# Patient Record
Sex: Female | Born: 1955 | Race: White | Hispanic: No | Marital: Married | Smoking: Never smoker
Health system: Southern US, Community
[De-identification: ages and names within clinical notes are randomized; demographics above are authoritative.]

## PROBLEM LIST (undated history)

## (undated) DIAGNOSIS — F419 Anxiety disorder, unspecified: Secondary | ICD-10-CM

## (undated) DIAGNOSIS — I10 Essential (primary) hypertension: Secondary | ICD-10-CM

## (undated) DIAGNOSIS — Z8659 Personal history of other mental and behavioral disorders: Secondary | ICD-10-CM

## (undated) DIAGNOSIS — G500 Trigeminal neuralgia: Secondary | ICD-10-CM

## (undated) DIAGNOSIS — R079 Chest pain, unspecified: Secondary | ICD-10-CM

## (undated) DIAGNOSIS — K219 Gastro-esophageal reflux disease without esophagitis: Secondary | ICD-10-CM

## (undated) DIAGNOSIS — I209 Angina pectoris, unspecified: Secondary | ICD-10-CM

## (undated) DIAGNOSIS — Z87442 Personal history of urinary calculi: Secondary | ICD-10-CM

## (undated) DIAGNOSIS — E785 Hyperlipidemia, unspecified: Secondary | ICD-10-CM

## (undated) DIAGNOSIS — J45909 Unspecified asthma, uncomplicated: Secondary | ICD-10-CM

## (undated) DIAGNOSIS — I499 Cardiac arrhythmia, unspecified: Secondary | ICD-10-CM

## (undated) DIAGNOSIS — M5126 Other intervertebral disc displacement, lumbar region: Secondary | ICD-10-CM

## (undated) DIAGNOSIS — M75102 Unspecified rotator cuff tear or rupture of left shoulder, not specified as traumatic: Secondary | ICD-10-CM

## (undated) DIAGNOSIS — M1990 Unspecified osteoarthritis, unspecified site: Secondary | ICD-10-CM

## (undated) DIAGNOSIS — E119 Type 2 diabetes mellitus without complications: Secondary | ICD-10-CM

## (undated) HISTORY — PX: BRAIN SURGERY: SHX531

## (undated) HISTORY — DX: Chest pain, unspecified: R07.9

## (undated) HISTORY — PX: CARDIAC CATHETERIZATION: SHX172

## (undated) HISTORY — DX: Other intervertebral disc displacement, lumbar region: M51.26

## (undated) HISTORY — DX: Unspecified rotator cuff tear or rupture of left shoulder, not specified as traumatic: M75.102

## (undated) HISTORY — PX: BACK SURGERY: SHX140

## (undated) HISTORY — DX: Hyperlipidemia, unspecified: E78.5

## (undated) HISTORY — PX: ABDOMINAL HYSTERECTOMY: SHX81

---

## 1997-08-11 HISTORY — PX: OTHER SURGICAL HISTORY: SHX169

## 2003-04-19 HISTORY — PX: LUMBAR LAMINECTOMY/DECOMPRESSION MICRODISCECTOMY: SHX5026

## 2003-05-09 ENCOUNTER — Ambulatory Visit (HOSPITAL_COMMUNITY): Admission: RE | Admit: 2003-05-09 | Discharge: 2003-05-10 | Payer: Self-pay | Admitting: Neurosurgery

## 2003-05-09 ENCOUNTER — Encounter: Payer: Self-pay | Admitting: Neurosurgery

## 2003-10-26 ENCOUNTER — Other Ambulatory Visit: Payer: Self-pay

## 2004-11-21 ENCOUNTER — Ambulatory Visit: Payer: Self-pay | Admitting: Family Medicine

## 2004-12-04 ENCOUNTER — Ambulatory Visit: Payer: Self-pay | Admitting: Unknown Physician Specialty

## 2005-05-26 ENCOUNTER — Ambulatory Visit: Payer: Self-pay | Admitting: Family Medicine

## 2006-08-20 ENCOUNTER — Ambulatory Visit: Payer: Self-pay | Admitting: Unknown Physician Specialty

## 2006-12-23 ENCOUNTER — Ambulatory Visit: Payer: Self-pay | Admitting: Family Medicine

## 2007-01-26 ENCOUNTER — Emergency Department: Payer: Self-pay | Admitting: Emergency Medicine

## 2007-11-16 ENCOUNTER — Ambulatory Visit: Payer: Self-pay

## 2010-02-06 ENCOUNTER — Emergency Department (HOSPITAL_COMMUNITY): Admission: EM | Admit: 2010-02-06 | Discharge: 2010-02-06 | Payer: Self-pay | Admitting: Emergency Medicine

## 2010-02-14 ENCOUNTER — Ambulatory Visit: Payer: Self-pay | Admitting: Family Medicine

## 2010-12-27 NOTE — Op Note (Signed)
NAME:  Amanda Lyons, Amanda Lyons                          ACCOUNT NO.:  [id number]   MEDICAL RECORD NO.:  [id number]                   PATIENT TYPE:  OIB   LOCATION:  2899                                 FACILITY:  [facility]   PHYSICIAN:  Reinaldo Meeker, M.D.              DATE OF BIRTH:  Nov 13, 1955   DATE OF PROCEDURE:  05/09/2003  DATE OF DISCHARGE:                                 OPERATIVE REPORT   PREOPERATIVE DIAGNOSIS:  Herniated disk L5-S1, right.   POSTOPERATIVE DIAGNOSIS:  Herniated disk L5-S1, right.   PROCEDURE:  Right L5-S1 intralaminar laminotomy for excision of herniated  disk with the operating microscope.   SECONDARY PROCEDURE:  Microdissection of L5-S1 disk and S1 nerve root.   SURGEON:  Reinaldo Meeker, M.D.   ASSISTANT:  Kathaleen Maser. Pool, M.D.   DESCRIPTION OF PROCEDURE:  Having been placed in the prone position, the  patient's back was prepped and draped in the usual sterile fashion.  A  localizing x-ray was taken prior to incision, to identify the appropriate  level.  A midline incision was made about the spinous processes of L5 and  S1.  A single curved incision was carried out in the spinous processes.  A  subperiosteal dissection was then carried out on the right side of the  spinous processes and lamina, and the McCullough self-retaining retractor  was placed for exposure.  A second x-ray showed an approach at the  appropriate level.  Using the high-speed drill, the inferior one-third of  the L5 lamina and the medial one-third of the facet joint were removed.  A  drill was then used to remove the superior one-third of the S1 lamina.  Residual bone and ligament of flavum were removed in a piece-meal fashion.  The microscope was draped and brought into the field, and used for the  remainder of the case.  Using a microdissection technique, the lateral  aspect of the thecal sac and the S1 nerve root were identified.  Further  coagulation was carried out down to the floor  of the canal to identify the  L5-S1 disk, which was found to be focally herniated, beneath the nerve root.  After coagulating on the annulus, the annulus was incised with a #15 blade.  Using pituitary rongeurs and curets the right disk space clean-out was  carried out with attention to great care taken to avoid injury to the neural  level which was successfully done.  At this point, inspection was carried  out in all directions without any evidence of residual compression, and none  could be identified.  Large amounts of irrigation were carried out and the  bleeding controlled with bipolar coagulation and Gelfoam.  The wound was  then closed using interrupted Vicryl in the muscle, fascia, subcutaneous and  subcuticular tissues, and staples on the skin.  A sterile dressing was then  applied.   The patient was  then extubated and taken to the recovery room in stable  condition.                                                Reinaldo Meeker, M.D.    ROK/MEDQ  D:  05/09/2003  T:  05/09/2003  Job:  [id number]

## 2013-03-03 ENCOUNTER — Ambulatory Visit: Payer: Self-pay | Admitting: Family Medicine

## 2013-03-24 ENCOUNTER — Ambulatory Visit: Payer: Self-pay | Admitting: Neurosurgery

## 2013-03-30 ENCOUNTER — Other Ambulatory Visit: Payer: Self-pay | Admitting: Neurosurgery

## 2013-04-23 NOTE — Pre-Procedure Instructions (Signed)
Amanda Lyons  04/23/2013   Your procedure is scheduled on:  September 22  Report to [facility] Entrance "A" [address] at [employer] AM.  Call this number if you have problems the morning of surgery:[phone]   Remember:   Do not eat food or drink liquids after midnight.   Take these medicines the morning of surgery with A SIP OF WATER: Omeprazole, Oxycodone (if needed)   Do not take Aspirin, Aleve, Naproxen, Advil, Ibuprofen, Vitamin, Herbs, or Supplements starting today  Do not wear jewelry, make-up or nail polish.  Do not wear lotions, powders, or perfumes. You may wear deodorant.  Do not shave 48 hours prior to surgery. Men may shave face and neck.  Do not bring valuables to the hospital.  [facility] is not responsible                   for any belongings or valuables.  Contacts, dentures or bridgework may not be worn into surgery.  Leave suitcase in the car. After surgery it may be brought to your room.  For patients admitted to the hospital, checkout time is 11:00 AM the day of  discharge.   Patients discharged the day of surgery will not be allowed to drive  home.  Name and phone number of your driver: Family/ Friend  Special Instructions: Shower using CHG 2 nights before surgery and the night before surgery.  If you shower the day of surgery use CHG.  Use special wash - you have one bottle of CHG for all showers.  You should use approximately 1/3 of the bottle for each shower.   Please read over the following fact sheets that you were given: Pain Booklet, Coughing and Deep Breathing and Surgical Site Infection Prevention

## 2013-04-25 ENCOUNTER — Ambulatory Visit (HOSPITAL_COMMUNITY)
Admission: RE | Admit: 2013-04-25 | Discharge: 2013-04-25 | Disposition: A | Discharge status: Home / Self Care | Payer: No Typology Code available for payment source | Source: Ambulatory Visit | Attending: Anesthesiology | Admitting: Anesthesiology

## 2013-04-25 ENCOUNTER — Encounter (HOSPITAL_COMMUNITY)
Admission: RE | Admit: 2013-04-25 | Discharge: 2013-04-25 | Disposition: A | Discharge status: Home / Self Care | Payer: No Typology Code available for payment source | Source: Ambulatory Visit | Attending: Neurosurgery | Admitting: Neurosurgery

## 2013-04-25 ENCOUNTER — Encounter (HOSPITAL_COMMUNITY): Payer: Self-pay

## 2013-04-25 DIAGNOSIS — Z01818 Encounter for other preprocedural examination: Secondary | ICD-10-CM | POA: Insufficient documentation

## 2013-04-25 DIAGNOSIS — Z01810 Encounter for preprocedural cardiovascular examination: Secondary | ICD-10-CM | POA: Insufficient documentation

## 2013-04-25 DIAGNOSIS — Z01812 Encounter for preprocedural laboratory examination: Secondary | ICD-10-CM | POA: Insufficient documentation

## 2013-04-25 HISTORY — DX: Personal history of urinary calculi: Z87.442

## 2013-04-25 HISTORY — DX: Trigeminal neuralgia: G50.0

## 2013-04-25 HISTORY — DX: Essential (primary) hypertension: I10

## 2013-04-25 HISTORY — DX: Personal history of other mental and behavioral disorders: Z86.59

## 2013-04-25 HISTORY — DX: Gastro-esophageal reflux disease without esophagitis: K21.9

## 2013-04-25 LAB — SURGICAL PCR SCREEN: MRSA, PCR: NEGATIVE

## 2013-04-25 LAB — CBC WITH DIFFERENTIAL/PLATELET
Basophils Absolute: 0 10*3/uL (ref 0.0–0.1)
Eosinophils Absolute: 0.1 10*3/uL (ref 0.0–0.7)
Eosinophils Relative: 1 % (ref 0–5)
MCH: 29.5 pg (ref 26.0–34.0)
MCV: 89.3 fL (ref 78.0–100.0)
Platelets: 297 10*3/uL (ref 150–400)
RDW: 13.2 % (ref 11.5–15.5)

## 2013-04-25 LAB — BASIC METABOLIC PANEL
CO2: 29 mEq/L (ref 19–32)
Calcium: 10.1 mg/dL (ref 8.4–10.5)
Creatinine, Ser: 0.64 mg/dL (ref 0.50–1.10)
Glucose, Bld: 137 mg/dL — ABNORMAL HIGH (ref 70–99)

## 2013-05-01 MED ORDER — CEFAZOLIN SODIUM-DEXTROSE 2-3 GM-% IV SOLR
2.0000 g | INTRAVENOUS | Status: AC
  Administered 2013-05-02: 2 g via INTRAVENOUS
  Filled 2013-05-01: qty 50

## 2013-05-02 ENCOUNTER — Encounter (HOSPITAL_COMMUNITY)
Admission: RE | Disposition: A | Discharge status: Home / Self Care | Payer: Self-pay | Source: Ambulatory Visit | Attending: Neurosurgery

## 2013-05-02 ENCOUNTER — Ambulatory Visit (HOSPITAL_COMMUNITY)
Admission: RE | Admit: 2013-05-02 | Discharge: 2013-05-02 | Disposition: A | Discharge status: Home / Self Care | Payer: No Typology Code available for payment source | Source: Ambulatory Visit | Attending: Neurosurgery | Admitting: Neurosurgery

## 2013-05-02 ENCOUNTER — Ambulatory Visit (HOSPITAL_COMMUNITY): Payer: No Typology Code available for payment source | Admitting: Anesthesiology

## 2013-05-02 ENCOUNTER — Encounter (HOSPITAL_COMMUNITY): Payer: Self-pay | Admitting: Anesthesiology

## 2013-05-02 ENCOUNTER — Ambulatory Visit (HOSPITAL_COMMUNITY): Payer: No Typology Code available for payment source

## 2013-05-02 ENCOUNTER — Encounter (HOSPITAL_COMMUNITY): Payer: Self-pay | Admitting: *Deleted

## 2013-05-02 DIAGNOSIS — M5126 Other intervertebral disc displacement, lumbar region: Secondary | ICD-10-CM

## 2013-05-02 DIAGNOSIS — I10 Essential (primary) hypertension: Secondary | ICD-10-CM | POA: Insufficient documentation

## 2013-05-02 DIAGNOSIS — E7800 Pure hypercholesterolemia, unspecified: Secondary | ICD-10-CM | POA: Insufficient documentation

## 2013-05-02 HISTORY — PX: LUMBAR LAMINECTOMY/DECOMPRESSION MICRODISCECTOMY: SHX5026

## 2013-05-02 HISTORY — DX: Other intervertebral disc displacement, lumbar region: M51.26

## 2013-05-02 SURGERY — LUMBAR LAMINECTOMY/DECOMPRESSION MICRODISCECTOMY 1 LEVEL
Anesthesia: General | Site: Back | Laterality: Right | Wound class: Clean

## 2013-05-02 MED ORDER — PROPOFOL 10 MG/ML IV BOLUS
INTRAVENOUS | Status: DC | PRN
  Administered 2013-05-02: 80 mg via INTRAVENOUS

## 2013-05-02 MED ORDER — HYDROCHLOROTHIAZIDE 12.5 MG PO CAPS
12.5000 mg | ORAL_CAPSULE | Freq: Every day | ORAL | Status: DC
  Administered 2013-05-02: 12.5 mg via ORAL
  Filled 2013-05-02: qty 1

## 2013-05-02 MED ORDER — OXYCODONE HCL 5 MG PO TABS: 5.0000 mg | ORAL_TABLET | Freq: Once | ORAL | Status: DC | PRN

## 2013-05-02 MED ORDER — ARTIFICIAL TEARS OP OINT
TOPICAL_OINTMENT | OPHTHALMIC | Status: DC | PRN
  Administered 2013-05-02: 1 via OPHTHALMIC

## 2013-05-02 MED ORDER — OXYCODONE HCL 5 MG/5ML PO SOLN: 5.0000 mg | Freq: Once | ORAL | Status: DC | PRN

## 2013-05-02 MED ORDER — BUPIVACAINE HCL (PF) 0.25 % IJ SOLN
INTRAMUSCULAR | Status: DC | PRN
  Administered 2013-05-02: 10 mL

## 2013-05-02 MED ORDER — OXYCODONE-ACETAMINOPHEN 5-325 MG PO TABS: 0.5000 | ORAL_TABLET | ORAL | Status: DC | PRN

## 2013-05-02 MED ORDER — NEOSTIGMINE METHYLSULFATE 1 MG/ML IJ SOLN
INTRAMUSCULAR | Status: DC | PRN
  Administered 2013-05-02: 3 mg via INTRAVENOUS

## 2013-05-02 MED ORDER — PANTOPRAZOLE SODIUM 40 MG PO TBEC: 40.0000 mg | DELAYED_RELEASE_TABLET | Freq: Every day | ORAL | Status: DC

## 2013-05-02 MED ORDER — CEFAZOLIN SODIUM 1-5 GM-% IV SOLN
1.0000 g | Freq: Three times a day (TID) | INTRAVENOUS | Status: DC
  Administered 2013-05-02: 1 g via INTRAVENOUS
  Filled 2013-05-02 (×2): qty 50

## 2013-05-02 MED ORDER — FENTANYL CITRATE 0.05 MG/ML IJ SOLN
INTRAMUSCULAR | Status: DC | PRN
  Administered 2013-05-02: 50 ug via INTRAVENOUS
  Administered 2013-05-02: 100 ug via INTRAVENOUS

## 2013-05-02 MED ORDER — DEXAMETHASONE SODIUM PHOSPHATE 10 MG/ML IJ SOLN: 10.0000 mg | INTRAMUSCULAR | Status: DC

## 2013-05-02 MED ORDER — THROMBIN 5000 UNITS EX SOLR
CUTANEOUS | Status: DC | PRN
  Administered 2013-05-02 (×2): 5000 [IU] via TOPICAL

## 2013-05-02 MED ORDER — BENAZEPRIL HCL 20 MG PO TABS
20.0000 mg | ORAL_TABLET | Freq: Every day | ORAL | Status: DC
  Administered 2013-05-02: 20 mg via ORAL
  Filled 2013-05-02: qty 1

## 2013-05-02 MED ORDER — PHENOL 1.4 % MT LIQD
1.0000 | OROMUCOSAL | Status: DC | PRN
  Administered 2013-05-02: 1 via OROMUCOSAL
  Filled 2013-05-02: qty 177

## 2013-05-02 MED ORDER — SODIUM CHLORIDE 0.9 % IV SOLN: 250.0000 mL | INTRAVENOUS | Status: DC

## 2013-05-02 MED ORDER — HYDROMORPHONE HCL PF 1 MG/ML IJ SOLN
0.2500 mg | INTRAMUSCULAR | Status: DC | PRN
  Administered 2013-05-02 (×2): 0.5 mg via INTRAVENOUS

## 2013-05-02 MED ORDER — KETOROLAC TROMETHAMINE 30 MG/ML IJ SOLN
INTRAMUSCULAR | Status: DC | PRN
  Administered 2013-05-02: 30 mg via INTRAVENOUS

## 2013-05-02 MED ORDER — LACTATED RINGERS IV SOLN
INTRAVENOUS | Status: DC | PRN
  Administered 2013-05-02 (×2): via INTRAVENOUS

## 2013-05-02 MED ORDER — SODIUM CHLORIDE 0.9 % IJ SOLN: 3.0000 mL | INTRAMUSCULAR | Status: DC | PRN

## 2013-05-02 MED ORDER — GLYCOPYRROLATE 0.2 MG/ML IJ SOLN
INTRAMUSCULAR | Status: DC | PRN
  Administered 2013-05-02: 0.6 mg via INTRAVENOUS

## 2013-05-02 MED ORDER — SODIUM CHLORIDE 0.9 % IR SOLN
Status: DC | PRN
  Administered 2013-05-02: 08:00:00

## 2013-05-02 MED ORDER — SIMVASTATIN 20 MG PO TABS
20.0000 mg | ORAL_TABLET | Freq: Every day | ORAL | Status: DC
  Filled 2013-05-02: qty 1

## 2013-05-02 MED ORDER — ALUM & MAG HYDROXIDE-SIMETH 200-200-20 MG/5ML PO SUSP: 30.0000 mL | Freq: Four times a day (QID) | ORAL | Status: DC | PRN

## 2013-05-02 MED ORDER — PROMETHAZINE HCL 12.5 MG PO TABS: 12.5000 mg | ORAL_TABLET | Freq: Four times a day (QID) | ORAL | Status: DC | PRN

## 2013-05-02 MED ORDER — OXYCODONE-ACETAMINOPHEN 5-325 MG PO TABS
1.0000 | ORAL_TABLET | ORAL | Status: DC | PRN
  Administered 2013-05-02: 2 via ORAL
  Filled 2013-05-02: qty 2

## 2013-05-02 MED ORDER — SODIUM CHLORIDE 0.9 % IJ SOLN: 3.0000 mL | Freq: Two times a day (BID) | INTRAMUSCULAR | Status: DC

## 2013-05-02 MED ORDER — HYDROMORPHONE HCL PF 1 MG/ML IJ SOLN
INTRAMUSCULAR | Status: AC
  Filled 2013-05-02: qty 1

## 2013-05-02 MED ORDER — BENAZEPRIL-HYDROCHLOROTHIAZIDE 20-12.5 MG PO TABS
1.0000 | ORAL_TABLET | Freq: Every day | ORAL | Status: DC
Start: 2013-05-02 — End: 2013-05-02

## 2013-05-02 MED ORDER — HEMOSTATIC AGENTS (NO CHARGE) OPTIME
TOPICAL | Status: DC | PRN
  Administered 2013-05-02: 1 via TOPICAL

## 2013-05-02 MED ORDER — ONDANSETRON HCL 4 MG/2ML IJ SOLN: 4.0000 mg | Freq: Four times a day (QID) | INTRAMUSCULAR | Status: DC | PRN

## 2013-05-02 MED ORDER — ACETAMINOPHEN 650 MG RE SUPP: 650.0000 mg | RECTAL | Status: DC | PRN

## 2013-05-02 MED ORDER — ONDANSETRON HCL 4 MG/2ML IJ SOLN
INTRAMUSCULAR | Status: DC | PRN
  Administered 2013-05-02: 4 mg via INTRAVENOUS

## 2013-05-02 MED ORDER — SENNA 8.6 MG PO TABS: 1.0000 | ORAL_TABLET | Freq: Two times a day (BID) | ORAL | Status: DC

## 2013-05-02 MED ORDER — HYDROCODONE-ACETAMINOPHEN 5-325 MG PO TABS: 1.0000 | ORAL_TABLET | ORAL | Status: DC | PRN

## 2013-05-02 MED ORDER — ACETAMINOPHEN 325 MG PO TABS: 650.0000 mg | ORAL_TABLET | ORAL | Status: DC | PRN

## 2013-05-02 MED ORDER — 0.9 % SODIUM CHLORIDE (POUR BTL) OPTIME
TOPICAL | Status: DC | PRN
  Administered 2013-05-02: 1000 mL

## 2013-05-02 MED ORDER — CYCLOBENZAPRINE HCL 10 MG PO TABS: 10.0000 mg | ORAL_TABLET | Freq: Three times a day (TID) | ORAL | Status: DC | PRN

## 2013-05-02 MED ORDER — MENTHOL 3 MG MT LOZG
1.0000 | LOZENGE | OROMUCOSAL | Status: DC | PRN
  Filled 2013-05-02: qty 9

## 2013-05-02 MED ORDER — ONDANSETRON HCL 4 MG/2ML IJ SOLN
4.0000 mg | INTRAMUSCULAR | Status: DC | PRN
  Administered 2013-05-02 (×2): 4 mg via INTRAVENOUS
  Filled 2013-05-02 (×2): qty 2

## 2013-05-02 MED ORDER — MIDAZOLAM HCL 5 MG/5ML IJ SOLN
INTRAMUSCULAR | Status: DC | PRN
  Administered 2013-05-02: 2 mg via INTRAVENOUS

## 2013-05-02 MED ORDER — LIDOCAINE HCL (CARDIAC) 20 MG/ML IV SOLN
INTRAVENOUS | Status: DC | PRN
  Administered 2013-05-02: 80 mg via INTRAVENOUS

## 2013-05-02 MED ORDER — HYDROMORPHONE HCL PF 1 MG/ML IJ SOLN
0.5000 mg | INTRAMUSCULAR | Status: DC | PRN
  Administered 2013-05-02: 1 mg via INTRAVENOUS
  Filled 2013-05-02: qty 1

## 2013-05-02 MED ORDER — ROCURONIUM BROMIDE 100 MG/10ML IV SOLN
INTRAVENOUS | Status: DC | PRN
  Administered 2013-05-02: 50 mg via INTRAVENOUS

## 2013-05-02 MED ORDER — DEXAMETHASONE SODIUM PHOSPHATE 10 MG/ML IJ SOLN
INTRAMUSCULAR | Status: AC
  Administered 2013-05-02: 10 mg via INTRAVENOUS
  Filled 2013-05-02: qty 1

## 2013-05-02 MED ORDER — CYCLOBENZAPRINE HCL 10 MG PO TABS
10.0000 mg | ORAL_TABLET | Freq: Three times a day (TID) | ORAL | Status: DC | PRN
  Administered 2013-05-02: 10 mg via ORAL
  Filled 2013-05-02: qty 1

## 2013-05-02 SURGICAL SUPPLY — 54 items
BAG DECANTER FOR FLEXI CONT (MISCELLANEOUS) ×2 IMPLANT
BENZOIN TINCTURE PRP APPL 2/3 (GAUZE/BANDAGES/DRESSINGS) ×2 IMPLANT
BLADE SURG ROTATE 9660 (MISCELLANEOUS) IMPLANT
BRUSH SCRUB EZ PLAIN DRY (MISCELLANEOUS) ×2 IMPLANT
BUR CUTTER 7.0 ROUND (BURR) ×2 IMPLANT
CANISTER SUCTION 2500CC (MISCELLANEOUS) ×2 IMPLANT
CLOTH BEACON ORANGE TIMEOUT ST (SAFETY) ×2 IMPLANT
CONT SPEC 4OZ CLIKSEAL STRL BL (MISCELLANEOUS) ×2 IMPLANT
DECANTER SPIKE VIAL GLASS SM (MISCELLANEOUS) ×2 IMPLANT
DERMABOND ADHESIVE PROPEN (GAUZE/BANDAGES/DRESSINGS) ×1
DERMABOND ADVANCED (GAUZE/BANDAGES/DRESSINGS) ×1
DERMABOND ADVANCED .7 DNX12 (GAUZE/BANDAGES/DRESSINGS) ×1 IMPLANT
DERMABOND ADVANCED .7 DNX6 (GAUZE/BANDAGES/DRESSINGS) ×1 IMPLANT
DRAPE LAPAROTOMY 100X72X124 (DRAPES) ×2 IMPLANT
DRAPE MICROSCOPE ZEISS OPMI (DRAPES) ×2 IMPLANT
DRAPE POUCH INSTRU U-SHP 10X18 (DRAPES) ×2 IMPLANT
DRAPE PROXIMA HALF (DRAPES) IMPLANT
DRAPE SURG 17X23 STRL (DRAPES) ×4 IMPLANT
DRSG OPSITE 4X5.5 SM (GAUZE/BANDAGES/DRESSINGS) ×2 IMPLANT
DURAPREP 26ML APPLICATOR (WOUND CARE) ×2 IMPLANT
ELECT REM PT RETURN 9FT ADLT (ELECTROSURGICAL) ×2
ELECTRODE REM PT RTRN 9FT ADLT (ELECTROSURGICAL) ×1 IMPLANT
GAUZE SPONGE 4X4 16PLY XRAY LF (GAUZE/BANDAGES/DRESSINGS) IMPLANT
GLOVE BIO SURGEON STRL SZ8 (GLOVE) ×2 IMPLANT
GLOVE BIOGEL PI IND STRL 7.5 (GLOVE) ×1 IMPLANT
GLOVE BIOGEL PI INDICATOR 7.5 (GLOVE) ×1
GLOVE ECLIPSE 8.5 STRL (GLOVE) ×2 IMPLANT
GLOVE EXAM NITRILE LRG STRL (GLOVE) ×2 IMPLANT
GLOVE EXAM NITRILE MD LF STRL (GLOVE) IMPLANT
GLOVE EXAM NITRILE XL STR (GLOVE) IMPLANT
GLOVE EXAM NITRILE XS STR PU (GLOVE) IMPLANT
GLOVE INDICATOR 8.5 STRL (GLOVE) ×2 IMPLANT
GLOVE SURG SS PI 7.0 STRL IVOR (GLOVE) ×4 IMPLANT
GOWN BRE IMP SLV AUR LG STRL (GOWN DISPOSABLE) IMPLANT
GOWN BRE IMP SLV AUR XL STRL (GOWN DISPOSABLE) ×6 IMPLANT
GOWN STRL REIN 2XL LVL4 (GOWN DISPOSABLE) IMPLANT
KIT BASIN OR (CUSTOM PROCEDURE TRAY) ×2 IMPLANT
KIT ROOM TURNOVER OR (KITS) ×2 IMPLANT
NEEDLE HYPO 22GX1.5 SAFETY (NEEDLE) ×2 IMPLANT
NEEDLE SPNL 22GX3.5 QUINCKE BK (NEEDLE) ×2 IMPLANT
NS IRRIG 1000ML POUR BTL (IV SOLUTION) ×2 IMPLANT
PACK LAMINECTOMY NEURO (CUSTOM PROCEDURE TRAY) ×2 IMPLANT
PAD ARMBOARD 7.5X6 YLW CONV (MISCELLANEOUS) ×6 IMPLANT
RUBBERBAND STERILE (MISCELLANEOUS) ×4 IMPLANT
SPONGE GAUZE 4X4 12PLY (GAUZE/BANDAGES/DRESSINGS) ×2 IMPLANT
SPONGE SURGIFOAM ABS GEL SZ50 (HEMOSTASIS) ×2 IMPLANT
STRIP CLOSURE SKIN 1/2X4 (GAUZE/BANDAGES/DRESSINGS) ×2 IMPLANT
SUT VIC AB 2-0 CT1 18 (SUTURE) ×2 IMPLANT
SUT VIC AB 3-0 SH 8-18 (SUTURE) ×2 IMPLANT
SYR 20ML ECCENTRIC (SYRINGE) ×2 IMPLANT
TAPE CLOTH SURG 4X10 WHT LF (GAUZE/BANDAGES/DRESSINGS) ×2 IMPLANT
TOWEL OR 17X24 6PK STRL BLUE (TOWEL DISPOSABLE) ×2 IMPLANT
TOWEL OR 17X26 10 PK STRL BLUE (TOWEL DISPOSABLE) ×2 IMPLANT
WATER STERILE IRR 1000ML POUR (IV SOLUTION) ×2 IMPLANT
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
## 2013-05-02 NOTE — Anesthesia Procedure Notes (Signed)
Procedure Name: Intubation Date/Time: 05/02/2013 7:51 AM Performed by: Carmela Rima Pre-anesthesia Checklist: Patient identified, Timeout performed, Emergency Drugs available, Suction available and Patient being monitored Patient Re-evaluated:Patient Re-evaluated prior to inductionOxygen Delivery Method: Circle system utilized Preoxygenation: Pre-oxygenation with 100% oxygen Intubation Type: IV induction Ventilation: Mask ventilation without difficulty Laryngoscope Size: Mac and 3 Grade View: Grade II Tube type: Oral Tube size: 7.5 mm Number of attempts: 1 Placement Confirmation: ETT inserted through vocal cords under direct vision,  positive ETCO2 and breath sounds checked- equal and bilateral Secured at: 23 cm Tube secured with: Tape Dental Injury: Teeth and Oropharynx as per pre-operative assessment

## 2013-05-02 NOTE — Brief Op Note (Signed)
05/02/2013  8:55 AM  PATIENT:  Amanda Lyons  57 y.o. female  PRE-OPERATIVE DIAGNOSIS:  Herniated Nucleus Pulposus  POST-OPERATIVE DIAGNOSIS:  Herniated Nucleus Pulposus  PROCEDURE:  Procedure(s) with comments: Right Lumbar Five Sacral One Microdiskectomy (Right) - LUMBAR LAMINECTOMY/DECOMPRESSION MICRODISCECTOMY 1 LEVEL  SURGEON:  Surgeon(s) and Role:    * Temple Pacini, MD - Primary  PHYSICIAN ASSISTANT:   ASSISTANTS:    ANESTHESIA:   general  EBL:  Total I/O In: 1000 [I.V.:1000] Out: 100 [Blood:100]  BLOOD ADMINISTERED:none  DRAINS: none   LOCAL MEDICATIONS USED:  MARCAINE     SPECIMEN:  No Specimen  DISPOSITION OF SPECIMEN:  N/A  COUNTS:  YES  TOURNIQUET:  * No tourniquets in log *  DICTATION: .Dragon Dictation  PLAN OF CARE: Admit for overnight observation  PATIENT DISPOSITION:  PACU - hemodynamically stable.   Delay start of Pharmacological VTE agent (>24hrs) due to surgical blood loss or risk of bleeding: yes

## 2013-05-02 NOTE — Progress Notes (Signed)
Pt and husband given D/C instructions with Rx's, verbal understanding was given. Pt D/C'd home via wheelchair @ 1810 per MD order. Pt stable @ D/C. Rema Fendt, RN

## 2013-05-02 NOTE — Preoperative (Signed)
Beta Blockers   Reason not to administer Beta Blockers:Not Applicable 

## 2013-05-02 NOTE — Anesthesia Postprocedure Evaluation (Signed)
Anesthesia Post Note  Patient: Amanda Lyons  Procedure(s) Performed: Procedure(s) (LRB): Right Lumbar Five Sacral One Microdiskectomy (Right)  Anesthesia type: General  Patient location: PACU  Post pain: Pain level controlled and Adequate analgesia  Post assessment: Post-op Vital signs reviewed, Patient's Cardiovascular Status Stable, Respiratory Function Stable, Patent Airway and Pain level controlled  Last Vitals:  Filed Vitals:   05/02/13 1000  BP:   Pulse: 62  Temp:   Resp: 14    Post vital signs: Reviewed and stable  Level of consciousness: awake, alert  and oriented  Complications: No apparent anesthesia complications

## 2013-05-02 NOTE — Transfer of Care (Signed)
Immediate Anesthesia Transfer of Care Note  Patient: Amanda Lyons  Procedure(s) Performed: Procedure(s) with comments: Right Lumbar Five Sacral One Microdiskectomy (Right) - LUMBAR LAMINECTOMY/DECOMPRESSION MICRODISCECTOMY 1 LEVEL  Patient Location: PACU  Anesthesia Type:General  Level of Consciousness: awake  Airway & Oxygen Therapy: Patient Spontanous Breathing and Patient connected to nasal cannula oxygen  Post-op Assessment: Report given to PACU RN, Post -op Vital signs reviewed and stable and Patient moving all extremities X 4  Post vital signs: Reviewed and stable  Complications: No apparent anesthesia complications

## 2013-05-02 NOTE — Discharge Summary (Signed)
Physician Discharge Summary  Patient ID: Amanda Lyons MRN: [id number] DOB/AGE: 55-03-1956 55 y.o.  Admit date: 05/02/2013 Discharge date: 05/02/2013  Admission Diagnoses:  Discharge Diagnoses:  Principal Problem:   HNP (herniated nucleus pulposus), lumbar   Discharged Condition: good  Hospital Course: Patient admitted to the hospital where she underwent uncomplicated lumbar redo microdiscectomy. Postoperative she is done very well. Back and leg pain improved. Up and ambulatory. Ready for discharge home.  Consults:   Significant Diagnostic Studies:   Treatments:   Discharge Exam: Blood pressure 112/73, pulse 69, temperature 97.5 F (36.4 C), temperature source Oral, resp. rate 16, SpO2 97.00%. Awake and alert. Oriented and appropriate. Cranial nerve function intact. Motor and sensory function extremities normal. Wound clean and dry.  Disposition:      Medication List         benazepril-hydrochlorthiazide 20-12.5 MG per tablet  Commonly known as:  LOTENSIN HCT  Take 1 tablet by mouth daily.     cyclobenzaprine 10 MG tablet  Commonly known as:  FLEXERIL  Take 1 tablet (10 mg total) by mouth 3 (three) times daily as needed for muscle spasms.     omeprazole 20 MG capsule  Commonly known as:  PRILOSEC  Take 20 mg by mouth daily.     oxyCODONE-acetaminophen 5-325 MG per tablet  Commonly known as:  PERCOCET/ROXICET  Take 0.5-1 tablets by mouth every 4 (four) hours as needed for pain.     promethazine 12.5 MG tablet  Commonly known as:  PHENERGAN  Take 1 tablet (12.5 mg total) by mouth every 6 (six) hours as needed for nausea.     simvastatin 20 MG tablet  Commonly known as:  ZOCOR  Take 20 mg by mouth at bedtime.           Follow-up Information   Follow up with Temple Pacini, MD.   Specialty:  Neurosurgery   Contact information:   [address] [place] [address] [phone]       Signed: Julio Sicks A 05/02/2013, 5:16 PM

## 2013-05-02 NOTE — H&P (Signed)
Amanda Lyons is an 57 y.o. female.   Chief Complaint: Back and right leg pain HPI: 57 year old woman with a remote history of a right-sided L5-S1 laminotomy and discectomy presents with recurrent back and right lower extremity pain and paresthesias with some weakness. Workup demonstrates evidence of a central recurrent disc herniation at L5-S1 with marked compression of thecal sac and some compression of the S1 nerve roots bilaterally. Patient's failed conservative management and presents now for laminotomy and redo microdiscectomy in hopes of improving her symptoms.  Past Medical History  Diagnosis Date  . Hypertension   . GERD (gastroesophageal reflux disease)   . Hypercholesteremia   . History of anxiety     had anxiety attacks  . History of kidney stones   . Trigeminal neuralgia     Past Surgical History  Procedure Laterality Date  . Abdominal hysterectomy    . Gamma knife  1999    for trigeminal neuralgia  . Brain surgery      for trigeminal neuralgia  . Back surgery      fixed slipped disk    History reviewed. No pertinent family history. Social History:  reports that she has never smoked. She does not have any smokeless tobacco history on file. She reports that she does not drink alcohol or use illicit drugs.  Allergies:  Allergies  Allergen Reactions  . Prednisone Nausea And Vomiting  . Sulfa Antibiotics Nausea And Vomiting    Medications Prior to Admission  Medication Sig Dispense Refill  . benazepril-hydrochlorthiazide (LOTENSIN HCT) 20-12.5 MG per tablet Take 1 tablet by mouth daily.      Marland Kitchen omeprazole (PRILOSEC) 20 MG capsule Take 20 mg by mouth daily.      Marland Kitchen oxyCODONE-acetaminophen (PERCOCET/ROXICET) 5-325 MG per tablet Take 0.5-1 tablets by mouth every 4 (four) hours as needed for pain.      . simvastatin (ZOCOR) 20 MG tablet Take 20 mg by mouth at bedtime.        No results found for this or any previous visit (from the past 48 hour(s)). No results  found.  Review of Systems  Constitutional: Negative.   HENT: Negative.   Eyes: Negative.   Respiratory: Negative.   Cardiovascular: Negative.   Gastrointestinal: Negative.   Genitourinary: Negative.   Musculoskeletal: Negative.   Skin: Negative.   Neurological: Negative.   Endo/Heme/Allergies: Negative.   Psychiatric/Behavioral: Negative.     Blood pressure 147/78, pulse 76, temperature 97.1 F (36.2 C), temperature source Oral, resp. rate 16, SpO2 95.00%. Physical Exam  Constitutional: She is oriented to person, place, and time. She appears well-developed and well-nourished. No distress.  HENT:  Head: Normocephalic and atraumatic.  Right Ear: External ear normal.  Left Ear: External ear normal.  Nose: Nose normal.  Mouth/Throat: Oropharynx is clear and moist.  Eyes: Conjunctivae and EOM are normal. Pupils are equal, round, and reactive to light.  Neck: Normal range of motion. Neck supple. No tracheal deviation present. No thyromegaly present.  Cardiovascular: Normal rate, regular rhythm, normal heart sounds and intact distal pulses.   No murmur heard. Respiratory: Effort normal and breath sounds normal. No respiratory distress. She has no wheezes.  GI: Soft. Bowel sounds are normal. She exhibits no distension. There is no tenderness.  Musculoskeletal: Normal range of motion. She exhibits no edema and no tenderness.  Neurological: She is alert and oriented to person, place, and time. She has normal reflexes. No cranial nerve deficit. Coordination normal.  Skin: Skin is warm and dry.  She is not diaphoretic.  Psychiatric: She has a normal mood and affect. Her behavior is normal. Judgment and thought content normal.     Assessment/Plan Right/central recurrent herniated nucleus pulposus with radiculopathy. Plan reexploration of right L5-S1 laminotomy with redo microdiscectomy. Risks and benefits been explained. Patient wishes to proceed.  Valon Glasscock A 05/02/2013, 7:42 AM

## 2013-05-02 NOTE — Anesthesia Preprocedure Evaluation (Addendum)
Anesthesia Evaluation  Patient identified by MRN, date of birth, ID band Patient awake    Reviewed: Allergy & Precautions, H&P , NPO status , Patient's Chart, lab work & pertinent test results  Airway Mallampati: II  Neck ROM: full    Dental  (+) Teeth Intact and Dental Advidsory Given   Pulmonary          Cardiovascular hypertension,     Neuro/Psych  Neuromuscular disease    GI/Hepatic GERD-  ,  Endo/Other    Renal/GU      Musculoskeletal   Abdominal   Peds  Hematology   Anesthesia Other Findings   Reproductive/Obstetrics                          Anesthesia Physical Anesthesia Plan  ASA: II  Anesthesia Plan: General   Post-op Pain Management:    Induction: Intravenous  Airway Management Planned: Oral ETT  Additional Equipment:   Intra-op Plan:   Post-operative Plan: Extubation in OR  Informed Consent: I have reviewed the patients History and Physical, chart, labs and discussed the procedure including the risks, benefits and alternatives for the proposed anesthesia with the patient or authorized representative who has indicated his/her understanding and acceptance.   Dental Advisory Given  Plan Discussed with: CRNA, Anesthesiologist and Surgeon  Anesthesia Plan Comments:        Anesthesia Quick Evaluation

## 2013-05-03 ENCOUNTER — Encounter (HOSPITAL_COMMUNITY): Payer: Self-pay | Admitting: Neurosurgery

## 2013-11-08 ENCOUNTER — Emergency Department (HOSPITAL_COMMUNITY): Payer: No Typology Code available for payment source

## 2013-11-08 ENCOUNTER — Observation Stay (HOSPITAL_COMMUNITY)
Admission: EM | Admit: 2013-11-08 | Discharge: 2013-11-09 | Disposition: A | Discharge status: Home / Self Care | Payer: No Typology Code available for payment source | Attending: Internal Medicine | Admitting: Internal Medicine

## 2013-11-08 ENCOUNTER — Encounter (HOSPITAL_COMMUNITY): Payer: Self-pay | Admitting: Emergency Medicine

## 2013-11-08 DIAGNOSIS — Z8669 Personal history of other diseases of the nervous system and sense organs: Secondary | ICD-10-CM | POA: Insufficient documentation

## 2013-11-08 DIAGNOSIS — Z87442 Personal history of urinary calculi: Secondary | ICD-10-CM | POA: Insufficient documentation

## 2013-11-08 DIAGNOSIS — M5126 Other intervertebral disc displacement, lumbar region: Secondary | ICD-10-CM | POA: Insufficient documentation

## 2013-11-08 DIAGNOSIS — K219 Gastro-esophageal reflux disease without esophagitis: Secondary | ICD-10-CM | POA: Insufficient documentation

## 2013-11-08 DIAGNOSIS — R079 Chest pain, unspecified: Secondary | ICD-10-CM

## 2013-11-08 DIAGNOSIS — I10 Essential (primary) hypertension: Secondary | ICD-10-CM | POA: Insufficient documentation

## 2013-11-08 DIAGNOSIS — E785 Hyperlipidemia, unspecified: Secondary | ICD-10-CM

## 2013-11-08 DIAGNOSIS — R51 Headache: Secondary | ICD-10-CM | POA: Insufficient documentation

## 2013-11-08 DIAGNOSIS — R0789 Other chest pain: Principal | ICD-10-CM | POA: Insufficient documentation

## 2013-11-08 DIAGNOSIS — Z79899 Other long term (current) drug therapy: Secondary | ICD-10-CM | POA: Insufficient documentation

## 2013-11-08 DIAGNOSIS — Z9889 Other specified postprocedural states: Secondary | ICD-10-CM | POA: Insufficient documentation

## 2013-11-08 DIAGNOSIS — R0602 Shortness of breath: Secondary | ICD-10-CM | POA: Insufficient documentation

## 2013-11-08 HISTORY — DX: Hyperlipidemia, unspecified: E78.5

## 2013-11-08 HISTORY — DX: Chest pain, unspecified: R07.9

## 2013-11-08 LAB — CBC
HCT: 42.2 % (ref 36.0–46.0)
HEMOGLOBIN: 14.3 g/dL (ref 12.0–15.0)
MCH: 30 pg (ref 26.0–34.0)
MCHC: 33.9 g/dL (ref 30.0–36.0)
MCV: 88.5 fL (ref 78.0–100.0)
PLATELETS: 320 10*3/uL (ref 150–400)
RBC: 4.77 MIL/uL (ref 3.87–5.11)
RDW: 13.1 % (ref 11.5–15.5)
WBC: 8.8 10*3/uL (ref 4.0–10.5)

## 2013-11-08 LAB — BASIC METABOLIC PANEL
BUN: 19 mg/dL (ref 6–23)
CO2: 26 mEq/L (ref 19–32)
Calcium: 10 mg/dL (ref 8.4–10.5)
Chloride: 100 mEq/L (ref 96–112)
Creatinine, Ser: 0.63 mg/dL (ref 0.50–1.10)
GFR calc Af Amer: >90 mL/min (ref >90)
GFR calc non Af Amer: >90 mL/min (ref >90)
GLUCOSE: 114 mg/dL — AB (ref 70–99)
POTASSIUM: 3.8 meq/L (ref 3.7–5.3)
Sodium: 141 mEq/L (ref 137–147)

## 2013-11-08 LAB — TROPONIN I: Troponin I: <0.3 ng/mL (ref <0.30)

## 2013-11-08 LAB — I-STAT TROPONIN, ED: TROPONIN I, POC: 0.01 ng/mL (ref 0.00–0.08)

## 2013-11-08 MED ORDER — NITROGLYCERIN 2 % TD OINT
1.0000 [in_us] | TOPICAL_OINTMENT | Freq: Once | TRANSDERMAL | Status: DC
  Administered 2013-11-08: 1 [in_us] via TOPICAL
  Filled 2013-11-08: qty 1

## 2013-11-08 MED ORDER — SIMVASTATIN 20 MG PO TABS
20.0000 mg | ORAL_TABLET | Freq: Every day | ORAL | Status: DC
  Administered 2013-11-09: 20 mg via ORAL
  Filled 2013-11-08: qty 1

## 2013-11-08 MED ORDER — HEPARIN (PORCINE) IN NACL 100-0.45 UNIT/ML-% IJ SOLN
1150.0000 [IU]/h | INTRAMUSCULAR | Status: DC
  Administered 2013-11-08: 1150 [IU]/h via INTRAVENOUS
  Filled 2013-11-08 (×2): qty 250

## 2013-11-08 MED ORDER — NITROGLYCERIN 0.4 MG SL SUBL
0.4000 mg | SUBLINGUAL_TABLET | SUBLINGUAL | Status: DC | PRN
  Administered 2013-11-08: 0.4 mg via SUBLINGUAL

## 2013-11-08 MED ORDER — ONDANSETRON HCL 4 MG/2ML IJ SOLN: 4.0000 mg | Freq: Four times a day (QID) | INTRAMUSCULAR | Status: DC | PRN

## 2013-11-08 MED ORDER — LISINOPRIL 10 MG PO TABS
10.0000 mg | ORAL_TABLET | Freq: Every day | ORAL | Status: DC
  Administered 2013-11-08 – 2013-11-09 (×2): 10 mg via ORAL
  Filled 2013-11-08 (×2): qty 1

## 2013-11-08 MED ORDER — NITROGLYCERIN 2 % TD OINT: 0.5000 [in_us] | TOPICAL_OINTMENT | Freq: Three times a day (TID) | TRANSDERMAL | Status: DC

## 2013-11-08 MED ORDER — ASPIRIN EC 81 MG PO TBEC
81.0000 mg | DELAYED_RELEASE_TABLET | Freq: Every day | ORAL | Status: DC
  Administered 2013-11-09: 81 mg via ORAL
  Filled 2013-11-08: qty 1

## 2013-11-08 MED ORDER — ASPIRIN 325 MG PO TABS
325.0000 mg | ORAL_TABLET | Freq: Once | ORAL | Status: AC
  Administered 2013-11-08: 325 mg via ORAL
  Filled 2013-11-08: qty 1

## 2013-11-08 MED ORDER — PANTOPRAZOLE SODIUM 40 MG PO TBEC
40.0000 mg | DELAYED_RELEASE_TABLET | Freq: Every day | ORAL | Status: DC
  Administered 2013-11-08 – 2013-11-09 (×2): 40 mg via ORAL
  Filled 2013-11-08 (×2): qty 1

## 2013-11-08 MED ORDER — CARVEDILOL 6.25 MG PO TABS
6.2500 mg | ORAL_TABLET | Freq: Two times a day (BID) | ORAL | Status: DC
  Administered 2013-11-08: 6.25 mg via ORAL
  Filled 2013-11-08 (×4): qty 1

## 2013-11-08 MED ORDER — MORPHINE SULFATE 2 MG/ML IJ SOLN: 2.0000 mg | INTRAMUSCULAR | Status: DC | PRN

## 2013-11-08 MED ORDER — HEPARIN BOLUS VIA INFUSION
4000.0000 [IU] | Freq: Once | INTRAVENOUS | Status: AC
  Administered 2013-11-08: 4000 [IU] via INTRAVENOUS
  Filled 2013-11-08: qty 4000

## 2013-11-08 NOTE — Progress Notes (Signed)
ANTICOAGULATION CONSULT NOTE - Initial Consult  Pharmacy Consult for Heparin Indication: chest pain/ACS  Allergies  Allergen Reactions  . Prednisone Nausea And Vomiting  . Sulfa Antibiotics Nausea And Vomiting   Patient Measurements: Weight (as of 04/2013) = 76.5kg Height (as of 04/2013) = 160 cm Heparin Dosing Weight: 70 kg  Vital Signs: Temp: 98.5 F (36.9 C) (03/31 1629) Temp src: Oral (03/31 1629) BP: 139/78 mmHg (03/31 1830) Pulse Rate: 75 (03/31 1830)  Labs:  Recent Labs  11/08/13 1638  HGB 14.3  HCT 42.2  PLT 320  CREATININE 0.63   The CrCl is unknown because both a height and weight (above a minimum accepted value) are required for this calculation.  Medical History: Past Medical History  Diagnosis Date  . Hypertension   . GERD (gastroesophageal reflux disease)   . Hypercholesteremia   . History of anxiety     had anxiety attacks  . History of kidney stones   . Trigeminal neuralgia    Assessment: [age] yof admitted with c/o chest pain.  She is being worked up for cardiac issues and we have been asked to start her on IV heparin.  Her baseline labs are WNL with a H/H of 14.3/42.2 and a platelet count of 320K.  She has no noted history for bleeding issues but has had prior brain surgery for trigeminal neuralgia.  Goal of Therapy:  Heparin level 0.3-0.7 units/ml Monitor platelets by anticoagulation protocol: Yes   Plan:  1.  Will begin  IV heparin with a bolus of 4,000 units and IV heparin rate of 1150 units/hr 2.  Will obtain a heparin level 6 hours after starting therapy and adjust dose accordingly. 3.  Monitor for bleeding complications  Nadara MustardNita Cherene Dobbins, PharmD., MS Clinical Pharmacist Pager:  [phone] Thank you for allowing pharmacy to be part of this patients care team. 11/08/2013,7:24 PM

## 2013-11-08 NOTE — H&P (Signed)
Patient Demographics  Amanda Lyons, is a 58 y.o. female  MRN: [id number]   DOB - 1956/05/28  Admit Date - 11/08/2013  Outpatient Primary MD for the patient is Vonita Moss, MD   With History of -  Past Medical History  Diagnosis Date  . Hypertension   . GERD (gastroesophageal reflux disease)   . Hypercholesteremia   . History of anxiety     had anxiety attacks  . History of kidney stones   . Trigeminal neuralgia       Past Surgical History  Procedure Laterality Date  . Abdominal hysterectomy    . Gamma knife  1999    for trigeminal neuralgia  . Brain surgery      for trigeminal neuralgia  . Back surgery      fixed slipped disk  . Lumbar laminectomy/decompression microdiscectomy Right 05/02/2013    Procedure: Right Lumbar Five Sacral One Microdiskectomy;  Surgeon: Temple Pacini, MD;  Location: [facility];  Service: Neurosurgery;  Laterality: Right;  LUMBAR LAMINECTOMY/DECOMPRESSION MICRODISCECTOMY 1 LEVEL    in for   Chief Complaint  Patient presents with  . Chest Pain     HPI  Amanda Lyons  is a 58 y.o. female, hypertension, dyslipidemia, GERD, chronic back pain status post spine surgery, who has been noticing high blood pressures at home for the last few days, also is noticing some exertional shortness of breath for the last few days at work was today driving and noticed substernal chest heaviness along with some palpitations and shortness of breath, this lasted for about 2 hours, chest heaviness was nonradiating, aggravated by exertion relieved by rest, she called EMS who arrived and gave her nitroglycerin with chest pain relief, then came to the ER where she was noticed to have high blood pressure, unremarkable EKG, chest x-ray and blood work and I was called to admit the patient.   She is  currently chest pain-free. No previous history of CAD, she had a stress test many years ago which was unremarkable.    Review of Systems  currently negative review of systems  In addition to the HPI above,   No Fever-chills, No Headache, No changes with Vision or hearing, No problems swallowing food or Liquids, No Chest pain, Cough or Shortness of Breath, No Abdominal pain, No Nausea or Vommitting, Bowel movements are regular, No Blood in stool or Urine, No dysuria, No new skin rashes or bruises, No new joints pains-aches,  No new weakness, tingling, numbness in any extremity, No recent weight gain or loss, No polyuria, polydypsia or polyphagia, No significant Mental Stressors.  A full 10 point Review of Systems was done, except as stated above, all other Review of Systems were negative.   Social History History  Substance Use Topics  . Smoking status: Never Smoker   . Smokeless tobacco: Not on file  . Alcohol Use: No      Family History  CVA in patient's father  Prior to Admission medications   Medication Sig Start Date End Date Taking? Authorizing Provider  omeprazole (PRILOSEC) 20 MG capsule Take 20 mg by mouth daily.   Yes Historical Provider, MD  simvastatin (ZOCOR) 20 MG tablet Take 20 mg by mouth at bedtime.   Yes Historical Provider, MD    Allergies  Allergen Reactions  . Prednisone Nausea And Vomiting  . Sulfa Antibiotics Nausea And Vomiting    Physical Exam  Vitals  Blood pressure 152/83, pulse 73, temperature 98.5 F (36.9 C), temperature source Oral, resp. rate 21, SpO2 100.00%.   1. General middle-aged white female lying in bed in NAD,     2. Normal affect and insight, Not Suicidal or Homicidal, Awake Alert, Oriented X 3.  3. No F.N deficits, ALL C.Nerves Intact, Strength 5/5 all 4 extremities, Sensation intact all 4 extremities, Plantars down going.  4. Ears and Eyes appear Normal, Conjunctivae clear, PERRLA. Moist Oral Mucosa.  5. Supple  Neck, No JVD, No cervical lymphadenopathy appriciated, No Carotid Bruits.  6. Symmetrical Chest wall movement, Good air movement bilaterally, CTAB.  7. RRR, No Gallops, Rubs or Murmurs, No Parasternal Heave.  8. Positive Bowel Sounds, Abdomen Soft, Non tender, No organomegaly appriciated,No rebound -guarding or rigidity.  9.  No Cyanosis, Normal Skin Turgor, No Skin Rash or Bruise.  10. Good muscle tone,  joints appear normal , no effusions, Normal ROM.  11. No Palpable Lymph Nodes in Neck or Axillae     Data Review  CBC  Recent Labs Lab 11/08/13 1638  WBC 8.8  HGB 14.3  HCT 42.2  PLT 320  MCV 88.5  MCH 30.0  MCHC 33.9  RDW 13.1   ------------------------------------------------------------------------------------------------------------------  Chemistries   Recent Labs Lab 11/08/13 1638  NA 141  K 3.8  CL 100  CO2 26  GLUCOSE 114*  BUN 19  CREATININE 0.63  CALCIUM 10.0   ------------------------------------------------------------------------------------------------------------------ CrCl is unknown because both a height and weight (above a minimum accepted value) are required for this calculation. ------------------------------------------------------------------------------------------------------------------ No results found for this basename: TSH, T4TOTAL, FREET3, T3FREE, THYROIDAB,  in the last 72 hours   Coagulation profile No results found for this basename: INR, PROTIME,  in the last 168 hours ------------------------------------------------------------------------------------------------------------------- No results found for this basename: DDIMER,  in the last 72 hours -------------------------------------------------------------------------------------------------------------------  Cardiac Enzymes No results found for this basename: CK, CKMB, TROPONINI, MYOGLOBIN,  in the last 168  hours ------------------------------------------------------------------------------------------------------------------ No components found with this basename: POCBNP,    ---------------------------------------------------------------------------------------------------------------  Urinalysis No results found for this basename: colorurine, appearanceur, labspec, phurine, glucoseu, hgbur, bilirubinur, ketonesur, proteinur, urobilinogen, nitrite, leukocytesur    ----------------------------------------------------------------------------------------------------------------  Imaging results:   Dg Chest 2 View  11/08/2013   CLINICAL DATA:  Chest pain since 1 p.m. today.  EXAM: CHEST  2 VIEW  COMPARISON:  04/25/2013  FINDINGS: Heart size and pulmonary vascularity are normal and the lungs are clear. No effusions. No osseous abnormality.  IMPRESSION: Normal chest.   Electronically Signed   By: Geanie Cooley M.D.   On: 11/08/2013 17:22    My personal review of EKG: Rhythm NSR,  no Acute ST changes    Assessment & Plan    1. Chest pain - heart score between 4 and 5, will be kept for 23 hour observation on a telemetry bed, nitro paste continue, cycle troponins, n.p.o. after midnight, place her on aspirin, beta blocker for better blood pressure control along with home dose ACE inhibitor. Call cardiology in the morning  to evaluate for possible stress test.    2. Hypertension. In poor control, ACE inhibitor at home dose continued added Coreg, as needed IV hydralazine ordered.    3. Dyslipidemia. Continue home dose statin.    4. GERD continue PPI.    DVT Prophylaxis Heparin    AM Labs Ordered, also please review Full Orders  Family Communication: Admission, patients condition and plan of care including tests being ordered have been discussed with the patient and husband who indicate understanding and agree with the plan and Code Status.  Code Status full  Likely DC to   home  Condition Fair  Time spent in minutes : 35    Branda Chaudhary K M.D on 11/08/2013 at 6:18 PM  Between 7am to 7pm - Pager - [phone]  After 7pm go to [email or web address] - password [email or web address]  And look for the night coverage person covering me after hours  [facility] Office  [phone]

## 2013-11-08 NOTE — ED Notes (Signed)
Per EMS pt from home, was driving to work and had some substernal chest "heaviness" and "fluttering and had difficulty breathing. Pt. Refused nitro and ASA. Pt hypertensive and endorses headache. Denies blurry vision or weakness particular to one side. Pt SPO2sats 95% on RA place on 3L [place] for comfort- pt sts decreased chest heaviness. Pt heaviness from 8/10 initially. Currently 2/10. Pt in NAD. Able to ambulate without difficulty.

## 2013-11-08 NOTE — ED Provider Notes (Signed)
CSN: [id number]     Arrival date & time 11/08/13  1616 History   First MD Initiated Contact with Patient 11/08/13 1623     Chief Complaint  Patient presents with  . Chest Pain     (Consider location/radiation/quality/duration/timing/severity/associated sxs/prior Treatment) Patient is a 58 y.o. female presenting with chest pain. The history is provided by the patient.  Chest Pain Pain location:  Substernal area Pain quality: crushing and pressure   Pain radiates to:  Does not radiate Pain radiates to the back: no   Pain severity:  Severe Onset quality:  Sudden Timing:  Constant Progression:  Improving Chronicity:  New Context: at rest   Relieved by:  Nothing Worsened by:  Nothing tried Associated symptoms: headache and shortness of breath   Associated symptoms: no abdominal pain, no cough, no fever and not vomiting     Past Medical History  Diagnosis Date  . Hypertension   . GERD (gastroesophageal reflux disease)   . Hypercholesteremia   . History of anxiety     had anxiety attacks  . History of kidney stones   . Trigeminal neuralgia    Past Surgical History  Procedure Laterality Date  . Abdominal hysterectomy    . Gamma knife  1999    for trigeminal neuralgia  . Brain surgery      for trigeminal neuralgia  . Back surgery      fixed slipped disk  . Lumbar laminectomy/decompression microdiscectomy Right 05/02/2013    Procedure: Right Lumbar Five Sacral One Microdiskectomy;  Surgeon: Temple Pacini, MD;  Location: [facility];  Service: Neurosurgery;  Laterality: Right;  LUMBAR LAMINECTOMY/DECOMPRESSION MICRODISCECTOMY 1 LEVEL   No family history on file. History  Substance Use Topics  . Smoking status: Never Smoker   . Smokeless tobacco: Not on file  . Alcohol Use: No   OB History   Grav Para Term Preterm Abortions TAB SAB Ect Mult Living                 Review of Systems  Constitutional: Negative for fever.  Respiratory: Positive for shortness of breath.  Negative for cough.   Cardiovascular: Positive for chest pain.  Gastrointestinal: Negative for vomiting and abdominal pain.  Neurological: Positive for headaches.  All other systems reviewed and are negative.      Allergies  Prednisone and Sulfa antibiotics  Home Medications   Current Outpatient Rx  Name  Route  Sig  Dispense  Refill  . benazepril-hydrochlorthiazide (LOTENSIN HCT) 20-12.5 MG per tablet   Oral   Take 1 tablet by mouth daily.         . cyclobenzaprine (FLEXERIL) 10 MG tablet   Oral   Take 1 tablet (10 mg total) by mouth 3 (three) times daily as needed for muscle spasms.   30 tablet   0   . omeprazole (PRILOSEC) 20 MG capsule   Oral   Take 20 mg by mouth daily.         Marland Kitchen oxyCODONE-acetaminophen (PERCOCET/ROXICET) 5-325 MG per tablet   Oral   Take 0.5-1 tablets by mouth every 4 (four) hours as needed for pain.   60 tablet   0   . promethazine (PHENERGAN) 12.5 MG tablet   Oral   Take 1 tablet (12.5 mg total) by mouth every 6 (six) hours as needed for nausea.   15 tablet   0   . simvastatin (ZOCOR) 20 MG tablet   Oral   Take 20 mg by mouth  at bedtime.          BP 174/85  Pulse 79  Temp(Src) 98.5 F (36.9 C) (Oral)  Resp 20  SpO2 98% Physical Exam  Nursing note and vitals reviewed. Constitutional: She is oriented to person, place, and time. She appears well-developed and well-nourished. No distress.  HENT:  Head: Normocephalic and atraumatic.  Eyes: EOM are normal. Pupils are equal, round, and reactive to light.  Neck: Normal range of motion. Neck supple.  Cardiovascular: Normal rate and regular rhythm.  Exam reveals no friction rub.   No murmur heard. Pulmonary/Chest: Effort normal and breath sounds normal. No respiratory distress. She has no wheezes. She has no rales.  Abdominal: Soft. She exhibits no distension. There is no tenderness. There is no rebound.  Musculoskeletal: Normal range of motion. She exhibits no edema.   Neurological: She is alert and oriented to person, place, and time. No cranial nerve deficit. She exhibits normal muscle tone. Coordination normal.  Skin: No rash noted. She is not diaphoretic.    ED Course  Procedures (including critical care time) Labs Review Labs Reviewed  CBC  BASIC METABOLIC PANEL  Rosezena SensorI-STAT TROPOININ, ED   Imaging Review Dg Chest 2 View  11/08/2013   CLINICAL DATA:  Chest pain since 1 p.m. today.  EXAM: CHEST  2 VIEW  COMPARISON:  04/25/2013  FINDINGS: Heart size and pulmonary vascularity are normal and the lungs are clear. No effusions. No osseous abnormality.  IMPRESSION: Normal chest.   Electronically Signed   By: Geanie CooleyJim  Maxwell M.D.   On: 11/08/2013 17:22     EKG Interpretation   Date/Time:  Tuesday November 08 2013 16:26:12 EDT Ventricular Rate:  82 PR Interval:  155 QRS Duration: 83 QT Interval:  484 QTC Calculation: 565 R Axis:   33 Text Interpretation:  Sinus rhythm Borderline T abnormalities, anterior  leads Minimal ST elevation, inferior leads Prolonged QT interval Artifact  in lead(s) I II III aVR aVL aVF V6 Similar to prior Confirmed by Gwendolyn GrantWALDEN   MD, Rylan Bernard ([id number]) on 11/08/2013 4:47:03 PM      MDM   Final diagnoses:  Chest pain  Dyslipidemia  HNP (herniated nucleus pulposus), lumbar  Hypertension  GERD (gastroesophageal reflux disease)    5F presents with CP. Concerning for ACS. Began at rest - central heaviness, no radiation. 8/10 initially, improved to 2/10 now. Associated SOB. No fever, cough, N/V, diaphoresis. Feels different than prior GERD or anxiety. Risk factors - HTN, HLD. Here with stable vitals, exam benign, EKG benign. Will check labs. Having CP at this time, will give NTG and ASA. CP resolved with NTG. Patient admitted to medicine for further workup.  Dagmar HaitWilliam Sharniece Gibbon, MD 11/08/13 [phone]

## 2013-11-09 DIAGNOSIS — K219 Gastro-esophageal reflux disease without esophagitis: Secondary | ICD-10-CM

## 2013-11-09 DIAGNOSIS — I10 Essential (primary) hypertension: Secondary | ICD-10-CM

## 2013-11-09 DIAGNOSIS — R079 Chest pain, unspecified: Secondary | ICD-10-CM

## 2013-11-09 LAB — CBC
HEMATOCRIT: 39.2 % (ref 36.0–46.0)
HEMOGLOBIN: 13 g/dL (ref 12.0–15.0)
MCH: 29.3 pg (ref 26.0–34.0)
MCHC: 33.2 g/dL (ref 30.0–36.0)
MCV: 88.5 fL (ref 78.0–100.0)
Platelets: 295 10*3/uL (ref 150–400)
RBC: 4.43 MIL/uL (ref 3.87–5.11)
RDW: 13.2 % (ref 11.5–15.5)
WBC: 7.8 10*3/uL (ref 4.0–10.5)

## 2013-11-09 LAB — HEPARIN LEVEL (UNFRACTIONATED)
Heparin Unfractionated: 0.32 IU/mL (ref 0.30–0.70)
Heparin Unfractionated: 0.42 IU/mL (ref 0.30–0.70)

## 2013-11-09 LAB — TROPONIN I
Troponin I: <0.3 ng/mL (ref <0.30)
Troponin I: <0.3 ng/mL (ref <0.30)

## 2013-11-09 MED ORDER — ACETAMINOPHEN 325 MG PO TABS
650.0000 mg | ORAL_TABLET | Freq: Four times a day (QID) | ORAL | Status: DC | PRN
  Administered 2013-11-09: 650 mg via ORAL
  Filled 2013-11-09: qty 2

## 2013-11-09 MED ORDER — ASPIRIN 81 MG PO TBEC: 81.0000 mg | DELAYED_RELEASE_TABLET | Freq: Every day | ORAL | Status: AC

## 2013-11-09 MED ORDER — NITROGLYCERIN 0.4 MG SL SUBL: 0.4000 mg | SUBLINGUAL_TABLET | SUBLINGUAL | Status: DC | PRN

## 2013-11-09 MED ORDER — CARVEDILOL 6.25 MG PO TABS: 6.2500 mg | ORAL_TABLET | Freq: Two times a day (BID) | ORAL | Status: DC

## 2013-11-09 NOTE — Progress Notes (Signed)
Amanda BellingRebecca Lares was admitted to the Hospital on 11/08/2013 and Discharged  11/09/2013 and should be excused from work/school   for 3 days starting 11/08/2013 , may return to work/school without any restrictions.  Call Susa RaringPrashant Singh MD, [facility] [phone] with questions.  Leroy SeaSINGH,PRASHANT K M.D on 11/09/2013,at 4:11 PM  [facility] Office  [phone]

## 2013-11-09 NOTE — Discharge Instructions (Signed)
Follow with Primary MD Vonita MossRISSMAN,MARK, MD in 7 days   Get CBC, CMP, checked 7 days by Primary MD and again as instructed by your Primary MD.    Activity: As tolerated with Full fall precautions use walker/cane & assistance as needed   Disposition Home     Diet: Heart Healthy    For Heart failure patients - Check your Weight same time everyday, if you gain over 2 pounds, or you develop in leg swelling, experience more shortness of breath or chest pain, call your Primary MD immediately. Follow Cardiac Low Salt Diet and 1.8 lit/day fluid restriction.   On your next visit with her primary care physician please Get Medicines reviewed and adjusted.  Please request your Prim.MD to go over all Hospital Tests and Procedure/Radiological results at the follow up, please get all Hospital records sent to your Prim MD by signing hospital release before you go home.   If you experience worsening of your admission symptoms, develop shortness of breath, life threatening emergency, suicidal or homicidal thoughts you must seek medical attention immediately by calling 911 or calling your MD immediately  if symptoms less severe.  You Must read complete instructions/literature along with all the possible adverse reactions/side effects for all the Medicines you take and that have been prescribed to you. Take any new Medicines after you have completely understood and accpet all the possible adverse reactions/side effects.   Do not drive and provide baby sitting services if your were admitted for syncope or siezures until you have seen by Primary MD or a Neurologist and advised to do so again.  Do not drive when taking Pain medications.    Do not take more than prescribed Pain, Sleep and Anxiety Medications  Special Instructions: If you have smoked or chewed Tobacco  in the last 2 yrs please stop smoking, stop any regular Alcohol  and or any Recreational drug use.  Wear Seat belts while driving.   Please  note  You were cared for by a hospitalist during your hospital stay. If you have any questions about your discharge medications or the care you received while you were in the hospital after you are discharged, you can call the unit and asked to speak with the hospitalist on call if the hospitalist that took care of you is not available. Once you are discharged, your primary care physician will handle any further medical issues. Please note that NO REFILLS for any discharge medications will be authorized once you are discharged, as it is imperative that you return to your primary care physician (or establish a relationship with a primary care physician if you do not have one) for your aftercare needs so that they can reassess your need for medications and monitor your lab values.

## 2013-11-09 NOTE — Discharge Summary (Signed)
Amanda Lyons, is a 58 y.o. female  DOB 1956-04-19  MRN [id number].  Admission date:  11/08/2013  Admitting Physician  Leroy Sea, MD  Discharge Date:  11/09/2013   Primary MD  Vonita Moss, MD  Recommendations for primary care physician for things to follow:   Monitor blood pressure adjust medications as needed   Admission Diagnosis  GERD (gastroesophageal reflux disease) [530.81] Hypertension [401.9] Dyslipidemia [272.4] HNP (herniated nucleus pulposus), lumbar [722.10] Chest pain [786.50]   Discharge Diagnosis  GERD (gastroesophageal reflux disease) [530.81] Hypertension [401.9] Dyslipidemia [272.4] HNP (herniated nucleus pulposus), lumbar [722.10] Chest pain [786.50]  atypical  Principal Problem:   Chest pain Active Problems:   HNP (herniated nucleus pulposus), lumbar   Dyslipidemia      Past Medical History  Diagnosis Date  . Hypertension   . GERD (gastroesophageal reflux disease)   . Hypercholesteremia   . History of anxiety     had anxiety attacks  . History of kidney stones   . Trigeminal neuralgia     Past Surgical History  Procedure Laterality Date  . Abdominal hysterectomy    . Gamma knife  1999    for trigeminal neuralgia  . Brain surgery      for trigeminal neuralgia  . Back surgery      fixed slipped disk  . Lumbar laminectomy/decompression microdiscectomy Right 05/02/2013    Procedure: Right Lumbar Five Sacral One Microdiskectomy;  Surgeon: Temple Pacini, MD;  Location: [facility];  Service: Neurosurgery;  Laterality: Right;  LUMBAR LAMINECTOMY/DECOMPRESSION MICRODISCECTOMY 1 LEVEL     Discharge Condition: Stable   Follow UP  Follow-up Information   Follow up with CRISSMAN,MARK, MD. Schedule an appointment as soon as possible for a visit in 1 week.   Specialty:  Family  Medicine   Contact information:   [address] [place] [address] [phone]       Follow up with Lars Masson, MD. Schedule an appointment as soon as possible for a visit in 1 week.   Specialty:  Cardiology   Contact information:   [address] [place] [address] [phone]         Discharge Instructions  and  Discharge Medications     Discharge Orders   Future Orders Complete By Expires   Diet - low sodium heart healthy  As directed    Discharge instructions  As directed    Comments:     Follow with Primary MD Vonita Moss, MD in 7 days   Get CBC, CMP, checked 7 days by Primary MD and again as instructed by your Primary MD.    Activity: As tolerated with Full fall precautions use walker/cane & assistance as needed   Disposition Home     Diet: Heart Healthy    For Heart failure patients - Check your Weight same time everyday, if you gain over 2 pounds, or you develop in leg swelling, experience more shortness of breath or chest pain, call your Primary MD immediately.  Follow Cardiac Low Salt Diet and 1.8 lit/day fluid restriction.   On your next visit with her primary care physician please Get Medicines reviewed and adjusted.  Please request your Prim.MD to go over all Hospital Tests and Procedure/Radiological results at the follow up, please get all Hospital records sent to your Prim MD by signing hospital release before you go home.   If you experience worsening of your admission symptoms, develop shortness of breath, life threatening emergency, suicidal or homicidal thoughts you must seek medical attention immediately by calling 911 or calling your MD immediately  if symptoms less severe.  You Must read complete instructions/literature along with all the possible adverse reactions/side effects for all the Medicines you take and that have been prescribed to you. Take any new Medicines after you have completely understood and accpet  all the possible adverse reactions/side effects.   Do not drive and provide baby sitting services if your were admitted for syncope or siezures until you have seen by Primary MD or a Neurologist and advised to do so again.  Do not drive when taking Pain medications.    Do not take more than prescribed Pain, Sleep and Anxiety Medications  Special Instructions: If you have smoked or chewed Tobacco  in the last 2 yrs please stop smoking, stop any regular Alcohol  and or any Recreational drug use.  Wear Seat belts while driving.   Please note  You were cared for by a hospitalist during your hospital stay. If you have any questions about your discharge medications or the care you received while you were in the hospital after you are discharged, you can call the unit and asked to speak with the hospitalist on call if the hospitalist that took care of you is not available. Once you are discharged, your primary care physician will handle any further medical issues. Please note that NO REFILLS for any discharge medications will be authorized once you are discharged, as it is imperative that you return to your primary care physician (or establish a relationship with a primary care physician if you do not have one) for your aftercare needs so that they can reassess your need for medications and monitor your lab values.   Increase activity slowly  As directed        Medication List         aspirin 81 MG EC tablet  Take 1 tablet (81 mg total) by mouth daily.     benazepril-hydrochlorthiazide 20-12.5 MG per tablet  Commonly known as:  LOTENSIN HCT  Take 1 tablet by mouth daily.     carvedilol 6.25 MG tablet  Commonly known as:  COREG  Take 1 tablet (6.25 mg total) by mouth 2 (two) times daily with a meal.     nitroGLYCERIN 0.4 MG SL tablet  Commonly known as:  NITROSTAT  Place 1 tablet (0.4 mg total) under the tongue every 5 (five) minutes as needed for chest pain (CP or SOB).     omeprazole  20 MG capsule  Commonly known as:  PRILOSEC  Take 20 mg by mouth daily.     oxyCODONE-acetaminophen 5-325 MG per tablet  Commonly known as:  PERCOCET/ROXICET  Take 0.5 tablets by mouth daily as needed for moderate pain or severe pain (back pain from recent surgery).     simvastatin 20 MG tablet  Commonly known as:  ZOCOR  Take 20 mg by mouth at bedtime.          Diet and  Activity recommendation: See Discharge Instructions above   Consults obtained - Cards   Major procedures and Radiology Reports - PLEASE review detailed and final reports for all details, in brief -   Stress Echo negative and non acute per Dr Delton See, patient stable for DC.   Dg Chest 2 View  11/08/2013   CLINICAL DATA:  Chest pain since 1 p.m. today.  EXAM: CHEST  2 VIEW  COMPARISON:  04/25/2013  FINDINGS: Heart size and pulmonary vascularity are normal and the lungs are clear. No effusions. No osseous abnormality.  IMPRESSION: Normal chest.   Electronically Signed   By: Geanie Cooley M.D.   On: 11/08/2013 17:22    Micro Results      No results found for this or any previous visit (from the past 240 hour(s)).   History of present illness and  Hospital Course:     Kindly see H&P for history of present illness and admission details, please review complete Labs, Consult reports and Test reports for all details in brief Amanda Lyons, is a 58 y.o. female, patient with history of  hypertension, dyslipidemia, GERD, chronic back pain status post spine surgery, who has been noticing high blood pressures at home for the last few days, also is noticing some exertional shortness of breath for the last few days at work was today driving and noticed substernal chest heaviness along with some palpitations and shortness of breath, this lasted for about 2 hours, chest heaviness was nonradiating, aggravated by exertion relieved by rest, she called EMS who arrived and gave her nitroglycerin with chest pain relief, then came to  the ER where she was noticed to have high blood pressure, unremarkable EKG, chest x-ray and blood work and I was called to admit the patient.   1.Chest pain - ruled out for MI, EKG nonacute, currently chest pain-free, blood pressure medications adjusted, blood pressure stable, seen by cardiology and underwent stress echo gram negative and non acute per Dr Delton See, patient stable for DC.    2. Hypertension in poor control. Added Coreg to her home medication with good effect, follow with PCP get medications adjusted as needed. Coreg prescribed upon discharge.    3. GERD on PPI continue.    4. Dyslipidemia continue home dose statin.       Today   Subjective:   Amanda Lyons today has no headache,no chest abdominal pain,no new weakness tingling or numbness, feels much better wants to go home today.    Objective:   Blood pressure 118/78, pulse 65, temperature 97.5 F (36.4 C), temperature source Oral, resp. rate 18, height 5\' 2"  (1.575 m), weight 74.707 kg (164 lb 11.2 oz), SpO2 93.00%.  No intake or output data in the 24 hours ending 11/09/13 1229  Exam Awake Alert, Oriented *3, No new F.N deficits, Normal affect [place].AT,PERRAL Supple Neck,No JVD, No cervical lymphadenopathy appriciated.  Symmetrical Chest wall movement, Good air movement bilaterally, CTAB RRR,No Gallops,Rubs or new Murmurs, No Parasternal Heave +ve B.Sounds, Abd Soft, Non tender, No organomegaly appriciated, No rebound -guarding or rigidity. No Cyanosis, Clubbing or edema, No new Rash or bruise  Data Review   CBC w Diff:  Lab Results  Component Value Date   WBC 7.8 11/09/2013   HGB 13.0 11/09/2013   HCT 39.2 11/09/2013   PLT 295 11/09/2013   LYMPHOPCT 21 04/25/2013   MONOPCT 8 04/25/2013   EOSPCT 1 04/25/2013   BASOPCT 0 04/25/2013    CMP:  Lab Results  Component  Value Date   NA 141 11/08/2013   K 3.8 11/08/2013   CL 100 11/08/2013   CO2 26 11/08/2013   BUN 19 11/08/2013   CREATININE 0.63 11/08/2013   .   Total Time in preparing paper work, data evaluation and todays exam - 35 minutes  Leroy Sea M.D on 11/09/2013 at 12:29 PM  [facility] Office  [phone]

## 2013-11-09 NOTE — Progress Notes (Signed)
ANTICOAGULATION CONSULT NOTE - Follow Up Consult  Pharmacy Consult for Heparin Indication: chest pain/ACS  Allergies  Allergen Reactions  . Prednisone Nausea And Vomiting  . Sulfa Antibiotics Nausea And Vomiting    Patient Measurements: Height: 5\' 2"  (157.5 cm) Weight: 164 lb 11.2 oz (74.707 kg) IBW/kg (Calculated) : 50.1 Heparin Dosing Weight: 66kg  Vital Signs: Temp: 97.9 F (36.6 C) (04/01 0800) Temp src: Oral (04/01 0800) BP: 116/71 mmHg (04/01 0800) Pulse Rate: 74 (04/01 0800)  Labs:  Recent Labs  11/08/13 1638 11/08/13 1855 11/09/13 0123 11/09/13 0658 11/09/13 0855  HGB 14.3  --  13.0  --   --   HCT 42.2  --  39.2  --   --   PLT 320  --  295  --   --   HEPARINUNFRC  --   --  0.32  --  0.42  CREATININE 0.63  --   --   --   --   TROPONINI  --  <0.30 <0.30 <0.30  --     Estimated Creatinine Clearance: 74.3 ml/min (by C-G formula based on Cr of 0.63).   Medications:  Heparin @ 1150 units/hr  Assessment: 58yof continues on heparin for chest pain. Troponins negative x 3. Heparin level is therapeutic. CBC stable. No bleeding reported.  Goal of Therapy:  Heparin level 0.3-0.7 units/ml Monitor platelets by anticoagulation protocol: Yes   Plan:  1) Continue heparin at 1150 units/hr 2) Follow up heparin level, CBC in AM  Fredrik RiggerMarkle, Dellanira Dillow Sue 11/09/2013,10:05 AM

## 2013-11-09 NOTE — Progress Notes (Signed)
ANTICOAGULATION CONSULT NOTE - Follow Up Consult  Pharmacy Consult for Heparin  Indication: chest pain/ACS  Allergies  Allergen Reactions  . Prednisone Nausea And Vomiting  . Sulfa Antibiotics Nausea And Vomiting    Patient Measurements: Height: 5\' 2"  (157.5 cm) IBW/kg (Calculated) : 50.1 Heparin Dosing Weight: ~70 kg  Vital Signs: Temp: 98 F (36.7 C) (03/31 2352) Temp src: Oral (03/31 2352) BP: 125/65 mmHg (03/31 2352) Pulse Rate: 67 (03/31 2352)  Labs:  Recent Labs  11/08/13 1638 11/08/13 1855 11/09/13 0123  HGB 14.3  --  13.0  HCT 42.2  --  39.2  PLT 320  --  295  HEPARINUNFRC  --   --  0.32  CREATININE 0.63  --   --   TROPONINI  --  <0.30 <0.30    Medications:  Heparin 1150 units/hr  Assessment: 58 y/o F on heparin for CP. HL is 0.32. Other labs as above.   Goal of Therapy:  Heparin level 0.3-0.7 units/ml Monitor platelets by anticoagulation protocol: Yes   Plan:  -Continue heparin at 1150 units/hr -0900 HL -Daily CBC/HL -Monitor for bleeding  Amanda Lyons, Amanda Lyons 11/09/2013,2:50 AM

## 2013-11-09 NOTE — Progress Notes (Signed)
  Echocardiogram Echocardiogram Stress Test has been performed.  Georgian CoWILLIAMS, Justyna Timoney 11/09/2013, 2:33 PM

## 2013-11-09 NOTE — Progress Notes (Signed)
Pt provided with dc instructions and education. Pt verbalized understanding. Pt has no questions at this time. IV removed with tip intact. Hear tmonitor cleaned and returned to front. Clay Solum, RN 

## 2013-11-09 NOTE — Progress Notes (Addendum)
CARDIOLOGY CONSULT NOTE   Patient ID: Amanda Lyons MRN: [id number], DOB/AGE: Dec 25, 1955   Admit date: 11/08/2013 Date of Consult: 11/09/2013  Primary Physician: Vonita Moss, MD Primary Cardiologist: None  Pt. Profile  Chest pain  Problem List  Past Medical History  Diagnosis Date  . Hypertension   . GERD (gastroesophageal reflux disease)   . Hypercholesteremia   . History of anxiety     had anxiety attacks  . History of kidney stones   . Trigeminal neuralgia     Past Surgical History  Procedure Laterality Date  . Abdominal hysterectomy    . Gamma knife  1999    for trigeminal neuralgia  . Brain surgery      for trigeminal neuralgia  . Back surgery      fixed slipped disk  . Lumbar laminectomy/decompression microdiscectomy Right 05/02/2013    Procedure: Right Lumbar Five Sacral One Microdiskectomy;  Surgeon: Temple Pacini, MD;  Location: [facility];  Service: Neurosurgery;  Laterality: Right;  LUMBAR LAMINECTOMY/DECOMPRESSION MICRODISCECTOMY 1 LEVEL    Allergies  Allergies  Allergen Reactions  . Prednisone Nausea And Vomiting  . Sulfa Antibiotics Nausea And Vomiting   HPI   Amanda Lyons is a 58 y.o. female, hypertension, dyslipidemia, GERD, chronic back pain status post spine surgery, who has been noticing high blood pressures at home for the last few days, also is noticing some exertional shortness of breath for the last few days at work was today driving and noticed substernal chest heaviness along with some palpitations and shortness of breath, this lasted for about 2 hours, chest heaviness was nonradiating, aggravated by exertion relieved by rest, she called EMS who arrived and gave her nitroglycerin with chest pain relief, then came to the ER where she was noticed to have high blood pressure, unremarkable EKG, chest x-ray and blood work and I was called to admit the patient.  The patient had similar presentation approximately 10 years ago when she was diagnosed  with hypertension and underwent a stress test at [facility]. Stress test was negative and her symptoms resolved after her blood pressure was controlled.  Yesterday after EMS was called her blood pressure was 201/110 and she gets some relief with sublingual nitroglycerin that brought her blood pressure to 190.  Inpatient Medications  . aspirin EC  81 mg Oral Daily  . carvedilol  6.25 mg Oral BID WC  . lisinopril  10 mg Oral Daily  . nitroGLYCERIN  0.5 inch Topical 3 times per day  . pantoprazole  40 mg Oral Daily  . simvastatin  20 mg Oral QHS   Family History No family history on file.   Social History History   Social History  . Marital Status: Married    Spouse Name: N/A    Number of Children: N/A  . Years of Education: N/A   Occupational History  . Not on file.   Social History Main Topics  . Smoking status: Never Smoker   . Smokeless tobacco: Not on file  . Alcohol Use: No  . Drug Use: No  . Sexual Activity: Not on file   Other Topics Concern  . Not on file   Social History Narrative  . No narrative on file     Review of Systems  General:  No chills, fever, night sweats or weight changes.  Cardiovascular:  No chest pain, dyspnea on exertion, edema, orthopnea, palpitations, paroxysmal nocturnal dyspnea. Dermatological: No rash, lesions/masses Respiratory: No cough, dyspnea Urologic: No hematuria, dysuria  Abdominal:   No nausea, vomiting, diarrhea, bright red blood per rectum, melena, or hematemesis Neurologic:  No visual changes, wkns, changes in mental status. All other systems reviewed and are otherwise negative except as noted above.  Physical Exam  Blood pressure 116/71, pulse 74, temperature 97.9 F (36.6 C), temperature source Oral, resp. rate 18, height 5\' 2"  (1.575 m), weight 164 lb 11.2 oz (74.707 kg), SpO2 92.00%.  General: Pleasant, NAD Psych: Normal affect. Neuro: Alert and oriented X 3. Moves all extremities spontaneously. HEENT: Normal  Neck:  Supple without bruits or JVD. Lungs:  Resp regular and unlabored, CTA. Heart: RRR no s3, s4, or murmurs. Abdomen: Soft, non-tender, non-distended, BS + x 4.  Extremities: No clubbing, cyanosis or edema. DP/PT/Radials 2+ and equal bilaterally.  Labs  Recent Labs  11/08/13 1855 11/09/13 0123 11/09/13 0658  TROPONINI <0.30 <0.30 <0.30   Lab Results  Component Value Date   WBC 7.8 11/09/2013   HGB 13.0 11/09/2013   HCT 39.2 11/09/2013   MCV 88.5 11/09/2013   PLT 295 11/09/2013    Recent Labs Lab 11/08/13 1638  NA 141  K 3.8  CL 100  CO2 26  BUN 19  CREATININE 0.63  CALCIUM 10.0  GLUCOSE 114*   Radiology/Studies  Dg Chest 2 View  11/08/2013   CLINICAL DATA:   IMPRESSION: Normal chest.   Electronically Signed     Echocardiogram - NONE  ECG: Sinus rhythm, nonspecific T wave abnormalities, unchanged from EKG on 04/25/2013  Telemetry:     ASSESSMENT AND PLAN  58 year old female with prior medical history of hypertensive urgency, hyperlipidemia  1. exertional chest pain - possibly related to hypertensive urgency, blood pressure on presentation 200/110 and relieved with decrease of her blood pressure. We'll schedule an exercise treadmill stress echocardiogram to rule out ischemia and also give us information about left ventricular ejection fraction, degree of LVH, and possible wall motion abnormalities. Troponins are negative x3, her EKG is unchanged from prior in September of 2014. We will discontinue heparin drip.  2. Hypertension - currently under control with ACE inhibitor and beta blocker. We'll assess response to stress during the exercise treadmill stress echocardiogram.  3. Hyperlipidemia - followed by primary care physician currently on simvastatin 20 mg daily.   Signed, Lars MassonNELSON, [name], MD, [facility] 11/09/2013, 10:26 AM

## 2013-11-09 NOTE — Progress Notes (Signed)
UR completed 

## 2013-11-10 ENCOUNTER — Encounter: Payer: Self-pay | Admitting: *Deleted

## 2013-11-10 ENCOUNTER — Telehealth: Payer: Self-pay | Admitting: Cardiology

## 2013-11-10 NOTE — Telephone Encounter (Signed)
Appointment scheduled for 4/6, pt aware.

## 2013-11-10 NOTE — Telephone Encounter (Signed)
New Message:  Pt is requesting to be worked in next week per her discharge instructions   Follow up with Lars MassonNELSON, KATARINA H, MD. Schedule an appointment as soon as possible for a visit in 1 week.

## 2013-11-14 ENCOUNTER — Ambulatory Visit (INDEPENDENT_AMBULATORY_CARE_PROVIDER_SITE_OTHER): Payer: No Typology Code available for payment source | Admitting: Cardiology

## 2013-11-14 ENCOUNTER — Other Ambulatory Visit: Payer: Self-pay | Admitting: *Deleted

## 2013-11-14 ENCOUNTER — Encounter: Payer: Self-pay | Admitting: Cardiology

## 2013-11-14 VITALS — BP 152/90 | HR 64 | Ht 62.0 in | Wt 169.0 lb

## 2013-11-14 DIAGNOSIS — I10 Essential (primary) hypertension: Secondary | ICD-10-CM

## 2013-11-14 LAB — CBC
HCT: 40.7 % (ref 36.0–46.0)
Hemoglobin: 13.6 g/dL (ref 12.0–15.0)
MCHC: 33.4 g/dL (ref 30.0–36.0)
MCV: 88.4 fl (ref 78.0–100.0)
Platelets: 341 10*3/uL (ref 150.0–400.0)
RBC: 4.6 Mil/uL (ref 3.87–5.11)
RDW: 13.7 % (ref 11.5–14.6)
WBC: 6.6 10*3/uL (ref 4.5–10.5)

## 2013-11-14 LAB — COMPREHENSIVE METABOLIC PANEL
ALT: 26 U/L (ref 0–35)
AST: 18 U/L (ref 0–37)
Albumin: 3.9 g/dL (ref 3.5–5.2)
Alkaline Phosphatase: 117 U/L (ref 39–117)
BUN: 21 mg/dL (ref 6–23)
CO2: 29 mEq/L (ref 19–32)
Calcium: 10 mg/dL (ref 8.4–10.5)
Chloride: 102 mEq/L (ref 96–112)
Creatinine, Ser: 0.6 mg/dL (ref 0.4–1.2)
GFR: 103.85 mL/min (ref >60.00)
Glucose, Bld: 105 mg/dL — ABNORMAL HIGH (ref 70–99)
Potassium: 4.1 mEq/L (ref 3.5–5.1)
Sodium: 139 mEq/L (ref 135–145)
Total Bilirubin: 0.4 mg/dL (ref 0.3–1.2)
Total Protein: 7.3 g/dL (ref 6.0–8.3)

## 2013-11-14 MED ORDER — AMLODIPINE BESYLATE 5 MG PO TABS: 5.0000 mg | ORAL_TABLET | Freq: Every day | ORAL | Status: DC

## 2013-11-14 MED ORDER — BENAZEPRIL-HYDROCHLOROTHIAZIDE 20-12.5 MG PO TABS: 1.0000 | ORAL_TABLET | Freq: Every day | ORAL | Status: DC

## 2013-11-14 NOTE — Progress Notes (Signed)
Patient ID: Amanda Lyons, female   DOB: 1955-10-05, 58 y.o.   MRN: [id number]     [name] Date of Encounter: 11/14/2013  Primary Care Provider:  Vonita Moss, MD Primary Cardiologist:  Lars Masson  Problem List   Past Medical History  Diagnosis Date  . Hypertension   . GERD (gastroesophageal reflux disease)   . History of anxiety     had anxiety attacks  . History of kidney stones   . Trigeminal neuralgia   . HNP (herniated nucleus pulposus), lumbar 05/02/2013  . Dyslipidemia 11/08/2013  . Chest pain 11/08/2013   Past Surgical History  Procedure Laterality Date  . Abdominal hysterectomy    . Gamma knife  1999    for trigeminal neuralgia  . Brain surgery      for trigeminal neuralgia  . Lumbar laminectomy/decompression microdiscectomy  04/19/2003    Right L5-S1 intralaminar laminotomy for excision of herniated disk with the operating microscope  . Lumbar laminectomy/decompression microdiscectomy Right 05/02/2013    Procedure: Right Lumbar Five Sacral One Microdiskectomy;  Surgeon: Temple Pacini, MD;  Location: [facility];  Service: Neurosurgery;  Laterality: Right;  LUMBAR LAMINECTOMY/DECOMPRESSION MICRODISCECTOMY 1 LEVEL    Allergies  Allergies  Allergen Reactions  . Prednisone Nausea And Vomiting  . Sulfa Antibiotics Nausea And Vomiting    HPI  Amanda Lyons is a 58 y.o. female, hypertension, dyslipidemia, GERD, chronic back pain status post spine surgery, who has been noticing high blood pressures at home for the last few days, also is noticing some exertional shortness of breath for the last few days at work was today driving and noticed substernal chest heaviness along with some palpitations and shortness of breath, this lasted for about 2 hours, chest heaviness was nonradiating, aggravated by exertion relieved by rest, she called EMS who arrived and gave her nitroglycerin with chest pain relief, then came to the ER where she was noticed to  have high blood pressure, unremarkable EKG, chest x-ray and blood work and I was called to admit the patient.  The patient had similar presentation approximately 10 years ago when she was diagnosed with hypertension and underwent a stress test at [facility]. Stress test was negative and her symptoms resolved after her blood pressure was controlled.  After EMS was called her blood pressure was 201/110 and she gets some relief with sublingual nitroglycerin that brought her blood pressure to 190.  Her stress echo at the hospital was negative with hypertensive PB response.  Home Medications  Prior to Admission medications   Medication Sig Start Date End Date Taking? Authorizing Provider  aspirin EC 81 MG EC tablet Take 1 tablet (81 mg total) by mouth daily. 11/09/13  Yes Leroy Sea, MD  benazepril-hydrochlorthiazide (LOTENSIN HCT) 20-12.5 MG per tablet Take 1 tablet by mouth daily.   Yes Historical Provider, MD  carvedilol (COREG) 6.25 MG tablet Take 1 tablet (6.25 mg total) by mouth 2 (two) times daily with a meal. 11/09/13  Yes Leroy Sea, MD  nitroGLYCERIN (NITROSTAT) 0.4 MG SL tablet Place 1 tablet (0.4 mg total) under the tongue every 5 (five) minutes as needed for chest pain (CP or SOB). 11/09/13  Yes Leroy Sea, MD  omeprazole (PRILOSEC) 20 MG capsule Take 20 mg by mouth daily.   Yes Historical Provider, MD  oxyCODONE-acetaminophen (PERCOCET/ROXICET) 5-325 MG per tablet Take 0.5 tablets by mouth daily as needed for moderate pain or severe pain (back pain from recent surgery).  Yes Historical Provider, MD  simvastatin (ZOCOR) 20 MG tablet Take 20 mg by mouth at bedtime.   Yes Historical Provider, MD    Family History  Family History  Problem Relation Age of Onset  . CVA Father     Social History  History   Social History  . Marital Status: Married    Spouse Name: N/A    Number of Children: N/A  . Years of Education: N/A   Occupational History  . Not on file.   Social  History Main Topics  . Smoking status: Never Smoker   . Smokeless tobacco: Not on file  . Alcohol Use: No  . Drug Use: No  . Sexual Activity: Not on file   Other Topics Concern  . Not on file   Social History Narrative  . No narrative on file     Review of Systems, as per HPI, otherwise negative General:  No chills, fever, night sweats or weight changes.  Cardiovascular:  No chest pain, dyspnea on exertion, edema, orthopnea, palpitations, paroxysmal nocturnal dyspnea. Dermatological: No rash, lesions/masses Respiratory: No cough, dyspnea Urologic: No hematuria, dysuria Abdominal:   No nausea, vomiting, diarrhea, bright red blood per rectum, melena, or hematemesis Neurologic:  No visual changes, wkns, changes in mental status. All other systems reviewed and are otherwise negative except as noted above.  Physical Exam  Blood pressure 152/90, pulse 64, height 5\' 2"  (1.575 m), weight 169 lb (76.658 kg).  General: Pleasant, NAD Psych: Normal affect. Neuro: Alert and oriented X 3. Moves all extremities spontaneously. HEENT: Normal  Neck: Supple without bruits or JVD. Lungs:  Resp regular and unlabored, CTA. Heart: RRR no s3, s4, or murmurs. Abdomen: Soft, non-tender, non-distended, BS + x 4.  Extremities: No clubbing, cyanosis or edema. DP/PT/Radials 2+ and equal bilaterally.  Labs:  No results found for this basename: CKTOTAL, CKMB, TROPONINI,  in the last 72 hours Lab Results  Component Value Date   WBC 7.8 11/09/2013   HGB 13.0 11/09/2013   HCT 39.2 11/09/2013   MCV 88.5 11/09/2013   PLT 295 11/09/2013        Component Value Date/Time   NA 141 11/08/2013 1638   K 3.8 11/08/2013 1638   CL 100 11/08/2013 1638   CO2 26 11/08/2013 1638   GLUCOSE 114* 11/08/2013 1638   BUN 19 11/08/2013 1638   CREATININE 0.63 11/08/2013 1638   CALCIUM 10.0 11/08/2013 1638   GFRNONAA >90 11/08/2013 1638   GFRAA >90 11/08/2013 1638   No results found for this basename: CHOL, HDL, LDLCALC, TRIG     Accessory Clinical Findings  Echocardiogram - stress 11/09/2013  - Stress ECG conclusions: There were no stress arrhythmias or conduction abnormalities. The stress ECG was negative for ischemia. - Staged echo: Normal echo stress Impressions: - Normal study after maximal exercise. Hypertensive stress response.    Assessment & Plan  58 year old female with prior medical history of hypertensive urgency, hyperlipidemia   1. exertional chest pain - possibly related to hypertensive urgency, blood pressure on presentation 200/110 and relieved with decrease of her blood pressure. An exercise stress echocardiogram showed no ischemia but BP raised to 220/75.  2. Hypertension - uncontrolled today, but no BP meds taken today yet. We will switch carvedilol to amlodipine as she is hypertensive and HR 64 that doesn't allow us to increase the dose, p[lus she is complaining of fatigue after taking coreq.  We will check CMET today and renal arterial Duplex.   3. Hyperlipidemia -  followed by primary care physician currently on simvastatin 20 mg daily.  Follow up in 1 month.  Lars Masson, MD, [facility] 11/14/2013, 8:58 AM

## 2013-11-14 NOTE — Patient Instructions (Signed)
Your physician has recommended you make the following change in your medication:   1. Stop Carvedilol.   2. Start Amlodipine 5 mg 1 tablet daily.  Your physician recommends that you return for lab work today for Cmet and CBC.  Your physician recommends that you schedule a follow-up appointment in: 4 weeks with Dr. Delton SeeNelson.  Your physician has requested that you have a renal artery duplex. During this test, an ultrasound is used to evaluate blood flow to the kidneys. Allow one hour for this exam. Do not eat after midnight the day before and avoid carbonated beverages. Take your medications as you usually do.

## 2013-11-17 ENCOUNTER — Ambulatory Visit (HOSPITAL_COMMUNITY): Payer: No Typology Code available for payment source | Attending: Cardiology | Admitting: Cardiology

## 2013-11-17 ENCOUNTER — Encounter (HOSPITAL_COMMUNITY): Payer: Self-pay | Admitting: Cardiology

## 2013-11-17 ENCOUNTER — Encounter: Payer: Self-pay | Admitting: Cardiology

## 2013-11-17 DIAGNOSIS — I10 Essential (primary) hypertension: Secondary | ICD-10-CM

## 2013-11-17 NOTE — Progress Notes (Signed)
Renal artery duplex complete 

## 2013-12-07 ENCOUNTER — Encounter: Payer: No Typology Code available for payment source | Admitting: Cardiology

## 2013-12-14 ENCOUNTER — Ambulatory Visit: Payer: No Typology Code available for payment source | Admitting: Cardiology

## 2013-12-15 ENCOUNTER — Other Ambulatory Visit: Payer: Self-pay | Admitting: *Deleted

## 2013-12-20 ENCOUNTER — Encounter: Payer: Self-pay | Admitting: Cardiology

## 2013-12-20 ENCOUNTER — Ambulatory Visit (INDEPENDENT_AMBULATORY_CARE_PROVIDER_SITE_OTHER): Payer: No Typology Code available for payment source | Admitting: Cardiology

## 2013-12-20 VITALS — BP 132/74 | HR 84 | Ht 62.0 in | Wt 168.0 lb

## 2013-12-20 DIAGNOSIS — I10 Essential (primary) hypertension: Secondary | ICD-10-CM

## 2013-12-20 NOTE — Progress Notes (Signed)
Patient ID: Amanda RocherRebecca B Biever, female   DOB: August 12, 1955, 58 y.o.   MRN: [id number]     [name] Date of Encounter: 12/20/2013  Primary Care Provider:  Vonita MossRISSMAN,MARK, MD Primary Cardiologist:  Lars MassonKatarina H Rosevelt Luu  Problem List   Past Medical History  Diagnosis Date  . Hypertension   . GERD (gastroesophageal reflux disease)   . History of anxiety     had anxiety attacks  . History of kidney stones   . Trigeminal neuralgia   . HNP (herniated nucleus pulposus), lumbar 05/02/2013  . Dyslipidemia 11/08/2013  . Chest pain 11/08/2013   Past Surgical History  Procedure Laterality Date  . Abdominal hysterectomy    . Gamma knife  1999    for trigeminal neuralgia  . Brain surgery      for trigeminal neuralgia  . Lumbar laminectomy/decompression microdiscectomy  04/19/2003    Right L5-S1 intralaminar laminotomy for excision of herniated disk with the operating microscope  . Lumbar laminectomy/decompression microdiscectomy Right 05/02/2013    Procedure: Right Lumbar Five Sacral One Microdiskectomy;  Surgeon: Temple PaciniHenry A Pool, MD;  Location: [facility];  Service: Neurosurgery;  Laterality: Right;  LUMBAR LAMINECTOMY/DECOMPRESSION MICRODISCECTOMY 1 LEVEL    Allergies  Allergies  Allergen Reactions  . Prednisone Nausea And Vomiting  . Sulfa Antibiotics Nausea And Vomiting    HPI  Amanda Lyons is a 58 y.o. female, hypertension, dyslipidemia, GERD, chronic back pain status post spine surgery, who has been noticing high blood pressures at home for the last few days, also is noticing some exertional shortness of breath for the last few days at work was today driving and noticed substernal chest heaviness along with some palpitations and shortness of breath, this lasted for about 2 hours, chest heaviness was nonradiating, aggravated by exertion relieved by rest, she called EMS who arrived and gave her nitroglycerin with chest pain relief, then came to the ER where she was noticed to  have high blood pressure, unremarkable EKG, chest x-ray and blood work and I was called to admit the patient.  The patient had similar presentation approximately 10 years ago when she was diagnosed with hypertension and [facility]. Stress test was negative and her symptoms resolved after her blood pressure was controlled.  After EMS was called her blood pressure was 201/110 and she gets some relief with sublingual nitroglycerin that brought her blood pressure to 190.  Her stress echo at the hospital was negative with hypertensive PB response.  The patient is coming after 1 month, her chest pain and heaviness has resolved, but she still feels SOB on moderate exertion and feels like lacking energy on some days. She has started to walk but not on a regular basis yet.   Home Medications  Prior to Admission medications   Medication Sig Start Date End Date Taking? Authorizing Provider  aspirin EC 81 MG EC tablet Take 1 tablet (81 mg total) by mouth daily. 11/09/13  Yes Leroy SeaPrashant K Singh, MD  benazepril-hydrochlorthiazide (LOTENSIN HCT) 20-12.5 MG per tablet Take 1 tablet by mouth daily.   Yes Historical Provider, MD  carvedilol (COREG) 6.25 MG tablet Take 1 tablet (6.25 mg total) by mouth 2 (two) times daily with a meal. 11/09/13  Yes Leroy SeaPrashant K Singh, MD  nitroGLYCERIN (NITROSTAT) 0.4 MG SL tablet Place 1 tablet (0.4 mg total) under the tongue every 5 (five) minutes as needed for chest pain (CP or SOB). 11/09/13  Yes Leroy SeaPrashant K Singh, MD  omeprazole (PRILOSEC) 20 MG capsule Take 20 mg by mouth daily.   Yes Historical Provider, MD  oxyCODONE-acetaminophen (PERCOCET/ROXICET) 5-325 MG per tablet Take 0.5 tablets by mouth daily as needed for moderate pain or severe pain (back pain from recent surgery).   Yes Historical Provider, MD  simvastatin (ZOCOR) 20 MG tablet Take 20 mg by mouth at bedtime.   Yes Historical Provider, MD    Family History  Family History  Problem Relation Age of  Onset  . CVA Father     Social History  History   Social History  . Marital Status: Married    Spouse Name: N/A    Number of Children: N/A  . Years of Education: N/A   Occupational History  . Not on file.   Social History Main Topics  . Smoking status: Never Smoker   . Smokeless tobacco: Not on file  . Alcohol Use: No  . Drug Use: No  . Sexual Activity: Not on file   Other Topics Concern  . Not on file   Social History Narrative  . No narrative on file     Review of Systems, as per HPI, otherwise negative General:  No chills, fever, night sweats or weight changes.  Cardiovascular:  No chest pain, dyspnea on exertion, edema, orthopnea, palpitations, paroxysmal nocturnal dyspnea. Dermatological: No rash, lesions/masses Respiratory: No cough, dyspnea Urologic: No hematuria, dysuria Abdominal:   No nausea, vomiting, diarrhea, bright red blood per rectum, melena, or hematemesis Neurologic:  No visual changes, wkns, changes in mental status. All other systems reviewed and are otherwise negative except as noted above.  Physical Exam  Blood pressure 132/74, pulse 84, height 5\' 2"  (1.575 m), weight 168 lb (76.204 kg).  General: Pleasant, NAD Psych: Normal affect. Neuro: Alert and oriented X 3. Moves all extremities spontaneously. HEENT: Normal  Neck: Supple without bruits or JVD. Lungs:  Resp regular and unlabored, CTA. Heart: RRR no s3, s4, or murmurs. Abdomen: Soft, non-tender, non-distended, BS + x 4.  Extremities: No clubbing, cyanosis or edema. DP/PT/Radials 2+ and equal bilaterally.  Labs:  No results found for this basename: CKTOTAL, CKMB, TROPONINI,  in the last 72 hours Lab Results  Component Value Date   WBC 6.6 11/14/2013   HGB 13.6 11/14/2013   HCT 40.7 11/14/2013   MCV 88.4 11/14/2013   PLT 341.0 11/14/2013        Component Value Date/Time   NA 139 11/14/2013 0926   K 4.1 11/14/2013 0926   CL 102 11/14/2013 0926   CO2 29 11/14/2013 0926   GLUCOSE 105*  11/14/2013 0926   BUN 21 11/14/2013 0926   CREATININE 0.6 11/14/2013 0926   CALCIUM 10.0 11/14/2013 0926   PROT 7.3 11/14/2013 0926   ALBUMIN 3.9 11/14/2013 0926   AST 18 11/14/2013 0926   ALT 26 11/14/2013 0926   ALKPHOS 117 11/14/2013 0926   BILITOT 0.4 11/14/2013 0926   GFRNONAA >90 11/08/2013 1638   GFRAA >90 11/08/2013 1638   No results found for this basename: CHOL,  HDL,  LDLCALC,  TRIG    Accessory Clinical Findings  Echocardiogram - stress 11/09/2013  - Stress ECG conclusions: There were no stress arrhythmias or conduction abnormalities. The stress ECG was negative for ischemia. - Staged echo: Normal echo stress Impressions: - Normal study after maximal exercise. Hypertensive stress response.    Assessment & Plan  58 year old female with prior medical history of hypertensive urgency, hyperlipidemia   1. Exertional chest pain - resolved, persistent DOE,I  believe its related to related to hypertensive response on exertion and deconditioning, she is encouraged to continue walking daily.   2. Hypertension - blood pressure on presentation 200/110 and relieved with decrease of her blood pressure. An exercise stress echocardiogram showed no ischemia but BP raised to 220/75.  Controlled today and also in the last 10 days based on a diary she brought from home.  Continue amlodipine as she is hypertensive and HR 64 that doesn't allow [place] to increase the dose, plus she was complaining of fatigue after taking coreq. Renal artery [place] was normal.  3. Hyperlipidemia - followed by primary care physician currently on simvastatin 20 mg daily.  Follow up in 6 months.  Lars Masson, MD, [facility] 12/20/2013, 8:24 AM

## 2013-12-20 NOTE — Patient Instructions (Signed)
Your physician recommends that you continue on your current medications as directed. Please refer to the Current Medication list given to you today.   Your physician wants you to follow-up in: 4 MONTHS WITH DR NELSON You will receive a reminder letter in the mail two months in advance. If you don't receive a letter, please call our office to schedule the follow-up appointment.  

## 2014-01-01 ENCOUNTER — Other Ambulatory Visit (HOSPITAL_COMMUNITY): Payer: Self-pay | Admitting: Internal Medicine

## 2014-04-27 ENCOUNTER — Ambulatory Visit: Payer: No Typology Code available for payment source | Admitting: Cardiology

## 2014-05-29 ENCOUNTER — Other Ambulatory Visit: Payer: Self-pay

## 2014-05-29 MED ORDER — AMLODIPINE BESYLATE 5 MG PO TABS: 5.0000 mg | ORAL_TABLET | Freq: Every day | ORAL | Status: DC

## 2014-08-23 ENCOUNTER — Emergency Department: Payer: Self-pay | Admitting: Emergency Medicine

## 2014-08-23 LAB — CBC
HCT: 41 % (ref 35.0–47.0)
HGB: 13.4 g/dL (ref 12.0–16.0)
MCH: 29.1 pg (ref 26.0–34.0)
MCHC: 32.7 g/dL (ref 32.0–36.0)
MCV: 89 fL (ref 80–100)
Platelet: 331 10*3/uL (ref 150–440)
RBC: 4.6 10*6/uL (ref 3.80–5.20)
RDW: 13.3 % (ref 11.5–14.5)
WBC: 7.3 10*3/uL (ref 3.6–11.0)

## 2014-08-23 LAB — BASIC METABOLIC PANEL
Anion Gap: 6 — ABNORMAL LOW (ref 7–16)
BUN: 17 mg/dL (ref 7–18)
Calcium, Total: 9 mg/dL (ref 8.5–10.1)
Chloride: 107 mmol/L (ref 98–107)
Co2: 28 mmol/L (ref 21–32)
Creatinine: 0.75 mg/dL (ref 0.60–1.30)
EGFR (Non-African Amer.): >60
GLUCOSE: 160 mg/dL — AB (ref 65–99)
Osmolality: 286 (ref 275–301)
POTASSIUM: 3.4 mmol/L — AB (ref 3.5–5.1)
SODIUM: 141 mmol/L (ref 136–145)

## 2014-08-23 LAB — TROPONIN I: Troponin-I: <0.02 ng/mL

## 2014-09-19 ENCOUNTER — Ambulatory Visit: Payer: No Typology Code available for payment source | Admitting: Cardiology

## 2014-10-09 ENCOUNTER — Ambulatory Visit (INDEPENDENT_AMBULATORY_CARE_PROVIDER_SITE_OTHER): Payer: No Typology Code available for payment source | Admitting: Cardiology

## 2014-10-09 ENCOUNTER — Encounter: Payer: Self-pay | Admitting: Cardiology

## 2014-10-09 VITALS — BP 142/82 | HR 83 | Ht 62.0 in | Wt 174.0 lb

## 2014-10-09 DIAGNOSIS — Z952 Presence of prosthetic heart valve: Secondary | ICD-10-CM

## 2014-10-09 DIAGNOSIS — I10 Essential (primary) hypertension: Secondary | ICD-10-CM

## 2014-10-09 DIAGNOSIS — R002 Palpitations: Secondary | ICD-10-CM

## 2014-10-09 DIAGNOSIS — Z954 Presence of other heart-valve replacement: Secondary | ICD-10-CM

## 2014-10-09 DIAGNOSIS — R079 Chest pain, unspecified: Secondary | ICD-10-CM

## 2014-10-09 MED ORDER — METOPROLOL SUCCINATE ER 25 MG PO TB24: 25.0000 mg | ORAL_TABLET | Freq: Every day | ORAL | Status: DC

## 2014-10-09 NOTE — Patient Instructions (Addendum)
Your physician has recommended you make the following change in your medication:  1. START Metoprolol Succinate 25mg  take one by mouth daily  Your physician has requested that you have a cardiac catheterization. Cardiac catheterization is used to diagnose and/or treat various heart conditions. Doctors may recommend this procedure for a number of different reasons. The most common reason is to evaluate chest pain. Chest pain can be a symptom of coronary artery disease (CAD), and cardiac catheterization can show whether plaque is narrowing or blocking your heart's arteries. This procedure is also used to evaluate the valves, as well as measure the blood flow and oxygen levels in different parts of your heart. For further information please visit [email or web address] Please follow instruction sheet, as given.  Your physician recommends that you have lab work today: BMP, LIVER, CBC and PT/INR  Your physician has recommended that you wear a 48 hour holter monitor. Holter monitors are medical devices that record the heart's electrical activity. Doctors most often use these monitors to diagnose arrhythmias. Arrhythmias are problems with the speed or rhythm of the heartbeat. The monitor is a small, portable device. You can wear one while you do your normal daily activities. This is usually used to diagnose what is causing palpitations/syncope (passing out).  Your physician recommends that you schedule a follow-up appointment in: 1 MONTH with Dr Delton SeeNelson

## 2014-10-09 NOTE — Progress Notes (Signed)
Patient ID: Amanda Lyons, female   DOB: 12/03/1955, 59 y.o.   MRN: [id number]    [name] Date of Encounter: 10/09/2014  Primary Care Provider:  CRISSMAN, MARK, MD Primary Cardiologist:  Haruka Kowaleski H  Problem List   Past Medical History  Diagnosis Date  . Hypertension   . GERD (gastroesophageal reflux disease)   . History of anxiety     had anxiety attacks  . History of kidney stones   . Trigeminal neuralgia   . HNP (herniated nucleus pulposus), lumbar 05/02/2013  . Dyslipidemia 11/08/2013  . Chest pain 11/08/2013   Past Surgical History  Procedure Laterality Date  . Abdominal hysterectomy    . Gamma knife  1999    for trigeminal neuralgia  . Brain surgery      for trigeminal neuralgia  . Lumbar laminectomy/decompression microdiscectomy  04/19/2003    Right L5-S1 intralaminar laminotomy for excision of herniated disk with the operating microscope  . Lumbar laminectomy/decompression microdiscectomy Right 05/02/2013    Procedure: Right Lumbar Five Sacral One Microdiskectomy;  Surgeon: Henry A Pool, MD;  Location: [facility];  Service: Neurosurgery;  Laterality: Right;  LUMBAR LAMINECTOMY/DECOMPRESSION MICRODISCECTOMY 1 LEVEL   Allergies  Allergies  Allergen Reactions  . Prednisone Nausea And Vomiting  . Sulfa Antibiotics Nausea And Vomiting    HPI  Amanda Lyons is a 59 y.o. female, hypertension, dyslipidemia, GERD, chronic back pain status post spine surgery, who has been noticing high blood pressures at home for the last few days, also is noticing some exertional shortness of breath for the last few days at work was today driving and noticed substernal chest heaviness along with some palpitations and shortness of breath, this lasted for about 2 hours, chest heaviness was nonradiating, aggravated by exertion relieved by rest, she called EMS who arrived and gave her nitroglycerin with chest pain relief, then came to the ER where she was noticed to have  high blood pressure, unremarkable EKG, chest x-ray and blood work and I was called to admit the patient.  The patient had similar presentation approximately 10 years ago when she was diagnosed with hypertension and underwent a stress test at [facility]. Stress test was negative and her symptoms resolved after her blood pressure was controlled.  After EMS was called her blood pressure was 201/110 and she gets some relief with sublingual nitroglycerin that brought her blood pressure to 190.  Her stress echo at the hospital was negative with hypertensive PB response.  12/20/2013 - The patient is coming after 1 month, her chest pain and heaviness has resolved, but she still feels SOB on moderate exertion and feels like lacking energy on some days. She has started to walk but not on a regular basis yet.   10/09/2014 - the patient is coming after 8 months, she complains of progressively worsening exertional chest pain and , now with minimal exertion that prevents her to perform activities of daily living. At the presentation last year her chest pain was believed to be secondary to hypertensive urgency however her blood pressure is now much better controlled and her symptoms are worsening. She has been compliant to her meds. She has also been experiencing significant palpitations, last Friday lasting 5-6 hours. She experiences these almost every day.  Home Medications  Prior to Admission medications   Medication Sig Start Date End Date Taking? Authorizing Provider  aspirin EC 81 MG EC tablet Take 1 tablet (81 mg total) by mouth daily. 11/09/13    Yes Prashant K Singh, MD  benazepril-hydrochlorthiazide (LOTENSIN HCT) 20-12.5 MG per tablet Take 1 tablet by mouth daily.   Yes Historical Provider, MD  carvedilol (COREG) 6.25 MG tablet Take 1 tablet (6.25 mg total) by mouth 2 (two) times daily with a meal. 11/09/13  Yes Prashant K Singh, MD  nitroGLYCERIN (NITROSTAT) 0.4 MG SL tablet Place 1 tablet (0.4 mg total) under the  tongue every 5 (five) minutes as needed for chest pain (CP or SOB). 11/09/13  Yes Prashant K Singh, MD  omeprazole (PRILOSEC) 20 MG capsule Take 20 mg by mouth daily.   Yes Historical Provider, MD  oxyCODONE-acetaminophen (PERCOCET/ROXICET) 5-325 MG per tablet Take 0.5 tablets by mouth daily as needed for moderate pain or severe pain (back pain from recent surgery).   Yes Historical Provider, MD  simvastatin (ZOCOR) 20 MG tablet Take 20 mg by mouth at bedtime.   Yes Historical Provider, MD    Family History  Family History  Problem Relation Age of Onset  . CVA Father     Social History  History   Social History  . Marital Status: Married    Spouse Name: N/A  . Number of Children: N/A  . Years of Education: N/A   Occupational History  . Not on file.   Social History Main Topics  . Smoking status: Never Smoker   . Smokeless tobacco: Not on file  . Alcohol Use: No  . Drug Use: No  . Sexual Activity: Not on file   Other Topics Concern  . Not on file   Social History Narrative     Review of Systems, as per HPI, otherwise negative General:  No chills, fever, night sweats or weight changes.  Cardiovascular:  No chest pain, dyspnea on exertion, edema, orthopnea, palpitations, paroxysmal nocturnal dyspnea. Dermatological: No rash, lesions/masses Respiratory: No cough, dyspnea Urologic: No hematuria, dysuria Abdominal:   No nausea, vomiting, diarrhea, bright red blood per rectum, melena, or hematemesis Neurologic:  No visual changes, wkns, changes in mental status. All other systems reviewed and are otherwise negative except as noted above.  Physical Exam  Blood pressure 142/82, pulse 83, height 5' 2" (1.575 m), weight 174 lb (78.926 kg).  General: Pleasant, NAD Psych: Normal affect. Neuro: Alert and oriented X 3. Moves all extremities spontaneously. HEENT: Normal  Neck: Supple without bruits or JVD. Lungs:  Resp regular and unlabored, CTA. Heart: RRR no s3, s4, or  murmurs. Abdomen: Soft, non-tender, non-distended, BS + x 4.  Extremities: No clubbing, cyanosis or edema. DP/PT/Radials 2+ and equal bilaterally.  Labs:  No results for input(s): CKTOTAL, CKMB, TROPONINI in the last 72 hours. Lab Results  Component Value Date   WBC 6.6 11/14/2013   HGB 13.6 11/14/2013   HCT 40.7 11/14/2013   MCV 88.4 11/14/2013   PLT 341.0 11/14/2013        Component Value Date/Time   NA 139 11/14/2013 0926   K 4.1 11/14/2013 0926   CL 102 11/14/2013 0926   CO2 29 11/14/2013 0926   GLUCOSE 105* 11/14/2013 0926   BUN 21 11/14/2013 0926   CREATININE 0.6 11/14/2013 0926   CALCIUM 10.0 11/14/2013 0926   PROT 7.3 11/14/2013 0926   ALBUMIN 3.9 11/14/2013 0926   AST 18 11/14/2013 0926   ALT 26 11/14/2013 0926   ALKPHOS 117 11/14/2013 0926   BILITOT 0.4 11/14/2013 0926   GFRNONAA >90 11/08/2013 1638   GFRAA >90 11/08/2013 1638   No results found for: CHOL  Accessory Clinical   Findings  Echocardiogram - stress 11/09/2013  - Stress ECG conclusions: There were no stress arrhythmias or conduction abnormalities. The stress ECG was negative for ischemia. - Staged echo: Normal echo stress Impressions: - Normal study after maximal exercise. Hypertensive stress response.  ECG: NSR, anterior infarct, age undetermined, negative T waves in the anterolateral leads.    Assessment & Plan  58-year-old female with prior medical history of hypertensive urgency, hyperlipidemia   1. Exertional chest pain and shortness of breath - progressively worsening, EKG shows nonspecific changes, we'll schedule patient for left cardiac catheterization. We will continue aspirin, simvastatin, benazepril, and at Toprol XL 25 mg daily.  2. Hypertension - better control than last year (hypertensive urgency with BP 200/110 mmHg), we will add toprol XL 25 mg po daily. Renal artery US was normal.  3. Hyperlipidemia - followed by primary care physician currently on simvastatin 20 mg  daily.  Follow up in 1 month.  Kashaun Bebo H, MD, FACC 10/09/2014, 3:21 PM     

## 2014-10-10 LAB — BASIC METABOLIC PANEL
BUN: 17 mg/dL (ref 6–23)
CO2: 28 mEq/L (ref 19–32)
Calcium: 10.3 mg/dL (ref 8.4–10.5)
Chloride: 103 mEq/L (ref 96–112)
Creatinine, Ser: 0.75 mg/dL (ref 0.40–1.20)
GFR: 84.65 mL/min (ref >60.00)
Glucose, Bld: 135 mg/dL — ABNORMAL HIGH (ref 70–99)
Potassium: 3.8 mEq/L (ref 3.5–5.1)
Sodium: 138 mEq/L (ref 135–145)

## 2014-10-10 LAB — CBC
HCT: 40 % (ref 36.0–46.0)
Hemoglobin: 13.5 g/dL (ref 12.0–15.0)
MCHC: 33.8 g/dL (ref 30.0–36.0)
MCV: 86.1 fl (ref 78.0–100.0)
Platelets: 352 10*3/uL (ref 150.0–400.0)
RBC: 4.64 Mil/uL (ref 3.87–5.11)
RDW: 13.4 % (ref 11.5–15.5)
WBC: 8 10*3/uL (ref 4.0–10.5)

## 2014-10-10 LAB — HEPATIC FUNCTION PANEL
ALT: 29 U/L (ref 0–35)
AST: 18 U/L (ref 0–37)
Albumin: 4.2 g/dL (ref 3.5–5.2)
Alkaline Phosphatase: 130 U/L — ABNORMAL HIGH (ref 39–117)
Bilirubin, Direct: 0.1 mg/dL (ref 0.0–0.3)
Total Bilirubin: 0.3 mg/dL (ref 0.2–1.2)
Total Protein: 7.4 g/dL (ref 6.0–8.3)

## 2014-10-10 LAB — PROTIME-INR
INR: 1 ratio (ref 0.8–1.0)
Prothrombin Time: 10.8 s (ref 9.6–13.1)

## 2014-10-11 ENCOUNTER — Ambulatory Visit (HOSPITAL_COMMUNITY)
Admission: RE | Admit: 2014-10-11 | Discharge: 2014-10-11 | Disposition: A | Discharge status: Home / Self Care | Payer: No Typology Code available for payment source | Source: Ambulatory Visit | Attending: Interventional Cardiology | Admitting: Interventional Cardiology

## 2014-10-11 ENCOUNTER — Encounter (HOSPITAL_COMMUNITY): Payer: Self-pay | Admitting: Interventional Cardiology

## 2014-10-11 ENCOUNTER — Encounter (HOSPITAL_COMMUNITY)
Admission: RE | Disposition: A | Discharge status: Home / Self Care | Payer: Self-pay | Source: Ambulatory Visit | Attending: Interventional Cardiology

## 2014-10-11 DIAGNOSIS — I209 Angina pectoris, unspecified: Secondary | ICD-10-CM

## 2014-10-11 DIAGNOSIS — G500 Trigeminal neuralgia: Secondary | ICD-10-CM | POA: Diagnosis not present

## 2014-10-11 DIAGNOSIS — Z87442 Personal history of urinary calculi: Secondary | ICD-10-CM | POA: Insufficient documentation

## 2014-10-11 DIAGNOSIS — Z90710 Acquired absence of both cervix and uterus: Secondary | ICD-10-CM | POA: Insufficient documentation

## 2014-10-11 DIAGNOSIS — E785 Hyperlipidemia, unspecified: Secondary | ICD-10-CM | POA: Insufficient documentation

## 2014-10-11 DIAGNOSIS — I10 Essential (primary) hypertension: Secondary | ICD-10-CM | POA: Diagnosis present

## 2014-10-11 DIAGNOSIS — F419 Anxiety disorder, unspecified: Secondary | ICD-10-CM | POA: Diagnosis not present

## 2014-10-11 DIAGNOSIS — Z7982 Long term (current) use of aspirin: Secondary | ICD-10-CM | POA: Insufficient documentation

## 2014-10-11 DIAGNOSIS — R0602 Shortness of breath: Secondary | ICD-10-CM | POA: Diagnosis not present

## 2014-10-11 DIAGNOSIS — K219 Gastro-esophageal reflux disease without esophagitis: Secondary | ICD-10-CM | POA: Diagnosis not present

## 2014-10-11 DIAGNOSIS — Z888 Allergy status to other drugs, medicaments and biological substances status: Secondary | ICD-10-CM | POA: Diagnosis not present

## 2014-10-11 DIAGNOSIS — R079 Chest pain, unspecified: Secondary | ICD-10-CM | POA: Diagnosis present

## 2014-10-11 DIAGNOSIS — Z882 Allergy status to sulfonamides status: Secondary | ICD-10-CM | POA: Insufficient documentation

## 2014-10-11 DIAGNOSIS — M5126 Other intervertebral disc displacement, lumbar region: Secondary | ICD-10-CM | POA: Diagnosis not present

## 2014-10-11 HISTORY — PX: LEFT HEART CATHETERIZATION WITH CORONARY ANGIOGRAM: SHX5451

## 2014-10-11 LAB — CK TOTAL AND CKMB (NOT AT ARMC)
CK, MB: 2.2 ng/mL (ref 0.3–4.0)
Relative Index: 1.7 (ref 0.0–2.5)
Total CK: 129 U/L (ref 7–177)

## 2014-10-11 SURGERY — LEFT HEART CATHETERIZATION WITH CORONARY ANGIOGRAM

## 2014-10-11 MED ORDER — ONDANSETRON HCL 4 MG/2ML IJ SOLN: 4.0000 mg | Freq: Four times a day (QID) | INTRAMUSCULAR | Status: DC | PRN

## 2014-10-11 MED ORDER — SODIUM CHLORIDE 0.9 % IJ SOLN: 3.0000 mL | INTRAMUSCULAR | Status: DC | PRN

## 2014-10-11 MED ORDER — SODIUM CHLORIDE 0.9 % IV SOLN
INTRAVENOUS | Status: DC
  Administered 2014-10-11: 15:00:00 via INTRAVENOUS

## 2014-10-11 MED ORDER — MIDAZOLAM HCL 2 MG/2ML IJ SOLN
INTRAMUSCULAR | Status: AC
  Filled 2014-10-11: qty 2

## 2014-10-11 MED ORDER — SODIUM CHLORIDE 0.9 % IV SOLN
INTRAVENOUS | Status: DC
  Administered 2014-10-11: 10:00:00 via INTRAVENOUS

## 2014-10-11 MED ORDER — HEPARIN (PORCINE) IN NACL 2-0.9 UNIT/ML-% IJ SOLN
INTRAMUSCULAR | Status: AC
  Filled 2014-10-11: qty 1500

## 2014-10-11 MED ORDER — SODIUM CHLORIDE 0.9 % IJ SOLN: 3.0000 mL | Freq: Two times a day (BID) | INTRAMUSCULAR | Status: DC

## 2014-10-11 MED ORDER — OXYCODONE-ACETAMINOPHEN 5-325 MG PO TABS: 1.0000 | ORAL_TABLET | ORAL | Status: DC | PRN

## 2014-10-11 MED ORDER — LIDOCAINE HCL (PF) 1 % IJ SOLN
INTRAMUSCULAR | Status: AC
  Filled 2014-10-11: qty 30

## 2014-10-11 MED ORDER — ASPIRIN 81 MG PO CHEW: 81.0000 mg | CHEWABLE_TABLET | ORAL | Status: DC

## 2014-10-11 MED ORDER — ACETAMINOPHEN 325 MG PO TABS: 650.0000 mg | ORAL_TABLET | ORAL | Status: DC | PRN

## 2014-10-11 MED ORDER — FENTANYL CITRATE 0.05 MG/ML IJ SOLN
INTRAMUSCULAR | Status: AC
Start: 2014-10-11 — End: 2014-10-11
  Filled 2014-10-11: qty 2

## 2014-10-11 MED ORDER — SODIUM CHLORIDE 0.9 % IV SOLN: 250.0000 mL | INTRAVENOUS | Status: DC | PRN

## 2014-10-11 NOTE — H&P (View-Only) (Signed)
Patient ID: Amanda Lyons, female   DOB: Dec 16, 1955, 59 y.o.   MRN: [id number]    [name] Date of Encounter: 10/09/2014  Primary Care Provider:  Vonita Moss, MD Primary Cardiologist:  Lars Masson  Problem List   Past Medical History  Diagnosis Date  . Hypertension   . GERD (gastroesophageal reflux disease)   . History of anxiety     had anxiety attacks  . History of kidney stones   . Trigeminal neuralgia   . HNP (herniated nucleus pulposus), lumbar 05/02/2013  . Dyslipidemia 11/08/2013  . Chest pain 11/08/2013   Past Surgical History  Procedure Laterality Date  . Abdominal hysterectomy    . Gamma knife  1999    for trigeminal neuralgia  . Brain surgery      for trigeminal neuralgia  . Lumbar laminectomy/decompression microdiscectomy  04/19/2003    Right L5-S1 intralaminar laminotomy for excision of herniated disk with the operating microscope  . Lumbar laminectomy/decompression microdiscectomy Right 05/02/2013    Procedure: Right Lumbar Five Sacral One Microdiskectomy;  Surgeon: Temple Pacini, MD;  Location: [facility];  Service: Neurosurgery;  Laterality: Right;  LUMBAR LAMINECTOMY/DECOMPRESSION MICRODISCECTOMY 1 LEVEL   Allergies  Allergies  Allergen Reactions  . Prednisone Nausea And Vomiting  . Sulfa Antibiotics Nausea And Vomiting    HPI  Amanda Lyons is a 59 y.o. female, hypertension, dyslipidemia, GERD, chronic back pain status post spine surgery, who has been noticing high blood pressures at home for the last few days, also is noticing some exertional shortness of breath for the last few days at work was today driving and noticed substernal chest heaviness along with some palpitations and shortness of breath, this lasted for about 2 hours, chest heaviness was nonradiating, aggravated by exertion relieved by rest, she called EMS who arrived and gave her nitroglycerin with chest pain relief, then came to the ER where she was noticed to have  high blood pressure, unremarkable EKG, chest x-ray and blood work and I was called to admit the patient.  The patient had similar presentation approximately 10 years ago when she was diagnosed with hypertension and underwent a stress test at [facility]. Stress test was negative and her symptoms resolved after her blood pressure was controlled.  After EMS was called her blood pressure was 201/110 and she gets some relief with sublingual nitroglycerin that brought her blood pressure to 190.  Her stress echo at the hospital was negative with hypertensive PB response.  12/20/2013 - The patient is coming after 1 month, her chest pain and heaviness has resolved, but she still feels SOB on moderate exertion and feels like lacking energy on some days. She has started to walk but not on a regular basis yet.   10/09/2014 - the patient is coming after 8 months, she complains of progressively worsening exertional chest pain and , now with minimal exertion that prevents her to perform activities of daily living. At the presentation last year her chest pain was believed to be secondary to hypertensive urgency however her blood pressure is now much better controlled and her symptoms are worsening. She has been compliant to her meds. She has also been experiencing significant palpitations, last Friday lasting 5-6 hours. She experiences these almost every day.  Home Medications  Prior to Admission medications   Medication Sig Start Date End Date Taking? Authorizing Provider  aspirin EC 81 MG EC tablet Take 1 tablet (81 mg total) by mouth daily. 11/09/13  Yes Leroy SeaPrashant K Singh, MD  benazepril-hydrochlorthiazide (LOTENSIN HCT) 20-12.5 MG per tablet Take 1 tablet by mouth daily.   Yes Historical Provider, MD  carvedilol (COREG) 6.25 MG tablet Take 1 tablet (6.25 mg total) by mouth 2 (two) times daily with a meal. 11/09/13  Yes Leroy SeaPrashant K Singh, MD  nitroGLYCERIN (NITROSTAT) 0.4 MG SL tablet Place 1 tablet (0.4 mg total) under the  tongue every 5 (five) minutes as needed for chest pain (CP or SOB). 11/09/13  Yes Leroy SeaPrashant K Singh, MD  omeprazole (PRILOSEC) 20 MG capsule Take 20 mg by mouth daily.   Yes Historical Provider, MD  oxyCODONE-acetaminophen (PERCOCET/ROXICET) 5-325 MG per tablet Take 0.5 tablets by mouth daily as needed for moderate pain or severe pain (back pain from recent surgery).   Yes Historical Provider, MD  simvastatin (ZOCOR) 20 MG tablet Take 20 mg by mouth at bedtime.   Yes Historical Provider, MD    Family History  Family History  Problem Relation Age of Onset  . CVA Father     Social History  History   Social History  . Marital Status: Married    Spouse Name: N/A  . Number of Children: N/A  . Years of Education: N/A   Occupational History  . Not on file.   Social History Main Topics  . Smoking status: Never Smoker   . Smokeless tobacco: Not on file  . Alcohol Use: No  . Drug Use: No  . Sexual Activity: Not on file   Other Topics Concern  . Not on file   Social History Narrative     Review of Systems, as per HPI, otherwise negative General:  No chills, fever, night sweats or weight changes.  Cardiovascular:  No chest pain, dyspnea on exertion, edema, orthopnea, palpitations, paroxysmal nocturnal dyspnea. Dermatological: No rash, lesions/masses Respiratory: No cough, dyspnea Urologic: No hematuria, dysuria Abdominal:   No nausea, vomiting, diarrhea, bright red blood per rectum, melena, or hematemesis Neurologic:  No visual changes, wkns, changes in mental status. All other systems reviewed and are otherwise negative except as noted above.  Physical Exam  Blood pressure 142/82, pulse 83, height 5\' 2"  (1.575 m), weight 174 lb (78.926 kg).  General: Pleasant, NAD Psych: Normal affect. Neuro: Alert and oriented X 3. Moves all extremities spontaneously. HEENT: Normal  Neck: Supple without bruits or JVD. Lungs:  Resp regular and unlabored, CTA. Heart: RRR no s3, s4, or  murmurs. Abdomen: Soft, non-tender, non-distended, BS + x 4.  Extremities: No clubbing, cyanosis or edema. DP/PT/Radials 2+ and equal bilaterally.  Labs:  No results for input(s): CKTOTAL, CKMB, TROPONINI in the last 72 hours. Lab Results  Component Value Date   WBC 6.6 11/14/2013   HGB 13.6 11/14/2013   HCT 40.7 11/14/2013   MCV 88.4 11/14/2013   PLT 341.0 11/14/2013        Component Value Date/Time   NA 139 11/14/2013 0926   K 4.1 11/14/2013 0926   CL 102 11/14/2013 0926   CO2 29 11/14/2013 0926   GLUCOSE 105* 11/14/2013 0926   BUN 21 11/14/2013 0926   CREATININE 0.6 11/14/2013 0926   CALCIUM 10.0 11/14/2013 0926   PROT 7.3 11/14/2013 0926   ALBUMIN 3.9 11/14/2013 0926   AST 18 11/14/2013 0926   ALT 26 11/14/2013 0926   ALKPHOS 117 11/14/2013 0926   BILITOT 0.4 11/14/2013 0926   GFRNONAA >90 11/08/2013 1638   GFRAA >90 11/08/2013 1638   No results found for: CHOL  Accessory Clinical  Findings  Echocardiogram - stress 11/09/2013  - Stress ECG conclusions: There were no stress arrhythmias or conduction abnormalities. The stress ECG was negative for ischemia. - Staged echo: Normal echo stress Impressions: - Normal study after maximal exercise. Hypertensive stress response.  ECG: NSR, anterior infarct, age undetermined, negative T waves in the anterolateral leads.    Assessment & Plan  59 year old female with prior medical history of hypertensive urgency, hyperlipidemia   1. Exertional chest pain and shortness of breath - progressively worsening, EKG shows nonspecific changes, we'll schedule patient for left cardiac catheterization. We will continue aspirin, simvastatin, benazepril, and at Toprol XL 25 mg daily.  2. Hypertension - better control than last year (hypertensive urgency with BP 200/110 mmHg), we will add toprol XL 25 mg po daily. Renal artery [place] was normal.  3. Hyperlipidemia - followed by primary care physician currently on simvastatin 20 mg  daily.  Follow up in 1 month.  Lars Masson, MD, [facility] 10/09/2014, 3:21 PM

## 2014-10-11 NOTE — Discharge Instructions (Signed)
Angiogram, Care After Refer to this sheet in the next few weeks. These instructions provide you with information on caring for yourself after your procedure. Your health care provider may also give you more specific instructions. Your treatment has been planned according to current medical practices, but problems sometimes occur. Call your health care provider if you have any problems or questions after your procedure.  WHAT TO EXPECT AFTER THE PROCEDURE After your procedure, it is typical to have the following sensations:  Minor discomfort or tenderness and a small bump at the catheter insertion site. The bump should usually decrease in size and tenderness within 1 to 2 weeks.  Any bruising will usually fade within 2 to 4 weeks. HOME CARE INSTRUCTIONS   You may need to keep taking blood thinners if they were prescribed for you. Take medicines only as directed by your health care provider.  Do not apply powder or lotion to the site.  Do not take baths, swim, or use a hot tub until your health care provider approves.  You may shower 24 hours after the procedure. Remove the bandage (dressing) and gently wash the site with plain soap and water. Gently pat the site dry.  Inspect the site at least twice daily.  Limit your activity for the first 48 hours. Do not bend, squat, or lift anything over 20 lb (9 kg) or as directed by your health care provider.  Plan to have someone take you home after the procedure. Follow instructions about when you can drive or return to work. SEEK MEDICAL CARE IF:  You get light-headed when standing up.  You have drainage (other than a small amount of blood on the dressing).  You have chills.  You have a fever.  You have redness, warmth, swelling, or pain at the insertion site. SEEK IMMEDIATE MEDICAL CARE IF:   You develop chest pain or shortness of breath, feel faint, or pass out.  You have bleeding, swelling larger than a walnut, or drainage from the  catheter insertion site.  You develop pain, discoloration, coldness, or severe bruising in the leg or arm that held the catheter.  You develop bleeding from any other place, such as the bowels. You may see bright red blood in your urine or stools, or your stools may appear black and tarry.  You have heavy bleeding from the site. If this happens, hold pressure on the site. MAKE SURE YOU:  Understand these instructions.  Will watch your condition.  Will get help right away if you are not doing well or get worse. Document Released: 02/13/2005 Document Revised: 12/12/2013 Document Reviewed: 12/20/2012 [facility] Patient Information 2015 [place], [place]. This information is not intended to replace advice given to you by your health care provider. Make sure you discuss any questions you have with your health care provider. Return To Work __________________Rebecca Pope________________________________ was treated at our facility. 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 Your employer should be aware of your condition and can help with the necessary work activity restrictions. If you wish to return to work sooner  than the date above, or if you have further problems which make it difficult for you to return at that time, please call us or your caregiver. _________________________________________ Physician Name  (Printed) _________________________________________ Physician Signature  _________________________________________ Date Document Released: 07/28/2005 Document Revised: 10/20/2011 Document Reviewed: 01/12/2007 [employer] Patient Information 2015 [employer] [place]. This information is not intended to replace advice given to you by your health care provider. Make sure you discuss any questions you have with your health care provider.

## 2014-10-11 NOTE — Interval H&P Note (Signed)
Cath Lab Visit (complete for each Cath Lab visit)  Clinical Evaluation Leading to the Procedure:   ACS: No.  Non-ACS:    Anginal Classification: CCS II  Anti-ischemic medical therapy: Maximal Therapy (2 or more classes of medications)  Non-Invasive Test Results: No non-invasive testing performed  Prior CABG: No previous CABG      History and Physical Interval Note:  10/11/2014 11:27 AM  Amanda Lyons  has presented today for surgery, with the diagnosis of cp  The various methods of treatment have been discussed with the patient and family. After consideration of risks, benefits and other options for treatment, the patient has consented to  Procedure(s): LEFT HEART CATHETERIZATION WITH CORONARY ANGIOGRAM (N/A) as a surgical intervention .  The patient's history has been reviewed, patient examined, no change in status, stable for surgery.  I have reviewed the patient's chart and labs.  Questions were answered to the patient's satisfaction.     Lesleigh NoeSMITH III,HENRY W

## 2014-10-11 NOTE — CV Procedure (Signed)
     Left Heart Catheterization with Coronary Angiography Report  Amanda RocherRebecca B Lyons  59 y.o.  female [birth year]  Procedure Date: 10/11/2014 Referring Physician: Aris LotKatarina, MD Primary Cardiologist: Aris LotKatarina, MD  INDICATIONS: Chest pain concerning for angina. No myocardial perfusion of the stress test.  PROCEDURE: 1. Left heart catheterization; 2. Coronary angiography; 3. Left ventriculography  CONSENT:  The risks, benefits, and details of the procedure were explained in detail to the patient. Risks including death, stroke, heart attack, kidney injury, allergy, limb ischemia, bleeding and radiation injury were discussed.  The patient verbalized understanding and wanted to proceed.  Informed written consent was obtained.  PROCEDURE TECHNIQUE:  After Xylocaine anesthesia a 5 French sheath was placed in the right femoral artery using the modified Seldinger technique.  Coronary angiography was done using a 5 F 5 French A2 MP and JR4 catheter.  Left ventriculography was done using the A2 MP catheter and hand injection.   Images were reviewed and the case was terminated.  Perclose was used for hemostasis without complications.   CONTRAST:  Total of 60 cc.  COMPLICATIONS:  None   HEMODYNAMICS:  Aortic pressure 136/75 mmHg; LV pressure 132/4 mmHg; LVEDP 9 mmHg  ANGIOGRAPHIC DATA:   The left main coronary artery is normal.  The left anterior descending artery stops short of the left ventricular apex. The vessel is normal.  The left circumflex artery is normal.  The right coronary artery is normal.  LEFT VENTRICULOGRAM:  Left ventricular angiogram was done in the 30 RAO projection and revealed normal cavity size and normal systolic function. EF 60%   IMPRESSIONS:  1. Normal left heart catheterization with normal coronary arteries. 2. Normal left ventricular systolic and diastolic function. EF 60%   RECOMMENDATION:  Discharge later today.

## 2014-10-11 NOTE — Research (Signed)
BioFlow Informed Consent   Subject Name: Amanda Lyons  Subject met inclusion and exclusion criteria.  The informed consent form, study requirements and expectations were reviewed with the subject and questions and concerns were addressed prior to the signing of the consent form.  The subject verbalized understanding of the trail requirements.  The subject agreed to participate in the BioFlow trial and signed the informed consent.  The informed consent was obtained prior to performance of any protocol-specific procedures for the subject.  A copy of the signed informed consent was given to the subject and a copy was placed in the subject's medical record.  Hugh Pruitt Jr. 10/11/2014, 1040 am  

## 2014-10-12 ENCOUNTER — Telehealth: Payer: Self-pay | Admitting: Cardiology

## 2014-10-12 NOTE — Telephone Encounter (Signed)
Informed the pt that her lab results per Dr Delton SeeNelson were completely normal.  Pt wanted to ask Dr Delton SeeNelson if its still necessary that she have a 48 hour holter monitor, being her cath she had done this week was completely normal.  Informed the pt that Dr Delton SeeNelson is out of the office the rest of the week, but I will route this message to her for further review if holter monitor is still desired or not, and follow-up with the pt thereafter.  Pt verbalized understanding and agrees with this plan.

## 2014-10-12 NOTE — Telephone Encounter (Signed)
Had cath on 10/11/14 - need to know results of labs and also do I still need to do the monitor.

## 2014-10-13 NOTE — Telephone Encounter (Signed)
Spoke with the pt to inform her that per Dr Delton SeeNelson she still wants her to proceed with getting the holter monitor put on, to assess if an arrhythmia is present or not.  Pt verbalized understanding and agrees with this plan, stating she will call into the office next week to schedule an appt to have this put on.  Order is already in epic.

## 2014-10-13 NOTE — Telephone Encounter (Signed)
Left message for the pt to call back to inform that per Dr Delton SeeNelson she still wants the pt to come in for holter monitor, for the cath ruled out CAD, and the holter will give more info if the pt is in an arrhythmia or not.

## 2014-10-13 NOTE — Telephone Encounter (Signed)
Yes, I still want that Holter monitor, with the cath we ruled out CAD, Holter would give us additional info about possible arrhythmias.

## 2014-10-18 ENCOUNTER — Encounter: Payer: Self-pay | Admitting: *Deleted

## 2014-10-18 ENCOUNTER — Encounter (INDEPENDENT_AMBULATORY_CARE_PROVIDER_SITE_OTHER): Payer: No Typology Code available for payment source

## 2014-10-18 DIAGNOSIS — I10 Essential (primary) hypertension: Secondary | ICD-10-CM

## 2014-10-18 DIAGNOSIS — R079 Chest pain, unspecified: Secondary | ICD-10-CM

## 2014-10-18 DIAGNOSIS — R002 Palpitations: Secondary | ICD-10-CM

## 2014-10-18 NOTE — Progress Notes (Signed)
Patient ID: Amanda RocherRebecca B Lyons, female   DOB: [birth year], 57 y.o.   MRN: [id number] [facility] 48 hour holter monitor applied to patient.

## 2014-10-23 ENCOUNTER — Telehealth: Payer: Self-pay | Admitting: Cardiology

## 2014-10-23 DIAGNOSIS — I729 Aneurysm of unspecified site: Secondary | ICD-10-CM

## 2014-10-23 NOTE — Telephone Encounter (Signed)
Pt calling to inform Dr Delton SeeNelson that since her cath, her right groin has been real sore at the site the cardiac cath was accessed.  Pt reports that this past Saturday she noted a knot the size of a 50 cent piece at her right groin area, where cath was placed.  Pt reports that she asked her neighbor who, who works in a cardiac cath lab [facility] if this was anything to be concerned about. Pt reports her neighbor told her that this is common about 3-4 days post cath, but pt states its been 10 days since her cath.  Pt states her neighbor advised her to place warm compresses on the site and rest her leg for the rest of the weekend.  Pt reports she did exactly as her neighbor advised, and the knot went down to a quarter size, but is still very sore.  Pt reports she is running no fever, the site is not red or warm to touch. Pt reports there is no drainage or infection noted at the site.  Pt reports she has no cp or sob at this time, or ever.  Pt reports she has good circulation to her right leg, with no discoloration noted.  Pt states she works at the [employer], 2nd shift, and she is on her feet for 12 hours at a time.  Pt states its still very sore, and she has to report to work at 1:30 pm, and concerned this might make the site worse, or she won't be able to tolerate the standing for a long period of time.  Pt would like for Dr Delton SeeNelson to advise if its safe to go to work, and if its normal to still be experiencing pain/knot at her right groin area.  Informed the pt that I will route this to Dr Delton SeeNelson for further review and recommendation of this and follow-up thereafter.  Pt verbalized understanding and agrees with this plan.

## 2014-10-23 NOTE — Telephone Encounter (Signed)
Informed the pt that per Dr Delton SeeNelson she recommends that she have an arterial LE US to rule out pseudoaneurysm tomorrow in our office.  Informed the pt that per Dr Delton SeeNelson we will schedule a follow-up appt based on her arterial LE US results. Informed the pt that per Dr Delton SeeNelson she should report to the ER now if she is experiencing any numbness, and tingling in her right leg.  Per the pt she states she is not experiencing any tingling or numbness at this time, or ever, its just really sore.  Pt verbalized understanding of plan and agrees with this.

## 2014-10-23 NOTE — Telephone Encounter (Signed)
New message    Patient calling C/o was groin site size of 50 cent piece . Today size of quarter . Sore .

## 2014-10-23 NOTE — Telephone Encounter (Signed)
She needs to have an arterial LE US to rule out pseudoaneurysm tomorrow in our office and I will see her in the afternoon. If she feels any numbness, tingling in her leg right now she should go to the ER.

## 2014-10-24 ENCOUNTER — Ambulatory Visit (HOSPITAL_COMMUNITY): Payer: No Typology Code available for payment source | Attending: Cardiovascular Disease | Admitting: *Deleted

## 2014-10-24 ENCOUNTER — Telehealth: Payer: Self-pay | Admitting: Cardiology

## 2014-10-24 DIAGNOSIS — I729 Aneurysm of unspecified site: Secondary | ICD-10-CM | POA: Diagnosis not present

## 2014-10-24 NOTE — Telephone Encounter (Signed)
Walk in pt Form " [facility]

## 2014-10-24 NOTE — Progress Notes (Signed)
Right lower extremity arterial Duplex - Limited - negative for Pseudoaneurysm

## 2014-10-25 ENCOUNTER — Telehealth: Payer: Self-pay | Admitting: Cardiology

## 2014-10-25 NOTE — Telephone Encounter (Signed)
Pt calling to in regards to her lower extremity duplex done 10/24/14 to rule out pseudoaneurysm.  Informed the pt that per Dr Jari SportsmanArida's interpretation of this study, her results showed superficial mass possibly lymph node or a very small hematoma.  Advised the pt if this is still sore she should take tylenol as needed for the pain. Advised the pt to rotate ice pack and heat pack every 20 mins for soreness and swelling.  Pt verbalized understanding and agrees with this plan.

## 2014-10-25 NOTE — Telephone Encounter (Signed)
New message    Patient calling for test results & questions

## 2014-10-26 ENCOUNTER — Telehealth: Payer: Self-pay | Admitting: *Deleted

## 2014-10-26 NOTE — Telephone Encounter (Signed)
Left message to call back to inform the pt that per Dr Delton SeeNelson her 48 hour holter monitor revealed that the pt has infrequent PVCs, but otherwise normal holter monitor.

## 2014-10-26 NOTE — Telephone Encounter (Signed)
Pt informed of 48 hour holter monitor results showing infrequent PVCs, but otherwise normal holter per Dr Delton SeeNelson. Pt verbalized understanding.

## 2014-11-02 ENCOUNTER — Ambulatory Visit: Payer: Self-pay | Admitting: Family Medicine

## 2014-11-15 ENCOUNTER — Encounter: Payer: Self-pay | Admitting: Cardiology

## 2014-11-15 ENCOUNTER — Ambulatory Visit (INDEPENDENT_AMBULATORY_CARE_PROVIDER_SITE_OTHER): Payer: No Typology Code available for payment source | Admitting: Cardiology

## 2014-11-15 VITALS — BP 138/78 | HR 68 | Ht 62.0 in | Wt 170.0 lb

## 2014-11-15 DIAGNOSIS — I493 Ventricular premature depolarization: Secondary | ICD-10-CM

## 2014-11-15 DIAGNOSIS — I10 Essential (primary) hypertension: Secondary | ICD-10-CM | POA: Diagnosis not present

## 2014-11-15 DIAGNOSIS — R002 Palpitations: Secondary | ICD-10-CM

## 2014-11-15 DIAGNOSIS — R0600 Dyspnea, unspecified: Secondary | ICD-10-CM

## 2014-11-15 DIAGNOSIS — R0609 Other forms of dyspnea: Secondary | ICD-10-CM

## 2014-11-15 NOTE — Patient Instructions (Signed)
Your physician recommends that you continue on your current medications as directed. Please refer to the Current Medication list given to you today.   Your physician wants you to follow-up in: ONE YEAR WITH DR NELSON You will receive a reminder letter in the mail two months in advance. If you don't receive a letter, please call our office to schedule the follow-up appointment.  

## 2014-11-15 NOTE — Progress Notes (Signed)
Patient ID: HENDEL GATLIFF, female   DOB: 1956/07/22, 59 y.o.   MRN: [id number] Patient ID: NORALEE DUTKO, female   DOB: [birth year], 59 y.o.   MRN: [id number]    [name] Date of Encounter: 11/15/2014  Primary Care Provider:  Vonita Moss, MD Primary Cardiologist:  Lars Masson  Problem List   Past Medical History  Diagnosis Date  . Hypertension   . GERD (gastroesophageal reflux disease)   . History of anxiety     had anxiety attacks  . History of kidney stones   . Trigeminal neuralgia   . HNP (herniated nucleus pulposus), lumbar 05/02/2013  . Dyslipidemia 11/08/2013  . Chest pain 11/08/2013   Past Surgical History  Procedure Laterality Date  . Abdominal hysterectomy    . Gamma knife  1999    for trigeminal neuralgia  . Brain surgery      for trigeminal neuralgia  . Lumbar laminectomy/decompression microdiscectomy  04/19/2003    Right L5-S1 intralaminar laminotomy for excision of herniated disk with the operating microscope  . Lumbar laminectomy/decompression microdiscectomy Right 05/02/2013    Procedure: Right Lumbar Five Sacral One Microdiskectomy;  Surgeon: Temple Pacini, MD;  Location: [facility];  Service: Neurosurgery;  Laterality: Right;  LUMBAR LAMINECTOMY/DECOMPRESSION MICRODISCECTOMY 1 LEVEL  . Left heart catheterization with coronary angiogram N/A 10/11/2014    Procedure: LEFT HEART CATHETERIZATION WITH CORONARY ANGIOGRAM;  Surgeon: Lesleigh Noe, MD;  Location: [facility] CATH LAB;  Service: Cardiovascular;  Laterality: N/A;   Allergies  Allergies  Allergen Reactions  . Prednisone Nausea And Vomiting  . Sulfa Antibiotics Nausea And Vomiting    HPI  Amanda Lyons is a 59 y.o. female, hypertension, dyslipidemia, GERD, chronic back pain status post spine surgery, who has been noticing high blood pressures at home for the last few days, also is noticing some exertional shortness of breath for the last few days at work was today driving and noticed  substernal chest heaviness along with some palpitations and shortness of breath, this lasted for about 2 hours, chest heaviness was nonradiating, aggravated by exertion relieved by rest, she called EMS who arrived and gave her nitroglycerin with chest pain relief, then came to the ER where she was noticed to have high blood pressure, unremarkable EKG, chest x-ray and blood work and I was called to admit the patient.  The patient had similar presentation approximately 10 years ago when she was diagnosed with hypertension and underwent a stress test at [facility]. Stress test was negative and her symptoms resolved after her blood pressure was controlled.  After EMS was called her blood pressure was 201/110 and she gets some relief with sublingual nitroglycerin that brought her blood pressure to 190.  Her stress echo at the hospital was negative with hypertensive PB response.  12/20/2013 - The patient is coming after 1 month, her chest pain and heaviness has resolved, but she still feels SOB on moderate exertion and feels like lacking energy on some days. She has started to walk but not on a regular basis yet.   10/09/2014 - the patient is coming after 8 months, she complains of progressively worsening exertional chest pain and , now with minimal exertion that prevents her to perform activities of daily living. At the presentation last year her chest pain was believed to be secondary to hypertensive urgency however her blood pressure is now much better controlled and her symptoms are worsening. She has been compliant to her meds. She  has also been experiencing significant palpitations, last Friday lasting 5-6 hours. She experiences these almost every day.  11/15/2014 - 1 month follow up, she underwent cardiac cath that was normal. She developed groin pain and arterial US was negative was pseudoaneurysm or fistula, this has resolved, she feels better, improved DOE, no chest pain, she is very active at work - works as  a Engineer, petroleum at [employer]. Palpitations improved with metoprolol, only 3 since starting, few seconds lasting.    Home Medications  Prior to Admission medications   Medication Sig Start Date End Date Taking? Authorizing Provider  aspirin EC 81 MG EC tablet Take 1 tablet (81 mg total) by mouth daily. 11/09/13  Yes Leroy Sea, MD  benazepril-hydrochlorthiazide (LOTENSIN HCT) 20-12.5 MG per tablet Take 1 tablet by mouth daily.   Yes Historical Provider, MD  carvedilol (COREG) 6.25 MG tablet Take 1 tablet (6.25 mg total) by mouth 2 (two) times daily with a meal. 11/09/13  Yes Leroy Sea, MD  nitroGLYCERIN (NITROSTAT) 0.4 MG SL tablet Place 1 tablet (0.4 mg total) under the tongue every 5 (five) minutes as needed for chest pain (CP or SOB). 11/09/13  Yes Leroy Sea, MD  omeprazole (PRILOSEC) 20 MG capsule Take 20 mg by mouth daily.   Yes Historical Provider, MD  oxyCODONE-acetaminophen (PERCOCET/ROXICET) 5-325 MG per tablet Take 0.5 tablets by mouth daily as needed for moderate pain or severe pain (back pain from recent surgery).   Yes Historical Provider, MD  simvastatin (ZOCOR) 20 MG tablet Take 20 mg by mouth at bedtime.   Yes Historical Provider, MD    Family History  Family History  Problem Relation Age of Onset  . CVA Father   . Heart failure Father   . Ovarian cancer Mother   . Heart failure Maternal Grandmother   . Heart attack Maternal Grandfather     Social History  History   Social History  . Marital Status: Married    Spouse Name: N/A  . Number of Children: N/A  . Years of Education: N/A   Occupational History  . Not on file.   Social History Main Topics  . Smoking status: Never Smoker   . Smokeless tobacco: Not on file  . Alcohol Use: No  . Drug Use: No  . Sexual Activity: Not on file   Other Topics Concern  . Not on file   Social History Narrative     Review of Systems, as per HPI, otherwise negative General:  No chills, fever, night sweats or  weight changes.  Cardiovascular:  No chest pain, dyspnea on exertion, edema, orthopnea, palpitations, paroxysmal nocturnal dyspnea. Dermatological: No rash, lesions/masses Respiratory: No cough, dyspnea Urologic: No hematuria, dysuria Abdominal:   No nausea, vomiting, diarrhea, bright red blood per rectum, melena, or hematemesis Neurologic:  No visual changes, wkns, changes in mental status. All other systems reviewed and are otherwise negative except as noted above.  Physical Exam  Blood pressure 138/78, pulse 68, height  (1.575 m), weight 170 lb (77.111 kg).  General: Pleasant, NAD Psych: Normal affect. Neuro: Alert and oriented X 3. Moves all extremities spontaneously. HEENT: Normal  Neck: Supple without bruits or JVD. Lungs:  Resp regular and unlabored, CTA. Heart: RRR no s3, s4, or murmurs. Abdomen: Soft, non-tender, non-distended, BS + x 4.  Extremities: No clubbing, cyanosis or edema. DP/PT/Radials 2+ and equal bilaterally.  Labs:  No results for input(s): CKTOTAL, CKMB, TROPONINI in the last 72 hours. Lab Results  Component Value Date   WBC 8.0 10/09/2014   HGB 13.5 10/09/2014   HCT 40.0 10/09/2014   MCV 86.1 10/09/2014   PLT 352.0 10/09/2014        Component Value Date/Time   NA 138 10/09/2014 1621   K 3.8 10/09/2014 1621   CL 103 10/09/2014 1621   CO2 28 10/09/2014 1621   GLUCOSE 135* 10/09/2014 1621   BUN 17 10/09/2014 1621   CREATININE 0.75 10/09/2014 1621   CALCIUM 10.3 10/09/2014 1621   PROT 7.4 10/09/2014 1621   ALBUMIN 4.2 10/09/2014 1621   AST 18 10/09/2014 1621   ALT 29 10/09/2014 1621   ALKPHOS 130* 10/09/2014 1621   BILITOT 0.3 10/09/2014 1621   GFRNONAA >90 11/08/2013 1638   GFRAA >90 11/08/2013 1638   No results found for: CHOL  Accessory Clinical Findings  Echocardiogram - stress 11/09/2013  - Stress ECG conclusions: There were no stress arrhythmias or conduction abnormalities. The stress ECG was negative for ischemia. - Staged  echo: Normal echo stress Impressions: - Normal study after maximal exercise. Hypertensive stress response.  ECG: NSR, anterior infarct, age undetermined, negative T waves in the anterolateral leads.   Left cardiac cath: 10/11/2014 ANGIOGRAPHIC DATA: The left main coronary artery is normal. The left anterior descending artery stops short of the left ventricular apex. The vessel is normal. The left circumflex artery is normal. The right coronary artery is normal. LEFT VENTRICULOGRAM: Left ventricular angiogram was done in the 30 RAO projection and revealed normal cavity size and normal systolic function. EF 60% IMPRESSIONS: 1. Normal left heart catheterization with normal coronary arteries. 2. Normal left ventricular systolic and diastolic function. EF 60%    Assessment & Plan  59 year old female with prior medical history of hypertensive urgency, hyperlipidemia   1. Exertional chest pain and shortness of breath - normal cath, she is encourage to increase walking  2. Palpitations - frequent PVCs - 450 in 24 hour Holter  3. Hypertension - controlled after adding Toprol XL 25 mg po daily. Renal artery US was normal.  4. Hyperlipidemia - followed by primary care physician currently on atorvastatin 10 mg daily.  Follow up in 1 year.  Lars MassonNELSON, Vernard Gram H, MD, [facility] 11/15/2014, 11:11 AM

## 2014-11-17 ENCOUNTER — Telehealth: Payer: Self-pay | Admitting: Cardiology

## 2014-11-17 NOTE — Telephone Encounter (Signed)
Pt was instructed to call our office regarding rash by her PCP as this is the only thing they think it could be is her Metoprolol.  Pt was started on Metoprolol on 2/29 and this is the first signs of a reaction. Pt stated she has not tried Benadryl but she has used Hydrocortisone and it has not helped.  Also, recommend that pt contact dermatologist is it persists to be evaluated. Pt agreed and not additional questions at this time.

## 2014-11-17 NOTE — Telephone Encounter (Signed)
Pt c/o medication issue:  1. Name of Medication: Metoprolol 25mg    2. How are you currently taking this medication (dosage and times per day)? 1 tablet a day  3. Are you having a reaction (difficulty breathing--STAT)? No  4. What is your medication issue? Breaking out in a rash on stomach, under each breast and both legs that itch really bad.

## 2014-12-27 ENCOUNTER — Other Ambulatory Visit: Payer: Self-pay

## 2014-12-27 MED ORDER — AMLODIPINE BESYLATE 5 MG PO TABS: 5.0000 mg | ORAL_TABLET | Freq: Every day | ORAL | Status: DC

## 2015-01-23 ENCOUNTER — Ambulatory Visit (INDEPENDENT_AMBULATORY_CARE_PROVIDER_SITE_OTHER): Payer: No Typology Code available for payment source | Admitting: Family Medicine

## 2015-01-23 ENCOUNTER — Encounter (INDEPENDENT_AMBULATORY_CARE_PROVIDER_SITE_OTHER): Payer: Self-pay

## 2015-01-23 ENCOUNTER — Encounter: Payer: Self-pay | Admitting: Family Medicine

## 2015-01-23 VITALS — BP 150/80 | HR 71 | Temp 97.7°F | Wt 167.0 lb

## 2015-01-23 DIAGNOSIS — I10 Essential (primary) hypertension: Secondary | ICD-10-CM

## 2015-01-23 DIAGNOSIS — E785 Hyperlipidemia, unspecified: Secondary | ICD-10-CM | POA: Diagnosis not present

## 2015-01-23 DIAGNOSIS — M654 Radial styloid tenosynovitis [de Quervain]: Secondary | ICD-10-CM

## 2015-01-23 MED ORDER — BENAZEPRIL-HYDROCHLOROTHIAZIDE 20-12.5 MG PO TABS: 1.5000 | ORAL_TABLET | Freq: Every day | ORAL | Status: DC

## 2015-01-23 NOTE — Assessment & Plan Note (Signed)
Avoid saturated fats, return in 2 weeks for fasting labs and visit

## 2015-01-23 NOTE — Progress Notes (Signed)
   BP 155/87 mmHg  Pulse 69  Temp(Src) 97.7 F (36.5 C)  Wt 167 lb (75.751 kg)  SpO2 99%   Subjective:    Patient ID: Amanda Lyons, female    DOB: Dec 17, 1955, 59 y.o.   MRN: [id number]  HPI: Amanda Lyons is a 59 y.o. female  Chief Complaint  Patient presents with  . Hand Pain   The left hand hurts When she bends her thumb, it hurts She does a lot of lifting at work; does sweeping and repetitive stuff She is wearing a brace on the wrist and keeps the thumb from moving She screamed yesterday at work after turning it wrong with the brace on She has not used any ice on it, but has used [employer] on it at night She has tried NSAIDs, Aleve just one a time just sporadically, takes half of a percocet at times (from Dr. Jordan Likes from back surgery) Bending and opposing the thumb make the symptoms worse  Her BP is up today; she has not taken her daily pills yet, but did take them yesterday; she takes them around lunch apparently; she saw her heart doctor not long ago; her heart doctor prescribes the CCB and beta-blocker; we prescribe her ACE-I thiazide combo  She has high cholesterol, but has not had a visit for that or labs in almost a year  Relevant past medical, surgical, family and social history reviewed and updated as indicated. Interim medical history since our last visit reviewed. Allergies and medications reviewed and updated.  Review of Systems  Constitutional: Negative for fever.  Cardiovascular: Negative for chest pain and leg swelling.  Gastrointestinal: Negative for blood in stool.  Endocrine: Negative for polydipsia and polyuria.  Musculoskeletal: Positive for arthralgias (just the left hand, thumb, wrist). Negative for joint swelling.  Skin: Negative for color change and rash.  Allergic/Immunologic: Negative for environmental allergies and food allergies.  Neurological: Negative for tremors, weakness and numbness.  Psychiatric/Behavioral: Negative for confusion.   Per  HPI unless specifically indicated above     Objective:    BP 155/87 mmHg  Pulse 69  Temp(Src) 97.7 F (36.5 C)  Wt 167 lb (75.751 kg)  SpO2 99%  Wt Readings from Last 3 Encounters:  01/23/15 167 lb (75.751 kg)  11/15/14 170 lb (77.111 kg)  10/11/14 174 lb (78.926 kg)   recheck 150/80  Physical Exam  Constitutional: She appears well-developed and well-nourished. No distress.  HENT:  Head: Normocephalic and atraumatic.  Eyes: EOM are normal. No scleral icterus.  Neck: No thyromegaly present.  Cardiovascular: Normal rate, regular rhythm and normal heart sounds.   No murmur heard. Pulmonary/Chest: Effort normal and breath sounds normal. No respiratory distress. She has no wheezes.  Abdominal: Soft. Bowel sounds are normal. She exhibits no distension.  Musculoskeletal: Normal range of motion. She exhibits tenderness (over the left abductor pollicus longus tendon; positive Finklestein's test on the LEFT). She exhibits no edema.  Minimal edema over the radial left wrist; no erythema  Neurological: She is alert. She exhibits normal muscle tone.  Skin: Skin is warm and dry. She is not diaphoretic. No pallor.  Psychiatric: She has a normal mood and affect. Her behavior is normal. Judgment and thought content normal.      Assessment & Plan:   Problem List Items Addressed This Visit    None       Follow up plan: No Follow-up on file.

## 2015-01-23 NOTE — Assessment & Plan Note (Signed)
Really watch diet, limit salt, be aware of NSAID increasing BP; increase BP medicine; return for visit and labs to recheck Cr and K+ in 2 weeks

## 2015-01-23 NOTE — Patient Instructions (Addendum)
Use ice topically, right over the sore place; place a thin towel or t-shirt in between the ice and your skin Apply the ice for about 10 to 15 minutes at a time, every 2 to 3 hours Get some Aleve 220 mg and take TWO at a time TWICE a day for one week Increase the benazepril / hctz to one and one-half pills by mouth daily Your goal blood pressure is less than 140 on top Try to follow the DASH guidelines (DASH stands for Dietary Approaches to Stop Hypertension) Try to limit the sodium in your diet.  Ideally, consume less than 1.5 grams (less than 1,500mg ) per day. Do not add salt when cooking or at the table.  Check the sodium amount on labels when shopping, and choose items lower in sodium when given a choice. Avoid or limit foods that already contain a lot of sodium. Eat a diet rich in fruits and vegetables and whole grains. Avoid salt substitutes and avoid fruit smoothies Return for fasting labs and visit in 2 weeks I've referred you to the orthopaedist, so please call us if you have not heard anything in 1 week  De Quervain's Tenosynovitis De Quervain's tenosynovitis involves inflammation of one or two tendon linings (sheaths) or strain of one or two tendons to the thumb: extensor pollicis brevis (EPB), or abductor pollicis longus (APL). This causes pain on the side of the wrist and base of the thumb. Tendon sheaths secrete a fluid that lubricates the tendon, allowing the tendon to move smoothly. When the sheath becomes inflamed, the tendon cannot move freely in the sheath. Both the EPB and APL tendons are important for proper use of the hand. The EPB tendon is important for straightening the thumb. The APL tendon is important for moving the thumb away from the index finger (abducting). The two tendons pass through a small tube (canal) in the wrist, near the base of the thumb. When the tendons become inflamed, pain is usually felt in this area. SYMPTOMS   Pain, tenderness, swelling, warmth, or  redness over the base of the thumb and thumb side of the wrist.  Pain that gets worse when straightening the thumb.  Pain that gets worse when moving the thumb away from the index finger, against resistance.  Pain with pinching or gripping.  Locking or catching of the thumb.  Limited motion of the thumb.  Crackling sound (crepitation) when the tendon or thumb is moved or touched.  Fluid-filled cyst in the area of the base of the thumb. CAUSES   Tenosynovitis is often linked with overuse of the wrist.  Tenosynovitis may be caused by repeated injury to the thumb muscle and tendon units, and with repeated motions of the hand and wrist, due to friction of the tendon within the lining (sheath).  Tenosynovitis may also be due to a sudden increase in activity or change in activity. RISK INCREASES WITH:  Sports that involve repeated hand and wrist motions (golf, bowling, tennis, squash, racquetball).  Heavy labor.  Poor physical wrist strength and flexibility.  Failure to warm up properly before practice or play.  Female gender.  New mothers who hold their baby's head for long periods or lift infants with thumbs in the infant's armpit (axilla). PREVENTION  Warm up and stretch properly before practice or competition.  Allow enough time for rest and recovery between practices and competition.  Maintain appropriate conditioning:  Cardiovascular fitness.  Forearm, wrist, and hand flexibility.  Muscle strength and endurance.  Use  proper exercise technique. PROGNOSIS  This condition is usually curable within 6 weeks, if treated properly with non-surgical treatment and resting of the affected area.  RELATED COMPLICATIONS   Longer healing time if not properly treated or if not given enough time to heal.  Chronic inflammation, causing recurring symptoms of tenosynovitis. Permanent pain or restriction of movement.  Risks of surgery: infection, bleeding, injury to nerves  (numbness of the thumb), continued pain, incomplete release of the tendon sheath, recurring symptoms, cutting of the tendons, tendons sliding out of position, weakness of the thumb, thumb stiffness. TREATMENT  First, treatment involves the use of medicine and ice, to reduce pain and inflammation. Patients are encouraged to stop or modify activities that aggravate the injury. Stretching and strengthening exercises may be advised. Exercises may be completed at home or with a therapist. You may be fitted with a brace or splint, to limit motion and allow the injury to heal. Your caregiver may also choose to give you a corticosteroid injection, to reduce the pain and inflammation. If non-surgical treatment is not successful, surgery may be needed. Most tenosynovitis surgeries are done as outpatient procedures (you go home the same day). Surgery may involve local, regional (whole arm), or general anesthesia.  MEDICATION   If pain medicine is needed, nonsteroidal anti-inflammatory medicines (aspirin and ibuprofen), or other minor pain relievers (acetaminophen), are often advised.  Do not take pain medicine for 7 days before surgery.  Prescription pain relievers are often prescribed only after surgery. Use only as directed and only as much as you need.  Corticosteroid injections may be given if your caregiver thinks they are needed. There is a limited number of times these injections may be given. COLD THERAPY   Cold treatment (icing) should be applied for 10 to 15 minutes every 2 to 3 hours for inflammation and pain, and immediately after activity that aggravates your symptoms. Use ice packs or an ice massage. SEEK MEDICAL CARE IF:   Symptoms get worse or do not improve in 2 to 4 weeks, despite treatment.  You experience pain, numbness, or coldness in the hand.  Blue, gray, or dark color appears in the fingernails.  Any of the following occur after surgery: increased pain, swelling, redness, drainage  of fluids, bleeding in the affected area, or signs of infection.  New, unexplained symptoms develop. (Drugs used in treatment may produce side effects.) Document Released: 07/28/2005 Document Revised: 10/20/2011 Document Reviewed: 11/09/2008 [facility] Patient Information 2015 [place], [place]. This information is not intended to replace advice given to you by your health care provider. Make sure you discuss any questions you have with your health care provider.

## 2015-02-06 ENCOUNTER — Ambulatory Visit (INDEPENDENT_AMBULATORY_CARE_PROVIDER_SITE_OTHER): Payer: No Typology Code available for payment source | Admitting: Family Medicine

## 2015-02-06 ENCOUNTER — Encounter: Payer: Self-pay | Admitting: Family Medicine

## 2015-02-06 VITALS — BP 122/75 | HR 63 | Temp 97.5°F | Wt 167.0 lb

## 2015-02-06 DIAGNOSIS — R748 Abnormal levels of other serum enzymes: Secondary | ICD-10-CM

## 2015-02-06 DIAGNOSIS — E785 Hyperlipidemia, unspecified: Secondary | ICD-10-CM

## 2015-02-06 DIAGNOSIS — I10 Essential (primary) hypertension: Secondary | ICD-10-CM

## 2015-02-06 DIAGNOSIS — K219 Gastro-esophageal reflux disease without esophagitis: Secondary | ICD-10-CM | POA: Diagnosis not present

## 2015-02-06 DIAGNOSIS — E669 Obesity, unspecified: Secondary | ICD-10-CM | POA: Diagnosis not present

## 2015-02-06 DIAGNOSIS — R739 Hyperglycemia, unspecified: Secondary | ICD-10-CM

## 2015-02-06 DIAGNOSIS — E663 Overweight: Secondary | ICD-10-CM | POA: Insufficient documentation

## 2015-02-06 NOTE — Assessment & Plan Note (Signed)
Check lipids today and sgpt; limit saturated fats; stay active (recent neg cardiac cath)

## 2015-02-06 NOTE — Assessment & Plan Note (Signed)
She has no hx of ulcer; try to limit PPIs, cautions given; avoid triggers

## 2015-02-06 NOTE — Assessment & Plan Note (Signed)
Encouraged weight loss; aim for BMI under 25, info given in AVS

## 2015-02-06 NOTE — Progress Notes (Signed)
BP 122/75 mmHg  Pulse 63  Temp(Src) 97.5 F (36.4 C)  Wt 167 lb (75.751 kg)  SpO2 98%   Subjective:    Patient ID: Amanda Lyons, female    DOB: 1956/06/13, 59 y.o.   MRN: [id number]  HPI: [name] is a 59 y.o. female  Chief Complaint  Patient presents with  . Hyperlipidemia  . Hypertension   Hypertension Patient has had hypertension since 4-5 years Checking blood pressure away from here?  yes How often? Occasional, not checked since last visit here Hypertension-associated complications:  none If taking medicines, are you taking them regularly?  yes Siblings / family history: Does high blood pressure run in your family?   YES Salt:  Trying to limit sodium / salt when buying foods at the grocery store?  yes ; avoids frozen dinners Do you try to limit added salt when cooking and at the table?  NO does use Sweets/licorice:  Do you eat a lot of sweets or eat black licorice?  no Saturated fats: Do you eat a lot of foods like bacon, sausage, pepperoni, cheeseburgers, hot dogs, bologna, and cheese?  YES some but not a lot Sedentary lifestyle:  Exercise/activity level:  very active (regular exercise five or more days per week) Steroids/Non-steroidals:  Have you had a recent cortisone shot in the last few months?  YES on Monday in the hand Do you take prednisone or prescription NSAIDs, no, or take OTCS NSAIDs such as ibuprofen, Motrin, Advil, Aleve, or naproxen? no Smoking: Do you smoke?  no Snoring / sleep apnea: Do you snore or have sleep apnea?  YES  Snores if really tired, but no sleep apnea Stress: Do you feel like you are under excessive stress or that your stress level affects your blood pressure at times?    YES Stroh's (alcohol): Do you drink alcohol  no Sudafed (decongestants): Do you use any OTC decongestant products like Allegra-D, Claritin-D, Zyrtec-D, Tylenol Cold and Sinus, etc.?  no  High cholesterol Patient has had high cholesterol since 4 or 5 years Dietary  intake: Do you eat more than 3 eggs per week  no Do you try to limit saturated fats in your diet?  yes Exercise: Exercise/activity level:  very active (regular exercise five or more days per week) Does high cholesterol run in your family?   YES Weight:  If overweight or obese, are you trying to lose weight?  yes   GERD Acid reflux; spicy foods; taking PPI for 5-6 years  Past Medical History  Diagnosis Date  . Hypertension   . GERD (gastroesophageal reflux disease)   . History of anxiety     had anxiety attacks  . History of kidney stones   . Trigeminal neuralgia   . HNP (herniated nucleus pulposus), lumbar 05/02/2013  . Dyslipidemia 11/08/2013  . Chest pain 11/08/2013   Family History  Problem Relation Age of Onset  . CVA Father   . Heart failure Father   . Heart disease Father   . Diabetes Father   . Hypertension Father   . Ovarian cancer Mother   . Heart failure Maternal Grandmother   . Heart attack Maternal Grandfather   . Diabetes Sister   . Hypertension Sister    History   Social History  . Marital Status: Married    Spouse Name: N/A  . Number of Children: N/A  . Years of Education: N/A   Social History Main Topics  . Smoking status: Never Smoker   .  Smokeless tobacco: Never Used  . Alcohol Use: No  . Drug Use: No  . Sexual Activity: Not on file   Other Topics Concern  . None   Social History Narrative   Relevant past medical, surgical, family and social history reviewed and updated as indicated. Interim medical history since our last visit reviewed. Allergies and medications reviewed and updated.  Review of Systems  Constitutional: Negative for unexpected weight change.  HENT: Negative for nosebleeds.   Cardiovascular: Negative for chest pain.  Gastrointestinal: Negative for nausea, vomiting, abdominal pain and blood in stool.  Genitourinary: Negative for hematuria.  Musculoskeletal: Positive for back pain (still has back pain, does lifting, uses  oxycodone prescribed by Dr. Jordan LikesPool back surgeon; not doing physical therapy).  Neurological: Negative for tremors.  No flowsheet data found.  Per HPI unless specifically indicated above     Objective:    BP 122/75 mmHg  Pulse 63  Temp(Src) 97.5 F (36.4 C)  Wt 167 lb (75.751 kg)  SpO2 98%  Wt Readings from Last 3 Encounters:  02/06/15 167 lb (75.751 kg)  01/23/15 167 lb (75.751 kg)  11/15/14 170 lb (77.111 kg)    Physical Exam  Constitutional: She appears well-developed and well-nourished. No distress.  HENT:  Head: Normocephalic and atraumatic.  Eyes: EOM are normal. No scleral icterus.  Neck: No thyromegaly present.  Cardiovascular: Normal rate, regular rhythm and normal heart sounds.   Pulmonary/Chest: Effort normal and breath sounds normal. She has no wheezes.  Abdominal: She exhibits no distension. There is no tenderness.  Musculoskeletal: She exhibits no edema.  Neurological: She is alert.  Normal gait  Skin: Skin is warm and dry. She is not diaphoretic. No erythema. No pallor.  Psychiatric: She has a normal mood and affect. Her behavior is normal. Judgment and thought content normal.      Assessment & Plan:   Problem List Items Addressed This Visit      Cardiovascular and Mediastinum   Hypertension - Primary    Today's pressure shows good control; limit salt in foods that she buys and in the kitchen and at the table; check creatinine and K+ today        Digestive   GERD (gastroesophageal reflux disease)    She has no hx of ulcer; try to limit PPIs, cautions given; avoid triggers        Other   Dyslipidemia (Chronic)    Check lipids today and sgpt; limit saturated fats; stay active (recent neg cardiac cath)      Relevant Orders   Lipid Panel w/o Chol/HDL Ratio   Obesity    Encouraged weight loss; aim for BMI under 25, info given in AVS       Other Visit Diagnoses    Elevated serum alkaline phosphatase level        check level today and do  isoenzymes if still elevated    Relevant Orders    Comprehensive metabolic panel    Hyperglycemia        Relevant Orders    Hgb A1c w/o eAG       Follow up plan: Return in about 6 months (around 08/08/2015) for HTN, high cholesterol.  Medications Discontinued During This Encounter  Medication Reason  . omeprazole (PRILOSEC) 20 MG capsule Discontinued by provider   Orders Placed This Encounter  Procedures  . Comprehensive metabolic panel    Order Specific Question:  Has the patient fasted?    Answer:  Yes  . Lipid Panel  w/o Chol/HDL Ratio    Order Specific Question:  Has the patient fasted?    Answer:  Yes  . Hgb A1c w/o eAG   An After Visit Summary was printed and given to the patient.

## 2015-02-06 NOTE — Patient Instructions (Addendum)
Caution: prolonged use of proton pump inhibitors like omeprazole (Prilosec), pantoprazole (Protonix), esomeprazole (Nexium), and others like Dexilant and Aciphex may increase your risk of pneumonia, Clostridium difficile colitis, osteoporosis, anemia and other health complications Try to limit or avoid triggers like coffee, caffeinated beverages, onions, chocolate, spicy foods, peppermint, acid foods like pizza, spaghetti sauce, and orange juice Lose weight if you are overweight or obese Try elevating the head of your bed by placing a small wedge between your mattress and box springs to keep acid in the stomach at night instead of coming up into your esophagus  Keep trying to lose weight Check out the information at familydoctor.org entitled "What It Takes to Lose Weight" Try to lose between 1-2 pounds per week by taking in fewer calories and burning off more calories You can succeed by limiting portions, limiting foods dense in calories and fat, becoming more active, and drinking 8 glasses of water a day Don't skip meals, especially breakfast, as skipping meals may alter your metabolism Do not use over-the-counter weight loss pills or gimmicks that claim rapid weight loss A healthy BMI (or body mass index) is between 18.5 and 24.9 (your BMI today was 30.54) You can calculate your ideal BMI at the NIH website [employer]huhttp://www.nhlbi.nih.gov/health/educational/lose_wt/BMI/bmicalc.htm  Your goal blood pressure is less than 140/90 Try to follow the DASH guidelines (DASH stands for Dietary Approaches to Stop Hypertension) Try to limit the sodium in your diet.  Ideally, consume less than 1.5 grams (less than 1,500mg ) per day. Do not add salt when cooking or at the table.  Check the sodium amount on labels when shopping, and choose items lower in sodium when given a choice. Avoid or limit foods that already contain a lot of sodium. Eat a diet rich in fruits and vegetables and whole grains.

## 2015-02-06 NOTE — Assessment & Plan Note (Signed)
Today's pressure shows good control; limit salt in foods that she buys and in the kitchen and at the table; check creatinine and K+ today

## 2015-02-07 ENCOUNTER — Telehealth: Payer: Self-pay | Admitting: Family Medicine

## 2015-02-07 DIAGNOSIS — R7301 Impaired fasting glucose: Secondary | ICD-10-CM

## 2015-02-07 LAB — LIPID PANEL W/O CHOL/HDL RATIO
Cholesterol, Total: 168 mg/dL (ref 100–199)
HDL: 65 mg/dL (ref >39)
LDL Calculated: 84 mg/dL (ref 0–99)
Triglycerides: 93 mg/dL (ref 0–149)
VLDL Cholesterol Cal: 19 mg/dL (ref 5–40)

## 2015-02-07 LAB — COMPREHENSIVE METABOLIC PANEL
ALT: 19 IU/L (ref 0–32)
AST: 13 IU/L (ref 0–40)
Albumin/Globulin Ratio: 1.5 (ref 1.1–2.5)
Albumin: 3.8 g/dL (ref 3.5–5.5)
Alkaline Phosphatase: 124 IU/L — ABNORMAL HIGH (ref 39–117)
BUN/Creatinine Ratio: 32 — ABNORMAL HIGH (ref 9–23)
BUN: 21 mg/dL (ref 6–24)
Bilirubin Total: 0.3 mg/dL (ref 0.0–1.2)
CALCIUM: 9.8 mg/dL (ref 8.7–10.2)
CO2: 26 mmol/L (ref 18–29)
CREATININE: 0.66 mg/dL (ref 0.57–1.00)
Chloride: 99 mmol/L (ref 97–108)
GFR calc Af Amer: 113 mL/min/{1.73_m2} (ref >59)
GFR, EST NON AFRICAN AMERICAN: 98 mL/min/{1.73_m2} (ref >59)
GLOBULIN, TOTAL: 2.5 g/dL (ref 1.5–4.5)
GLUCOSE: 112 mg/dL — AB (ref 65–99)
POTASSIUM: 4.4 mmol/L (ref 3.5–5.2)
Sodium: 139 mmol/L (ref 134–144)
TOTAL PROTEIN: 6.3 g/dL (ref 6.0–8.5)

## 2015-02-07 LAB — HGB A1C W/O EAG: Hgb A1c MFr Bld: 7 % — ABNORMAL HIGH (ref 4.8–5.6)

## 2015-02-07 MED ORDER — METFORMIN HCL ER 500 MG PO TB24: 500.0000 mg | ORAL_TABLET | Freq: Every evening | ORAL | Status: DC

## 2015-02-07 NOTE — Telephone Encounter (Signed)
I talked with patient about labs A1C is 7 I do not have two readings to put her in category of diabetes yet, so she's still technically IFG Will order 2 hour GTT Refer to diabetic educator Start metformin; do NOT take in future if sick, dehydrated Return in 3 months AMY---> this is the first 2 hour GTT I've ordered in Epic; see if this is done at the hospital or here, and set patient up for that please

## 2015-02-08 NOTE — Telephone Encounter (Signed)
I spoke with Stark BrayLynda in the lab. She stated it would be best if the patient had it done at the [facility]. [facility]?

## 2015-02-08 NOTE — Telephone Encounter (Signed)
Either one is okay with me; whatever is most convenient for the patient will be fine

## 2015-02-09 ENCOUNTER — Other Ambulatory Visit: Payer: Self-pay | Admitting: Family Medicine

## 2015-02-09 DIAGNOSIS — R7301 Impaired fasting glucose: Secondary | ICD-10-CM

## 2015-02-09 NOTE — Telephone Encounter (Signed)
I spoke with patient, she would like [facility] to be [facility] at [facility]. [facility].

## 2015-02-16 ENCOUNTER — Other Ambulatory Visit
Admission: RE | Admit: 2015-02-16 | Discharge: 2015-02-16 | Disposition: A | Discharge status: Home / Self Care | Payer: No Typology Code available for payment source | Source: Ambulatory Visit | Attending: Family Medicine | Admitting: Family Medicine

## 2015-02-16 DIAGNOSIS — R7301 Impaired fasting glucose: Secondary | ICD-10-CM | POA: Diagnosis not present

## 2015-02-16 LAB — GLUCOSE, 2 HOUR: Glucose, 2 hour: 171 mg/dL — ABNORMAL HIGH (ref 70–139)

## 2015-02-16 LAB — GLUCOSE, FASTING: Glucose, Fasting: 111 mg/dL — ABNORMAL HIGH (ref 65–99)

## 2015-02-19 ENCOUNTER — Telehealth: Payer: Self-pay

## 2015-02-19 NOTE — Telephone Encounter (Signed)
The test should not cause her to feel dizzy The metformin should not cause dizziness However, I would recommend that she stop the metformin for one week and then restart Stay hydrated If dizziness persists, make an appt

## 2015-02-19 NOTE — Telephone Encounter (Signed)
Pt requests a call back from Amy as soon as possible. Thanks.

## 2015-02-19 NOTE — Telephone Encounter (Signed)
She has been dizzy since Friday. She didn't know if it could be from the metformin or she has the GGT done on Friday and wonders if it could be from that. She didn't feel safe driving to her work this afternoon due to is. She wants your thoughts on what could be causing it and/or what she should do.

## 2015-02-20 ENCOUNTER — Telehealth: Payer: Self-pay

## 2015-02-20 NOTE — Telephone Encounter (Signed)
She wants to know if she can get a work note for yesterday due to the dizziness.

## 2015-02-20 NOTE — Telephone Encounter (Signed)
Patient notified

## 2015-02-20 NOTE — Telephone Encounter (Signed)
Letter written, left message to pick it up.

## 2015-02-20 NOTE — Telephone Encounter (Signed)
That's fine

## 2015-02-27 ENCOUNTER — Telehealth: Payer: Self-pay | Admitting: Family Medicine

## 2015-02-27 NOTE — Telephone Encounter (Signed)
Patient notified that per Dr. Marlise EvesLada's message, she can restart her metformin. Advised her to stay hydrated and it the dizziness returns to call our office.

## 2015-02-27 NOTE — Telephone Encounter (Signed)
Pt wondering if she needs to restart her metformin? Please call and advise.

## 2015-04-17 ENCOUNTER — Other Ambulatory Visit: Payer: Self-pay

## 2015-04-17 MED ORDER — ATORVASTATIN CALCIUM 10 MG PO TABS: 10.0000 mg | ORAL_TABLET | Freq: Every day | ORAL | Status: DC

## 2015-04-17 NOTE — Telephone Encounter (Signed)
Lipids and sgpt June 2016 reviewed; Rx approved

## 2015-05-02 ENCOUNTER — Ambulatory Visit (INDEPENDENT_AMBULATORY_CARE_PROVIDER_SITE_OTHER): Payer: No Typology Code available for payment source | Admitting: Family Medicine

## 2015-05-02 ENCOUNTER — Encounter: Payer: Self-pay | Admitting: Family Medicine

## 2015-05-02 ENCOUNTER — Ambulatory Visit
Admission: RE | Admit: 2015-05-02 | Discharge: 2015-05-02 | Disposition: A | Discharge status: Home / Self Care | Payer: No Typology Code available for payment source | Source: Ambulatory Visit | Attending: Family Medicine | Admitting: Family Medicine

## 2015-05-02 VITALS — BP 148/78 | HR 73 | Temp 98.0°F | Ht 64.0 in | Wt 166.0 lb

## 2015-05-02 DIAGNOSIS — R1310 Dysphagia, unspecified: Secondary | ICD-10-CM

## 2015-05-02 DIAGNOSIS — R221 Localized swelling, mass and lump, neck: Secondary | ICD-10-CM

## 2015-05-02 DIAGNOSIS — R05 Cough: Secondary | ICD-10-CM | POA: Diagnosis not present

## 2015-05-02 DIAGNOSIS — R490 Dysphonia: Secondary | ICD-10-CM | POA: Insufficient documentation

## 2015-05-02 DIAGNOSIS — K219 Gastro-esophageal reflux disease without esophagitis: Secondary | ICD-10-CM

## 2015-05-02 DIAGNOSIS — R059 Cough, unspecified: Secondary | ICD-10-CM

## 2015-05-02 NOTE — Assessment & Plan Note (Signed)
Refer to ENT

## 2015-05-02 NOTE — Assessment & Plan Note (Signed)
[place] of neck and refer to ENT

## 2015-05-02 NOTE — Progress Notes (Signed)
BP 148/78 mmHg  Pulse 73  Temp(Src) 98 F (36.7 C)  Ht  (1.626 m)  Wt 166 lb (75.297 kg)  BMI 28.48 kg/m2  SpO2 97%   Subjective:    Patient ID: Amanda Lyons, female    DOB: 05/19/1956, 59 y.o.   MRN: [id number]  HPI: Amanda Lyons is a 59 y.o. female  Chief Complaint  Patient presents with  . Knot on Neck    Patient noticed it about 3 weeks ago. Now feels like it is getting bigger and she feels like she is getting "hoarse"   She has felt a knot on her throat; it is growing; just noticed it 3 weeks ago; has been on vacation for 2 weeks, so just now doing something about it It feels like it's making her hoarse; noticed change in the voice; getting hard to swallow; when she drinks, it feels like it gets caught and then releases on down; doesn't choke her when she eats Knot does not feel hard No fevers; no weight loss; no trouble breathing, no night sweats No heart palpitations; no recent cough, except the sensation of it is causing her to cough a little; no recent URI; no reflux or heartburn; she still using PPI (makes her own homemade salsa; she is aware of long-term risks including pneumonia and C diff) No excessive xrays of the throat or teeth; does have history of chest xrays, hx of gamma knife on the head for trigeminal neuralgia No family hx of thyroid trouble  Relevant past medical, surgical, family and social history reviewed and updated as indicated. Interim medical history since our last visit reviewed. Allergies and medications reviewed and updated.  Review of Systems Per HPI unless specifically indicated above     Objective:    BP 148/78 mmHg  Pulse 73  Temp(Src) 98 F (36.7 C)  Ht  (1.626 m)  Wt 166 lb (75.297 kg)  BMI 28.48 kg/m2  SpO2 97%  Wt Readings from Last 3 Encounters:  05/02/15 166 lb (75.297 kg)  02/06/15 167 lb (75.751 kg)  01/23/15 167 lb (75.751 kg)    Physical Exam  Constitutional: She appears well-developed and  well-nourished.  No weight loss  HENT:  Head: Normocephalic and atraumatic.  Right Ear: Hearing, tympanic membrane, external ear and ear canal normal.  Left Ear: Hearing, tympanic membrane, external ear and ear canal normal.  Nose: No mucosal edema or rhinorrhea.  Mouth/Throat: Uvula is midline, oropharynx is clear and moist and mucous membranes are normal. No oral lesions. No dental abscesses or dental caries. No posterior oropharyngeal edema or posterior oropharyngeal erythema.  Eyes: EOM are normal. Right conjunctiva is not injected. Left conjunctiva is not injected. No scleral icterus.  Neck: Thyromegaly present.  Palpable soft mass in the lower midline neck, sternoclavicular notch; mobile; nontender  Cardiovascular: Normal rate and regular rhythm.   Pulmonary/Chest: Effort normal and breath sounds normal.  Skin: No ecchymosis and no rash noted.  tanned  Psychiatric: She has a normal mood and affect. Her behavior is normal.    Results for orders placed or performed during the hospital encounter of 02/16/15  Glucose, fasting  Result Value Ref Range   Glucose, Fasting 111 (H) 65 - 99 mg/dL  Glucose, 2 hour  Result Value Ref Range   Glucose, 2 hour 171 (H) 70 - 139 mg/dL      Assessment & Plan:   Problem List Items Addressed This Visit      Digestive  GERD (gastroesophageal reflux disease)    She is aware of risks of long-term use of PPIs      Relevant Medications   omeprazole (PRILOSEC) 20 MG capsule   Dysphagia    Refer to ENT      Relevant Orders   Ambulatory referral to ENT   DG Chest 2 View     Other   Neck mass - Primary    [place] ordered, refer to ENT      Relevant Orders   Ambulatory referral to ENT   DG Chest 2 View   US Soft Tissue Head/Neck   Hoarseness of voice    [place] of neck and refer to ENT      Relevant Orders   Ambulatory referral to ENT   DG Chest 2 View   US Soft Tissue Head/Neck   Cough    Chest xray ordered; likely related to swelling  lower neck      Relevant Orders   Ambulatory referral to ENT   DG Chest 2 View       Follow up plan: No Follow-up on file.

## 2015-05-02 NOTE — Assessment & Plan Note (Signed)
She is aware of risks of long-term use of PPIs

## 2015-05-02 NOTE — Assessment & Plan Note (Signed)
[place] ordered, refer to ENT

## 2015-05-02 NOTE — Assessment & Plan Note (Signed)
Chest xray ordered; likely related to swelling lower neck

## 2015-05-02 NOTE — Patient Instructions (Addendum)
We'll have you see Dr. Jenne Campus Please have the chest xray done today or tomorrow We'll schedule the ultrasound Avoid large, hard foods like steak and hard bread; try to eat soft foods and chew thoroughly, be careful to not choke If you have not heard anything from my staff by Monday about any orders/referrals/studies from today, please contact us here to follow-up [phone] Keep your appointment in October and we'll do labs then (please do come fasting)

## 2015-05-07 ENCOUNTER — Telehealth: Payer: Self-pay | Admitting: Family Medicine

## 2015-05-07 ENCOUNTER — Ambulatory Visit
Admission: RE | Admit: 2015-05-07 | Discharge: 2015-05-07 | Disposition: A | Discharge status: Home / Self Care | Payer: No Typology Code available for payment source | Source: Ambulatory Visit | Attending: Family Medicine | Admitting: Family Medicine

## 2015-05-07 DIAGNOSIS — R490 Dysphonia: Secondary | ICD-10-CM

## 2015-05-07 DIAGNOSIS — R221 Localized swelling, mass and lump, neck: Secondary | ICD-10-CM | POA: Diagnosis not present

## 2015-05-07 NOTE — Telephone Encounter (Signed)
Please give patient the good news that her [place] did not show anything worrisome in terms of thyroid enlargement or masses or enlarged glands in her neck; the last thing I can think to do is a chest CT to see if something pushing up from below; CXR not definitive; I have ordered chest CT

## 2015-05-07 NOTE — Assessment & Plan Note (Signed)
Sternoclavicular notch; radiologist recommends chest CT

## 2015-05-08 NOTE — Telephone Encounter (Signed)
Left message to call.

## 2015-05-09 NOTE — Telephone Encounter (Signed)
Left message to call.

## 2015-05-16 NOTE — Telephone Encounter (Signed)
If he has checked it and is monitoring it, then okay to cancel the chest CT Please let her know; thank you

## 2015-05-16 NOTE — Telephone Encounter (Signed)
Patient notified. She went to Dr. Willeen Cass at ENT and he did a test and suspects it to be a lipoma and will recheck it. She wants to know if you think she should still have the Chest CT or wait?

## 2015-05-17 NOTE — Telephone Encounter (Signed)
Patient notified

## 2015-05-21 ENCOUNTER — Telehealth: Payer: Self-pay | Admitting: Family Medicine

## 2015-05-21 NOTE — Telephone Encounter (Signed)
Please see last phone note; I thought Amy cancelled the chest CT scan Patient is seeing another provider for this Please cancel chest CT

## 2015-05-22 ENCOUNTER — Other Ambulatory Visit: Payer: Self-pay

## 2015-05-22 MED ORDER — METFORMIN HCL ER 500 MG PO TB24: 500.0000 mg | ORAL_TABLET | Freq: Every evening | ORAL | Status: DC

## 2015-05-22 NOTE — Telephone Encounter (Signed)
appt 05/30/15 Last A1C and creatinine reviewed Rx sent

## 2015-05-22 NOTE — Telephone Encounter (Signed)
She needs a refill on Metformin, would like to get a 90 day supply.

## 2015-05-30 ENCOUNTER — Ambulatory Visit (INDEPENDENT_AMBULATORY_CARE_PROVIDER_SITE_OTHER): Payer: No Typology Code available for payment source | Admitting: Family Medicine

## 2015-05-30 ENCOUNTER — Encounter: Payer: Self-pay | Admitting: Family Medicine

## 2015-05-30 VITALS — BP 147/84 | HR 75 | Temp 97.8°F | Ht 64.0 in | Wt 165.0 lb

## 2015-05-30 DIAGNOSIS — K219 Gastro-esophageal reflux disease without esophagitis: Secondary | ICD-10-CM | POA: Diagnosis not present

## 2015-05-30 DIAGNOSIS — Z5181 Encounter for therapeutic drug level monitoring: Secondary | ICD-10-CM

## 2015-05-30 DIAGNOSIS — R221 Localized swelling, mass and lump, neck: Secondary | ICD-10-CM

## 2015-05-30 DIAGNOSIS — N898 Other specified noninflammatory disorders of vagina: Secondary | ICD-10-CM

## 2015-05-30 DIAGNOSIS — R5383 Other fatigue: Secondary | ICD-10-CM

## 2015-05-30 DIAGNOSIS — L301 Dyshidrosis [pompholyx]: Secondary | ICD-10-CM | POA: Diagnosis not present

## 2015-05-30 DIAGNOSIS — I10 Essential (primary) hypertension: Secondary | ICD-10-CM | POA: Diagnosis not present

## 2015-05-30 DIAGNOSIS — N939 Abnormal uterine and vaginal bleeding, unspecified: Secondary | ICD-10-CM | POA: Insufficient documentation

## 2015-05-30 DIAGNOSIS — E663 Overweight: Secondary | ICD-10-CM | POA: Diagnosis not present

## 2015-05-30 DIAGNOSIS — R739 Hyperglycemia, unspecified: Secondary | ICD-10-CM

## 2015-05-30 DIAGNOSIS — E119 Type 2 diabetes mellitus without complications: Secondary | ICD-10-CM | POA: Insufficient documentation

## 2015-05-30 DIAGNOSIS — E785 Hyperlipidemia, unspecified: Secondary | ICD-10-CM | POA: Diagnosis not present

## 2015-05-30 LAB — URINALYSIS, ROUTINE W REFLEX MICROSCOPIC
Bilirubin, UA: NEGATIVE
GLUCOSE, UA: NEGATIVE
Ketones, UA: NEGATIVE
NITRITE UA: NEGATIVE
PH UA: 6.5 (ref 5.0–7.5)
Protein, UA: NEGATIVE
RBC UA: NEGATIVE
Specific Gravity, UA: 1.02 (ref 1.005–1.030)
UUROB: 0.2 mg/dL (ref 0.2–1.0)

## 2015-05-30 LAB — MICROSCOPIC EXAMINATION

## 2015-05-30 MED ORDER — CLOBETASOL PROPIONATE 0.05 % EX CREA: 1.0000 "application " | TOPICAL_CREAM | Freq: Two times a day (BID) | CUTANEOUS | Status: DC

## 2015-05-30 NOTE — Progress Notes (Signed)
BP 147/84 mmHg  Pulse 75  Temp(Src) 97.8 F (36.6 C)  Ht  (1.626 m)  Wt 165 lb (74.844 kg)  BMI 28.31 kg/m2  SpO2 98%   Subjective:    Patient ID: Amanda Lyons, female    DOB: 01-04-1956, 59 y.o.   MRN: [id number]  HPI: Amanda Lyons is a 59 y.o. female  Chief Complaint  Patient presents with  . Hypertension  . Hyperlipidemia  . Hyperglycemia  . Gastroesophageal Reflux    her ENT doctor increased her Omeprazole from  to , but wonders if she should take the higher strength. She states the  works well.   She is here to recheck her sugar and cholesterol High blood pressure; she checks it away from here, 140 on top or so and sometimes 130s; 70-80 on the bottom; tries to limit salt; no decongestants; does eat processed pork; no black licorice High blood sugars; trying to limit sweets, but drinks sweetened tea, half of a Pepsi a day; decreased energy lately; eats white wheat; eats white potatoes occasionally; she has not had time to do the diabetic teaching  Dr. Willeen Cass wanted to change her dosage of omeprazole 40 mg; I cannot find the note; he says the reflux can cause her symptoms; Dr. 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 Family hx very positive for diabetes, sister lost her toe b/c of DM complications; both parents had DM Interim medical history since our last visit reviewed.saw ENT Allergies and medications reviewed and updated.  Review of Systems  Constitutional: Positive for fatigue.  Respiratory: Negative for shortness of breath and wheezing.        Just a little short of breath at work once in a while if overdoing it, but much better than before; very active at work  Skin: Positive for rash (both palms and fingers; wants to see dermatologist).  Neurological: Negative for numbness.   Psychiatric/Behavioral: Negative for sleep disturbance.   Per HPI unless specifically indicated above     Objective:    BP 147/84 mmHg  Pulse 75  Temp(Src) 97.8 F (36.6 C)  Ht  (1.626 m)  Wt 165 lb (74.844 kg)  BMI 28.31 kg/m2  SpO2 98%  Wt Readings from Last 3 Encounters:  05/30/15 165 lb (74.844 kg)  05/02/15 166 lb (75.297 kg)  02/06/15 167 lb (75.751 kg)    Physical Exam  Constitutional: She appears well-developed and well-nourished. No distress.  HENT:  Head: Normocephalic and atraumatic.  Eyes: EOM are normal. No scleral icterus.  Neck: No thyromegaly present.  Cardiovascular: Normal rate, regular rhythm and normal heart sounds.   No murmur heard. Pulmonary/Chest: Effort normal and breath sounds normal. No respiratory distress. She has no wheezes.  Abdominal: Soft. Bowel sounds are normal. She exhibits no distension.  Musculoskeletal: Normal range of motion. She exhibits no edema.  Neurological: She is alert. She exhibits normal muscle tone.  Skin: Skin is warm and dry. Rash (plaques of erythematous skin on the palms, fingers also; some veiscular appearance; no drainage) noted. She is not diaphoretic. No pallor.  Psychiatric: She has a normal mood and affect. Her behavior is normal. Judgment and thought content normal.   Diabetic Foot Form - Detailed   Diabetic Foot Exam - detailed  Diabetic Foot exam was performed with the following findings:  Yes 05/30/2015  9:17 AM  Visual Foot Exam completed.:  Yes  Is there swelling or and abnormal foot shape?:  Yes (Comment: 4th toes ride high, bunions)  Are the toenails long?:  No  Are the toenails thick?:  No  Are the toenails ingrown?:  No    Pulse Foot Exam completed.:  Yes  Right Dorsalis Pedis:  Present Left Dorsalis Pedis:  Present  Sensory Foot Exam Completed.:  Yes  Swelling:  No  Semmes-Weinstein Monofilament Test  R Site 1-Great Toe:  Pos L Site 1-Great Toe:  Pos  R Site 4:  Pos L Site 4:  Pos  R Site  5:  Pos L Site 5:  Pos         Results for orders placed or performed during the hospital encounter of 02/16/15  Glucose, fasting  Result Value Ref Range   Glucose, Fasting 111 (H) 65 - 99 mg/dL  Glucose, 2 hour  Result Value Ref Range   Glucose, 2 hour 171 (H) 70 - 139 mg/dL      Assessment & Plan:   Problem List Items Addressed This Visit      Cardiovascular and Mediastinum   Hypertension - Primary    Try to lose a little more weight; try the DASH guidelines; limit processed pork and red meat; continue BP medicine        Digestive   GERD (gastroesophageal reflux disease)    Patient will increase omeprazole from 20 mg to 40 mg daily per Dr. Talmage NapBennett's recommendations        Musculoskeletal and Integument   Dyshidrotic eczema    Explained dx; avoid hot water, chemicals on the hands; start corticosteroid cream and refer to derm      Relevant Orders   Vit D  25 hydroxy (rtn osteoporosis monitoring)   Ambulatory referral to Dermatology     Other   Dyslipidemia (Chronic)    Limit saturated fats; check lipids today; work on weight loss      Relevant Orders   Lipid Panel w/o Chol/HDL Ratio   Overweight (BMI 25.0-29.9)    Continue to work on weigh tloss      Neck mass    Appreciate the care and evaluation by ENT; he will monitor and remove if needed      Hyperglycemia    Check A1C today and glucose (fasting today); weight loss, avoidance of sweets and simple carbs; foot exam done by MD today      Relevant Orders   Hgb A1c w/o eAG   Vaginal spotting   Relevant Orders   Urinalysis, Routine w reflex microscopic (not at [facility])    Other Visit Diagnoses    Other fatigue        Relevant Orders    CBC With Differential/Platelet    Comprehensive metabolic panel    Medication monitoring encounter        Relevant Orders    Comprehensive metabolic panel      She declined flu shot  Follow up plan: Return in about 1 week (around 06/06/2015) for new problem,  spotting AND 3 months for sugars, blood pressure.  Orders Placed This Encounter  Procedures  . Urinalysis, Routine w reflex microscopic (not at [facility])  . Vit D  25 hydroxy (rtn osteoporosis monitoring)  . CBC With Differential/Platelet  . Lipid Panel w/o Chol/HDL Ratio  . Comprehensive metabolic panel  . Hgb A1c w/o eAG  . Ambulatory referral to Dermatology   Meds ordered this  encounter  Medications  . clobetasol cream (TEMOVATE) 0.05 %    Sig: Apply 1 application topically 2 (two) times daily. Too strong for face, underarms, groin, children    Dispense:  30 g    Refill:  0   An after-visit summary was printed and given to the patient at check-out.  Please see the patient instructions which may contain other information and recommendations beyond what is mentioned above in the assessment and plan.

## 2015-05-30 NOTE — Patient Instructions (Addendum)
FRONT STAFF--> Request note from Dr. Willeen CassBennett regarding your recent visit Please do take the medicine recommended by Dr. Willeen CassBennett as he directed Raise the head of the bed by elevating the mattress off of the base Avoid triggers (peppermint, coffee, carbonated beverages, spicy foods, tomato-based foods, chocolate, onions, etc.) Try to limit saturated fats in your diet (bologna, hot dogs, barbeque, cheeseburgers, hamburgers, steak, bacon, sausage, cheese, etc.) and get more fresh fruits, vegetables, and whole grains Keep your hands out of hot water and harsh chemicals Use the new cream on the hands We'll refer you to the dermatologist Return later this week or next to discuss and work-up the vaginal spotting

## 2015-05-30 NOTE — Assessment & Plan Note (Signed)
Explained dx; avoid hot water, chemicals on the hands; start corticosteroid cream and refer to derm

## 2015-05-30 NOTE — Assessment & Plan Note (Signed)
Appreciate the care and evaluation by ENT; he will monitor and remove if needed

## 2015-05-30 NOTE — Assessment & Plan Note (Signed)
Try to lose a little more weight; try the DASH guidelines; limit processed pork and red meat; continue BP medicine

## 2015-05-30 NOTE — Assessment & Plan Note (Signed)
Check A1C today and glucose (fasting today); weight loss, avoidance of sweets and simple carbs; foot exam done by MD today

## 2015-05-30 NOTE — Assessment & Plan Note (Signed)
Limit saturated fats; check lipids today; work on weight loss

## 2015-05-30 NOTE — Assessment & Plan Note (Signed)
Patient will increase omeprazole from 20 mg to 40 mg daily per Dr. Talmage NapBennett's recommendations

## 2015-05-30 NOTE — Assessment & Plan Note (Signed)
Continue to work on weight loss 

## 2015-05-31 ENCOUNTER — Telehealth: Payer: Self-pay | Admitting: Family Medicine

## 2015-05-31 NOTE — Telephone Encounter (Signed)
Pt's husband is here for an appt would like to get samples of the cream Dr. Sherie DonLada stated the patient should use for her skin issue. Pt's husband would like to have the medication before he leaves the office today. Thanks.

## 2015-06-01 ENCOUNTER — Telehealth: Payer: Self-pay

## 2015-06-01 NOTE — Telephone Encounter (Signed)
We don't have samples; I didn't get asked for samples of anything on Thursday, so no, they were not notified

## 2015-06-01 NOTE — Telephone Encounter (Signed)
Patient notified

## 2015-06-01 NOTE — Telephone Encounter (Signed)
Dr. Sherie DonLada, do you know if this was handled on Thursday? I know we don't get samples of it, but didn't know if they were notified.

## 2015-06-01 NOTE — Telephone Encounter (Signed)
I called patient about labs; continue metformin; lipids look great

## 2015-06-01 NOTE — Telephone Encounter (Signed)
She would like her lab results. Ok to leave message if she does not pick up.

## 2015-06-04 LAB — COMPREHENSIVE METABOLIC PANEL
ALT: 26 IU/L (ref 0–32)
AST: 19 IU/L (ref 0–40)
Albumin/Globulin Ratio: 1.7 (ref 1.1–2.5)
Albumin: 4.2 g/dL (ref 3.5–5.5)
Alkaline Phosphatase: 119 IU/L — ABNORMAL HIGH (ref 39–117)
BUN/Creatinine Ratio: 33 — ABNORMAL HIGH (ref 9–23)
BUN: 23 mg/dL (ref 6–24)
Bilirubin Total: 0.3 mg/dL (ref 0.0–1.2)
CALCIUM: 10.2 mg/dL (ref 8.7–10.2)
CO2: 25 mmol/L (ref 18–29)
CREATININE: 0.69 mg/dL (ref 0.57–1.00)
Chloride: 100 mmol/L (ref 97–106)
GFR calc Af Amer: 112 mL/min/{1.73_m2} (ref >59)
GFR, EST NON AFRICAN AMERICAN: 97 mL/min/{1.73_m2} (ref >59)
Globulin, Total: 2.5 g/dL (ref 1.5–4.5)
Glucose: 112 mg/dL — ABNORMAL HIGH (ref 65–99)
Potassium: 4 mmol/L (ref 3.5–5.2)
Sodium: 140 mmol/L (ref 136–144)
TOTAL PROTEIN: 6.7 g/dL (ref 6.0–8.5)

## 2015-06-04 LAB — LIPID PANEL W/O CHOL/HDL RATIO
Cholesterol, Total: 163 mg/dL (ref 100–199)
HDL: 68 mg/dL (ref >39)
LDL CALC: 73 mg/dL (ref 0–99)
Triglycerides: 112 mg/dL (ref 0–149)
VLDL CHOLESTEROL CAL: 22 mg/dL (ref 5–40)

## 2015-06-04 LAB — VITAMIN D 25 HYDROXY (VIT D DEFICIENCY, FRACTURES): VIT D 25 HYDROXY: 28.1 ng/mL — AB (ref 30.0–100.0)

## 2015-06-04 LAB — HGB A1C W/O EAG: Hgb A1c MFr Bld: 6.6 % — ABNORMAL HIGH (ref 4.8–5.6)

## 2015-06-07 ENCOUNTER — Telehealth: Payer: Self-pay | Admitting: Family Medicine

## 2015-06-07 NOTE — Telephone Encounter (Signed)
I did talk to patient last week

## 2015-06-08 ENCOUNTER — Ambulatory Visit: Payer: No Typology Code available for payment source | Admitting: Family Medicine

## 2015-06-25 ENCOUNTER — Encounter: Payer: Self-pay | Admitting: Family Medicine

## 2015-06-25 ENCOUNTER — Ambulatory Visit (INDEPENDENT_AMBULATORY_CARE_PROVIDER_SITE_OTHER): Payer: No Typology Code available for payment source | Admitting: Family Medicine

## 2015-06-25 VITALS — BP 116/64 | HR 105 | Temp 99.5°F | Ht 64.5 in | Wt 162.0 lb

## 2015-06-25 DIAGNOSIS — Z1239 Encounter for other screening for malignant neoplasm of breast: Secondary | ICD-10-CM

## 2015-06-25 DIAGNOSIS — J019 Acute sinusitis, unspecified: Secondary | ICD-10-CM | POA: Insufficient documentation

## 2015-06-25 DIAGNOSIS — J018 Other acute sinusitis: Secondary | ICD-10-CM | POA: Diagnosis not present

## 2015-06-25 MED ORDER — HYDROCOD POLST-CPM POLST ER 10-8 MG/5ML PO SUER: 5.0000 mL | Freq: Two times a day (BID) | ORAL | Status: DC | PRN

## 2015-06-25 MED ORDER — AMOXICILLIN 875 MG PO TABS: 875.0000 mg | ORAL_TABLET | Freq: Two times a day (BID) | ORAL | Status: DC

## 2015-06-25 NOTE — Progress Notes (Signed)
BP 116/64 mmHg  Pulse 105  Temp(Src) 99.5 F (37.5 C)  Ht 5' 4.5" (1.638 m)  Wt 162 lb (73.483 kg)  BMI 27.39 kg/m2  SpO2 97%   Subjective:    Patient ID: Amanda Lyons, female    DOB: Nov 23, 1955, 59 y.o.   MRN: [id number]  HPI: Amanda Lyons is a 59 y.o. female  Chief Complaint  Patient presents with  . URI    x 2 days   patient getting worse with marked head cold drainage congestion and cough really hit her hard over the weekend Patient has tried TheraFlu with not much relief Has taken Aleve also maybe helped a little Also has been feverish with chills and low-grade fever Relevant past medical, surgical, family and social history reviewed and updated as indicated. Interim medical history since our last visit reviewed. Allergies and medications reviewed and updated.  Review of Systems  Constitutional: Positive for fever, chills, diaphoresis and fatigue.  HENT: Positive for congestion, ear pain, facial swelling, postnasal drip, rhinorrhea, sinus pressure, sneezing and sore throat.   Respiratory: Positive for cough.   Cardiovascular: Negative.   Gastrointestinal: Abdominal distention: chest pain from coughing.    Per HPI unless specifically indicated above     Objective:    BP 116/64 mmHg  Pulse 105  Temp(Src) 99.5 F (37.5 C)  Ht 5' 4.5" (1.638 m)  Wt 162 lb (73.483 kg)  BMI 27.39 kg/m2  SpO2 97%  Wt Readings from Last 3 Encounters:  06/25/15 162 lb (73.483 kg)  05/30/15 165 lb (74.844 kg)  05/02/15 166 lb (75.297 kg)    Physical Exam  Constitutional: She is oriented to person, place, and time. She appears well-developed and well-nourished. No distress.  HENT:  Head: Normocephalic and atraumatic.  Right Ear: Hearing and external ear normal.  Left Ear: Hearing normal.  Nose: Nose normal.  Mouth/Throat: Oropharyngeal exudate present.  Left ear inflamed  Eyes: Conjunctivae and lids are normal. Right eye exhibits no discharge. Left eye exhibits no  discharge. No scleral icterus.  Neck: No thyromegaly present.  Cardiovascular: Normal rate, regular rhythm and normal heart sounds.   Pulmonary/Chest: Effort normal and breath sounds normal. No respiratory distress.  Musculoskeletal: Normal range of motion.  Lymphadenopathy:    She has no cervical adenopathy.  Neurological: She is alert and oriented to person, place, and time.  Skin: Skin is intact. No rash noted.  Psychiatric: She has a normal mood and affect. Her speech is normal and behavior is normal. Judgment and thought content normal. Cognition and memory are normal.    Results for orders placed or performed in visit on 05/30/15  Microscopic Examination  Result Value Ref Range   WBC, UA 0-5 0 -  5 /hpf   RBC, UA 0-2 0 -  2 /hpf   Epithelial Cells (non renal) 0-10 0 - 10 /hpf   Bacteria, UA Few None seen/Few  Urinalysis, Routine w reflex microscopic (not at [facility])  Result Value Ref Range   Specific Gravity, UA 1.020 1.005 - 1.030   pH, UA 6.5 5.0 - 7.5   Color, UA Yellow Yellow   Appearance Ur Clear Clear   Leukocytes, UA Trace (A) Negative   Protein, UA Negative Negative/Trace   Glucose, UA Negative Negative   Ketones, UA Negative Negative   RBC, UA Negative Negative   Bilirubin, UA Negative Negative   Urobilinogen, Ur 0.2 0.2 - 1.0 mg/dL   Nitrite, UA Negative Negative   Microscopic Examination  See below:   Vit D  25 hydroxy (rtn osteoporosis monitoring)  Result Value Ref Range   Vit D, 25-Hydroxy 28.1 (L) 30.0 - 100.0 ng/mL  Lipid Panel w/o Chol/HDL Ratio  Result Value Ref Range   Cholesterol, Total 163 100 - 199 mg/dL   Triglycerides 161112 0 - 149 mg/dL   HDL 68 >09>39 mg/dL   VLDL Cholesterol Cal 22 5 - 40 mg/dL   LDL Calculated 73 0 - 99 mg/dL  Comprehensive metabolic panel  Result Value Ref Range   Glucose 112 (H) 65 - 99 mg/dL   BUN 23 6 - 24 mg/dL   Creatinine, Ser 6.040.69 0.57 - 1.00 mg/dL   GFR calc non Af Amer 97 >59 mL/min/1.73   GFR calc Af Amer 112 >59  mL/min/1.73   BUN/Creatinine Ratio 33 (H) 9 - 23   Sodium 140 136 - 144 mmol/L   Potassium 4.0 3.5 - 5.2 mmol/L   Chloride 100 97 - 106 mmol/L   CO2 25 18 - 29 mmol/L   Calcium 10.2 8.7 - 10.2 mg/dL   Total Protein 6.7 6.0 - 8.5 g/dL   Albumin 4.2 3.5 - 5.5 g/dL   Globulin, Total 2.5 1.5 - 4.5 g/dL   Albumin/Globulin Ratio 1.7 1.1 - 2.5   Bilirubin Total 0.3 0.0 - 1.2 mg/dL   Alkaline Phosphatase 119 (H) 39 - 117 IU/L   AST 19 0 - 40 IU/L   ALT 26 0 - 32 IU/L  Hgb A1c w/o eAG  Result Value Ref Range   Hgb A1c MFr Bld 6.6 (H) 4.8 - 5.6 %      Assessment & Plan:   Problem List Items Addressed This Visit      Respiratory   Sinusitis, acute    Discussed sinusitis bronchitis care and treatment over-the-counter medications Tylenol Cold and sinus will also get cough syrup with cautions about not driving his syrup has codeine in it Use of antibiotics      Relevant Medications   amoxicillin (AMOXIL) 875 MG tablet   chlorpheniramine-HYDROcodone (TUSSIONEX PENNKINETIC ER) 10-8 MG/5ML SUER    Other Visit Diagnoses    Breast cancer screening    -  Primary    Relevant Orders    MM Digital Screening        Follow up plan: Return for Physical Exam.

## 2015-06-25 NOTE — Assessment & Plan Note (Signed)
Discussed sinusitis bronchitis care and treatment over-the-counter medications Tylenol Cold and sinus will also get cough syrup with cautions about not driving his syrup has codeine in it Use of antibiotics

## 2015-06-28 ENCOUNTER — Telehealth: Payer: Self-pay | Admitting: Family Medicine

## 2015-06-28 NOTE — Telephone Encounter (Signed)
Looking at her note from that day, she was supposed to return to see me within the week for evaluation of her vaginal spotting; have her schedule an appt with me (15 minutes, within 2 weeks) and we'll get the CBC at that visit; I'll cancel the CBC in the orders if it's the wrong code; thank you

## 2015-06-28 NOTE — Telephone Encounter (Signed)
-----   Message from Elby ShowersAmy J Workman, CMA sent at 06/28/2015  9:30 AM EST ----- Regarding: RE: Please check on the CBC from October; thank you The wrong test code was ordered and it did not get released, do you want her to come back in and have it drawn? ----- Message -----    From: Kerman PasseyMelinda P Darian Cansler, MD    Sent: 06/27/2015   5:59 PM      To: Amy Charlyne PetrinJ Workman, CMA Subject: Please check on the CBC from October; thank #

## 2015-06-29 NOTE — Telephone Encounter (Signed)
I called patient and left vm to call us back and reschedule her missed appointment with Dr. Sherie DonLada.

## 2015-07-10 ENCOUNTER — Encounter: Payer: Self-pay | Admitting: Family Medicine

## 2015-07-10 ENCOUNTER — Telehealth: Payer: Self-pay | Admitting: Family Medicine

## 2015-07-10 ENCOUNTER — Ambulatory Visit (INDEPENDENT_AMBULATORY_CARE_PROVIDER_SITE_OTHER): Payer: No Typology Code available for payment source | Admitting: Family Medicine

## 2015-07-10 ENCOUNTER — Ambulatory Visit
Admission: RE | Admit: 2015-07-10 | Discharge: 2015-07-10 | Disposition: A | Discharge status: Home / Self Care | Payer: No Typology Code available for payment source | Source: Ambulatory Visit | Attending: Family Medicine | Admitting: Family Medicine

## 2015-07-10 VITALS — BP 143/91 | HR 87 | Temp 98.1°F | Wt 162.0 lb

## 2015-07-10 DIAGNOSIS — R918 Other nonspecific abnormal finding of lung field: Secondary | ICD-10-CM | POA: Insufficient documentation

## 2015-07-10 DIAGNOSIS — R05 Cough: Secondary | ICD-10-CM | POA: Insufficient documentation

## 2015-07-10 DIAGNOSIS — R062 Wheezing: Secondary | ICD-10-CM | POA: Insufficient documentation

## 2015-07-10 DIAGNOSIS — J410 Simple chronic bronchitis: Secondary | ICD-10-CM | POA: Diagnosis not present

## 2015-07-10 DIAGNOSIS — J42 Unspecified chronic bronchitis: Secondary | ICD-10-CM | POA: Insufficient documentation

## 2015-07-10 DIAGNOSIS — R059 Cough, unspecified: Secondary | ICD-10-CM

## 2015-07-10 DIAGNOSIS — E559 Vitamin D deficiency, unspecified: Secondary | ICD-10-CM | POA: Insufficient documentation

## 2015-07-10 DIAGNOSIS — J018 Other acute sinusitis: Secondary | ICD-10-CM

## 2015-07-10 DIAGNOSIS — I10 Essential (primary) hypertension: Secondary | ICD-10-CM

## 2015-07-10 DIAGNOSIS — J849 Interstitial pulmonary disease, unspecified: Secondary | ICD-10-CM | POA: Insufficient documentation

## 2015-07-10 MED ORDER — BENZONATATE 100 MG PO CAPS: 100.0000 mg | ORAL_CAPSULE | Freq: Three times a day (TID) | ORAL | Status: DC | PRN

## 2015-07-10 NOTE — Patient Instructions (Signed)
Try vitamin C (orange juice if not diabetic or vitamin C tablets) and drink green tea to help your immune system during your illness Get plenty of rest and hydration Out of work today and [place] Use the new cough drops as directed if needed In the future, do NOT take hydrocodone AND oxycodone in the same day Go to the ER if trouble breathing, fever, worsening symptoms, etc. Try to follow the DASH guidelines

## 2015-07-10 NOTE — Assessment & Plan Note (Signed)
Suspect this is the remnants of viral bronchitis; will get CXR since her symptoms have persisted so long; no new antibiotics today; tessalon perles; cautioned her to never take hydrocodone (last cough syrup) and oxycodone (her pain medicine) in the same day

## 2015-07-10 NOTE — Assessment & Plan Note (Signed)
Resolved on exam today. 

## 2015-07-10 NOTE — Telephone Encounter (Signed)
Patient notified and notified of CT Scan. She did agree to extend her note out of work to Thursday. Will tell Almira CoasterGina to extend note.   She seemed to understand that interstitial markings on the xray report.  I told her to call us back if she needed anything.

## 2015-07-10 NOTE — Assessment & Plan Note (Signed)
Going on four weeks now per patient; may just be residual cough; will avoid SABA given her BP and lack of wheezing on exam; patient cannot take prednisone (makes her sick she says); get CXR, rest, hydration

## 2015-07-10 NOTE — Telephone Encounter (Signed)
Please let patient know that there was no pneumonia on her chest xray, so no need for antibiotics There are some interstitial markings on the xray report The interstitium is like the connective tissue part of the lung, like the scaffolding or support beams of a building The markings or prominence of the scaffolding can mean several different things, sometimes inflammation, sometimes fluid, sometimes chronic, sometimes something that completely goes away on its own I want to get a chest CT to look a little more closely at her lungs Stay on the treatment we discussed already today Have her schedule a f/u appt with me one day after the chest CT and we'll go over all of this in detail If she wants to be out of work Wed and Thursday, then that's okay with me Amanda Lyons(Gina can extend the note)

## 2015-07-10 NOTE — Progress Notes (Signed)
BP 143/91 mmHg  Pulse 87  Temp(Src) 98.1 F (36.7 C)  Wt 162 lb (73.483 kg)  SpO2 94%   Subjective:    Patient ID: Amanda Lyons, female    DOB: [birth year], 57 y.o.   MRN: [id number]  HPI: Amanda RocherRebecca B Maclaughlin is a 59 y.o. female  Chief Complaint  Patient presents with  . URI    x 4 weeks, has taken a round of antibiotics, no improvement.   She saw Dr. Dossie Arbourrissman about 4 weeks ago; she says she was sick a week before she even saw him; she was treated with antibiotics for bronchitis, ear infection, and sinusitis Did not get better at all with the antibiotics; treatted with tussionex and augmentin Still coughing and hurting along the ribs and left side from coughing so much Not bringing up anything Some wheezing, not rattling necessarily No fevers No travel No rash Ears are fine now Sinuses are fine now No known sick contacts She has been using cough drops No bad body aches; occasional headaches from coughing  BP up a little today; not taking any decongestants Taking vit D supplement  Relevant past medical, surgical, family and social history reviewed and updated as indicated. Interim medical history since our last visit reviewed. Allergies and medications reviewed and updated.  Review of Systems  Constitutional: Negative for fever.  HENT: Positive for sore throat (comes and goes). Negative for ear pain. Tinnitus: sometimes. Voice change: hoarse at times from coughing so much.   Eyes: Negative for discharge (do get watery; film over the contacts).  Respiratory: Positive for cough and wheezing.   Hematological: Positive for adenopathy (under the neck).   Per HPI unless specifically indicated above     Objective:    BP 143/91 mmHg  Pulse 87  Temp(Src) 98.1 F (36.7 C)  Wt 162 lb (73.483 kg)  SpO2 94%  Wt Readings from Last 3 Encounters:  07/10/15 162 lb (73.483 kg)  06/25/15 162 lb (73.483 kg)  05/30/15 165 lb (74.844 kg)    Physical Exam  Constitutional: She  appears well-developed and well-nourished. No distress.  HENT:  Head: Normocephalic and atraumatic.  Right Ear: Hearing and external ear normal. Tympanic membrane is not injected, not scarred and not erythematous. A middle ear effusion (scant) is present.  Left Ear: Hearing and external ear normal. Tympanic membrane is not injected, not scarred and not erythematous. A middle ear effusion (scant) is present.  Nose: No mucosal edema or rhinorrhea.  Mouth/Throat: Oropharynx is clear and moist and mucous membranes are normal.  Eyes: EOM are normal. Right eye exhibits no discharge. Left eye exhibits no discharge. Right conjunctiva is not injected. Left conjunctiva is not injected. No scleral icterus.  Cardiovascular: Normal rate and regular rhythm.   Pulmonary/Chest: Effort normal and breath sounds normal. No respiratory distress. She has no wheezes. She has no rales.  Abdominal: She exhibits no distension.  Lymphadenopathy:       Head (right side): No submental and no submandibular adenopathy present.       Head (left side): No submental and no submandibular adenopathy present.    She has no cervical adenopathy.  Skin: Skin is warm and dry. No rash noted. She is not diaphoretic. No pallor.  Psychiatric: She has a normal mood and affect.    Results for orders placed or performed in visit on 05/30/15  Microscopic Examination  Result Value Ref Range   WBC, UA 0-5 0 -  5 /hpf   RBC,  UA 0-2 0 -  2 /hpf   Epithelial Cells (non renal) 0-10 0 - 10 /hpf   Bacteria, UA Few None seen/Few  Urinalysis, Routine w reflex microscopic (not at [facility])  Result Value Ref Range   Specific Gravity, UA 1.020 1.005 - 1.030   pH, UA 6.5 5.0 - 7.5   Color, UA Yellow Yellow   Appearance Ur Clear Clear   Leukocytes, UA Trace (A) Negative   Protein, UA Negative Negative/Trace   Glucose, UA Negative Negative   Ketones, UA Negative Negative   RBC, UA Negative Negative   Bilirubin, UA Negative Negative    Urobilinogen, Ur 0.2 0.2 - 1.0 mg/dL   Nitrite, UA Negative Negative   Microscopic Examination See below:   Vit D  25 hydroxy (rtn osteoporosis monitoring)  Result Value Ref Range   Vit D, 25-Hydroxy 28.1 (L) 30.0 - 100.0 ng/mL  Lipid Panel w/o Chol/HDL Ratio  Result Value Ref Range   Cholesterol, Total 163 100 - 199 mg/dL   Triglycerides 161 0 - 149 mg/dL   HDL 68 >09 mg/dL   VLDL Cholesterol Cal 22 5 - 40 mg/dL   LDL Calculated 73 0 - 99 mg/dL  Comprehensive metabolic panel  Result Value Ref Range   Glucose 112 (H) 65 - 99 mg/dL   BUN 23 6 - 24 mg/dL   Creatinine, Ser 6.04 0.57 - 1.00 mg/dL   GFR calc non Af Amer 97 >59 mL/min/1.73   GFR calc Af Amer 112 >59 mL/min/1.73   BUN/Creatinine Ratio 33 (H) 9 - 23   Sodium 140 136 - 144 mmol/L   Potassium 4.0 3.5 - 5.2 mmol/L   Chloride 100 97 - 106 mmol/L   CO2 25 18 - 29 mmol/L   Calcium 10.2 8.7 - 10.2 mg/dL   Total Protein 6.7 6.0 - 8.5 g/dL   Albumin 4.2 3.5 - 5.5 g/dL   Globulin, Total 2.5 1.5 - 4.5 g/dL   Albumin/Globulin Ratio 1.7 1.1 - 2.5   Bilirubin Total 0.3 0.0 - 1.2 mg/dL   Alkaline Phosphatase 119 (H) 39 - 117 IU/L   AST 19 0 - 40 IU/L   ALT 26 0 - 32 IU/L  Hgb A1c w/o eAG  Result Value Ref Range   Hgb A1c MFr Bld 6.6 (H) 4.8 - 5.6 %      Assessment & Plan:   Problem List Items Addressed This Visit      Cardiovascular and Mediastinum   Hypertension    Not quite to goal, but sick today; follow the DASH guidelines; continue the medicines; avoid decongestants        Respiratory   Sinusitis, acute    Resolved on exam today      Relevant Medications   benzonatate (TESSALON) 100 MG capsule   Chronic bronchitis (HCC)    Going on four weeks now per patient; may just be residual cough; will avoid SABA given her BP and lack of wheezing on exam; patient cannot take prednisone (makes her sick she says); get CXR, rest, hydration        Other   Cough - Primary    Suspect this is the remnants of viral  bronchitis; will get CXR since her symptoms have persisted so long; no new antibiotics today; tessalon perles; cautioned her to never take hydrocodone (last cough syrup) and oxycodone (her pain medicine) in the same day      Relevant Orders   DG Chest 2 View   Vitamin D  deficiency    Continue supplementation       Other Visit Diagnoses    Wheezing        likely inflammation from coughing; will avoid SABA and prednisone, though; mild; cough suppressant, rest, hydration    Relevant Orders    DG Chest 2 View       Follow up plan: Return if symptoms worsen or fail to improve.  Meds ordered this encounter  Medications  . benzonatate (TESSALON) 100 MG capsule    Sig: Take 1 capsule (100 mg total) by mouth 3 (three) times daily as needed for cough.    Dispense:  21 capsule    Refill:  0   Orders Placed This Encounter  Procedures  . DG Chest 2 View   An after-visit summary was printed and given to the patient at check-out.  Please see the patient instructions which may contain other information and recommendations beyond what is mentioned above in the assessment and plan.  Out of work yesterday and today; if patient needs to be out tomorrow, she may call for a note, but I explained I do not want her to be out the rest of hte week and then call us back later to get a retroactive work note b/c I won't be able to do that

## 2015-07-10 NOTE — Assessment & Plan Note (Signed)
Continue supplementation  ?

## 2015-07-10 NOTE — Assessment & Plan Note (Signed)
Not quite to goal, but sick today; follow the DASH guidelines; continue the medicines; avoid decongestants

## 2015-07-11 ENCOUNTER — Ambulatory Visit
Admission: RE | Admit: 2015-07-11 | Discharge: 2015-07-11 | Disposition: A | Discharge status: Home / Self Care | Payer: No Typology Code available for payment source | Source: Ambulatory Visit | Attending: Family Medicine | Admitting: Family Medicine

## 2015-07-11 ENCOUNTER — Encounter: Payer: Self-pay | Admitting: Family Medicine

## 2015-07-11 DIAGNOSIS — R05 Cough: Secondary | ICD-10-CM | POA: Diagnosis not present

## 2015-07-12 ENCOUNTER — Encounter: Payer: Self-pay | Admitting: Family Medicine

## 2015-07-12 ENCOUNTER — Telehealth: Payer: Self-pay | Admitting: Internal Medicine

## 2015-07-12 ENCOUNTER — Ambulatory Visit (INDEPENDENT_AMBULATORY_CARE_PROVIDER_SITE_OTHER): Payer: No Typology Code available for payment source | Admitting: Family Medicine

## 2015-07-12 VITALS — BP 158/92 | HR 80 | Temp 98.0°F | Wt 161.0 lb

## 2015-07-12 DIAGNOSIS — R062 Wheezing: Secondary | ICD-10-CM

## 2015-07-12 DIAGNOSIS — I10 Essential (primary) hypertension: Secondary | ICD-10-CM | POA: Diagnosis not present

## 2015-07-12 DIAGNOSIS — R05 Cough: Secondary | ICD-10-CM | POA: Diagnosis not present

## 2015-07-12 DIAGNOSIS — R739 Hyperglycemia, unspecified: Secondary | ICD-10-CM | POA: Diagnosis not present

## 2015-07-12 DIAGNOSIS — J219 Acute bronchiolitis, unspecified: Secondary | ICD-10-CM | POA: Diagnosis not present

## 2015-07-12 DIAGNOSIS — IMO0001 Reserved for inherently not codable concepts without codable children: Secondary | ICD-10-CM

## 2015-07-12 DIAGNOSIS — J849 Interstitial pulmonary disease, unspecified: Secondary | ICD-10-CM

## 2015-07-12 DIAGNOSIS — R059 Cough, unspecified: Secondary | ICD-10-CM

## 2015-07-12 DIAGNOSIS — R0602 Shortness of breath: Secondary | ICD-10-CM

## 2015-07-12 MED ORDER — HYDROCOD POLST-CPM POLST ER 10-8 MG/5ML PO SUER: 5.0000 mL | Freq: Two times a day (BID) | ORAL | Status: DC | PRN

## 2015-07-12 MED ORDER — PREDNISONE 10 MG PO TABS: 30.0000 mg | ORAL_TABLET | Freq: Every day | ORAL | Status: DC

## 2015-07-12 MED ORDER — ALBUTEROL SULFATE HFA 108 (90 BASE) MCG/ACT IN AERS: INHALATION_SPRAY | RESPIRATORY_TRACT | Status: DC

## 2015-07-12 MED ORDER — LEVOFLOXACIN 500 MG PO TABS: 500.0000 mg | ORAL_TABLET | Freq: Every day | ORAL | Status: DC

## 2015-07-12 NOTE — Progress Notes (Signed)
BP 158/92 mmHg  Pulse 80  Temp(Src) 98 F (36.7 C)  Wt 161 lb (73.029 kg)  SpO2 95%  Subjective:    Patient ID: Amanda Lyons, female    DOB: [birth year], 59 y.o.   MRN: [id number]  HPI: Amanda Lyons is a 59 y.o. female  Chief Complaint  Patient presents with  . Results    discuss xray and Chest CT results   She is here with her husband to go over her chest CT results and for re-evaluation of her cough Stress and coughing and not sleeping well, etc No travel but works at the [employer] so no telling what might come through there, she says No fevers, but she had chills on Sunday night Quick recap of illness: she saw Dr. Dossie Arbourrissman four weeks ago, treated with antibiotics but not much better; amoxicillin; no decongestants, but taking tussionex; even that is not really helping; has not gotten better; I saw her last and we got a chest xray and then chest CT Has cough fits and once she starts, she just keeps coughing No rash  No swelling in the legs Wood heat, cat and dog, but nothing new in the home or work environment  Relevant past medical, surgical, family and social history reviewed and updated as indicated. Interim medical history since our last visit reviewed. Past Medical History  Diagnosis Date  . Hypertension   . GERD (gastroesophageal reflux disease)   . History of anxiety     had anxiety attacks  . History of kidney stones   . Trigeminal neuralgia   . HNP (herniated nucleus pulposus), lumbar 05/02/2013  . Dyslipidemia 11/08/2013  . Chest pain 11/08/2013    Family History  Problem Relation Age of Onset  . CVA Father   . Heart failure Father   . Heart disease Father   . Diabetes Father   . Hypertension Father   . Ovarian cancer Mother   . Heart failure Maternal Grandmother   . Heart attack Maternal Grandfather   . Diabetes Sister   . Hypertension Sister   no fam hx of lung disease  Social History   Social History  . Marital Status: Married    Spouse  Name: N/A  . Number of Children: N/A  . Years of Education: N/A   Social History Main Topics  . Smoking status: Never Smoker   . Smokeless tobacco: Never Used  . Alcohol Use: No  . Drug Use: No  . Sexual Activity: Not Asked   Other Topics Concern  . None   Social History Narrative  Husband is a Education officer, environmentalpastor  Allergies and medications reviewed and updated.  Review of Systems Per HPI unless specifically indicated above  Chest xray 07/10/2015 (copied and pasted, reviewed entire report with patient and husband) IMPRESSION: 1. Chronic interstitial prominence bilaterally. There is no evidence of pneumonia. No pulmonary parenchymal masses are observed. 2. Mild enlargement of the cardiac silhouette without pulmonary edema. 3. If the patient's symptoms persist despite therapy, chest CT scanning would be the most useful next imaging step.  Chest CT 07/11/2015 (copied and pasted, reviewed entire report with patient and husband in detail) IMPRESSION: 1. No evidence of interstitial lung disease. 2. Minimal ill-defined peribronchovascular nodularity in the superior segment right lower lobe, indicative of an infectious bronchiolitis.     Objective:    BP 158/92 mmHg  Pulse 80  Temp(Src) 98 F (36.7 C)  Wt 161 lb (73.029 kg)  SpO2 95%  Wt Readings from  Last 3 Encounters:  07/13/15 162 lb 9.6 oz (73.755 kg)  07/12/15 161 lb (73.029 kg)  07/10/15 162 lb (73.483 kg)    Physical Exam  Constitutional: She appears well-developed and well-nourished. No distress.  HENT:  Head: Normocephalic and atraumatic.  Right Ear: External ear normal.  Left Ear: External ear normal.  Nose: Nose normal.  Mouth/Throat: Oropharynx is clear and moist. No oropharyngeal exudate.  Eyes: EOM are normal. Right eye exhibits no discharge. Left eye exhibits no discharge. No scleral icterus.  Neck: Neck supple. No tracheal deviation present.  Cardiovascular: Normal rate and regular rhythm.   Pulmonary/Chest:  No accessory muscle usage. No respiratory distress. She has no decreased breath sounds. She has no wheezes.  Equivocal rub lateral right lower lobe region; no wheezes; frequent spastic cough  Lymphadenopathy:    She has no cervical adenopathy.  Skin: Skin is warm and dry. She is not diaphoretic. No cyanosis. No pallor.  Psychiatric: She has a normal mood and affect.  Good eye contact with examiner   Results for orders placed or performed in visit on 05/30/15  Microscopic Examination  Result Value Ref Range   WBC, UA 0-5 0 -  5 /hpf   RBC, UA 0-2 0 -  2 /hpf   Epithelial Cells (non renal) 0-10 0 - 10 /hpf   Bacteria, UA Few None seen/Few  Urinalysis, Routine w reflex microscopic (not at [facility])  Result Value Ref Range   Specific Gravity, UA 1.020 1.005 - 1.030   pH, UA 6.5 5.0 - 7.5   Color, UA Yellow Yellow   Appearance Ur Clear Clear   Leukocytes, UA Trace (A) Negative   Protein, UA Negative Negative/Trace   Glucose, UA Negative Negative   Ketones, UA Negative Negative   RBC, UA Negative Negative   Bilirubin, UA Negative Negative   Urobilinogen, Ur 0.2 0.2 - 1.0 mg/dL   Nitrite, UA Negative Negative   Microscopic Examination See below:   Vit D  25 hydroxy (rtn osteoporosis monitoring)  Result Value Ref Range   Vit D, 25-Hydroxy 28.1 (L) 30.0 - 100.0 ng/mL  Lipid Panel w/o Chol/HDL Ratio  Result Value Ref Range   Cholesterol, Total 163 100 - 199 mg/dL   Triglycerides 409 0 - 149 mg/dL   HDL 68 >81 mg/dL   VLDL Cholesterol Cal 22 5 - 40 mg/dL   LDL Calculated 73 0 - 99 mg/dL  Comprehensive metabolic panel  Result Value Ref Range   Glucose 112 (H) 65 - 99 mg/dL   BUN 23 6 - 24 mg/dL   Creatinine, Ser 1.91 0.57 - 1.00 mg/dL   GFR calc non Af Amer 97 >59 mL/min/1.73   GFR calc Af Amer 112 >59 mL/min/1.73   BUN/Creatinine Ratio 33 (H) 9 - 23   Sodium 140 136 - 144 mmol/L   Potassium 4.0 3.5 - 5.2 mmol/L   Chloride 100 97 - 106 mmol/L   CO2 25 18 - 29 mmol/L   Calcium 10.2  8.7 - 10.2 mg/dL   Total Protein 6.7 6.0 - 8.5 g/dL   Albumin 4.2 3.5 - 5.5 g/dL   Globulin, Total 2.5 1.5 - 4.5 g/dL   Albumin/Globulin Ratio 1.7 1.1 - 2.5   Bilirubin Total 0.3 0.0 - 1.2 mg/dL   Alkaline Phosphatase 119 (H) 39 - 117 IU/L   AST 19 0 - 40 IU/L   ALT 26 0 - 32 IU/L  Hgb A1c w/o eAG  Result Value Ref Range  Hgb A1c MFr Bld 6.6 (H) 4.8 - 5.6 %      Assessment & Plan:   Problem List Items Addressed This Visit      Cardiovascular and Mediastinum   Hypertension    Not at goal; patient has been sick, not feeling well; watch closely        Respiratory   Interstitial lung disorders (HCC)    Noted on chest xray but not on CT scan; pulmonologist said CT more reliable; will have her see pulmonologist tomorrow      Relevant Medications   levofloxacin (LEVAQUIN) 500 MG tablet   chlorpheniramine-HYDROcodone (TUSSIONEX) 10-8 MG/5ML SUER   albuterol (PROVENTIL HFA;VENTOLIN HFA) 108 (90 BASE) MCG/ACT inhaler   Bronchiolitis    Reviewed CXR and chest CT with patient and husband; discussed case with pulmonologist by phone who graciously looked at her images and recommended treatment; discussed with patient and her husband; will start levaquin, have her see pulmonologist tomorrow        Other   Cough - Primary    Going on for weeks now, s/p CXR and chest CT and spirometry; discussed with pulmonologist; they will graciously see her tomorrow      Relevant Orders   Spirometry with graph (Completed)   Hyperglycemia    Prednisone may cause sugars to climb temporarily, but we'll tolerate some highs for her pulmonary condition; also discussed other side effects (energy, insomnia, etc.)       Other Visit Diagnoses    Wheezing        Relevant Orders    Spirometry with graph (Completed)    Shortness of breath dyspnea           Follow up plan: Return in about 8 days (around 07/20/2015).  Notes: levaquin x 10d pred 30 qd x 5d Not nec to start gabapentin Albuterol 2  puffs tid x 3 days scheduled, then prn 10 am Dr. Sung Amabile tomorrow  Meds ordered this encounter  Medications  . DISCONTD: chlorpheniramine-HYDROcodone (TUSSIONEX) 10-8 MG/5ML SUER    Sig:   . DISCONTD: predniSONE (DELTASONE) 10 MG tablet    Sig: Take 3 tablets (30 mg total) by mouth daily with lunch.    Dispense:  15 tablet    Refill:  0  . levofloxacin (LEVAQUIN) 500 MG tablet    Sig: Take 1 tablet (500 mg total) by mouth daily. On an empty stomach    Dispense:  10 tablet    Refill:  0  . chlorpheniramine-HYDROcodone (TUSSIONEX) 10-8 MG/5ML SUER    Sig: Take 5 mLs by mouth every 12 (twelve) hours as needed for cough.    Dispense:  115 mL    Refill:  0  . albuterol (PROVENTIL HFA;VENTOLIN HFA) 108 (90 BASE) MCG/ACT inhaler    Sig: Two puffs by mouth three times a day for three days, then two puffs every four hours if needed    Dispense:  1 Inhaler    Refill:  0    An after-visit summary was printed and given to the patient at check-out.  Please see the patient instructions which may contain other information and recommendations beyond what is mentioned above in the assessment and plan.  Face-to-face time with patient was more than 25 minutes, >50% time spent counseling and coordination of care

## 2015-07-12 NOTE — Patient Instructions (Addendum)
Please start the prednisone today and take with food Start the antibiotic Try vitamin C (orange juice if not diabetic or vitamin C tablets) and drink green tea to help your immune system during your illness Get plenty of rest and hydration Please do see the pulmonologist tomorrow at 10:00 am, arrive at 9:45 am GINA--> out of work for one more full week, to return tentatively on December 12th

## 2015-07-12 NOTE — Telephone Encounter (Signed)
Received call from patient PMD, Dr. Sherie DonLada for recurrent bronchitis and patient with worsening clinical status after a round of antibiotics.  Brief Hx 59 yo female never smoker, with acute bronchitis over the past 4-5 weeks, treated with amoxicillin, with no significant improvement. Presented today at primary care office after having a follow-up x-ray that showed possible interstitial lung disease. Primary care physician ordered a high-resolution CAT scan that showed no interstitial lung disease but findings consistent with infective bronchiolitis, patient with significant coughing spasms, dyspnea on exertion, with no clinical improvement after having antibiotics and bronchodilators (albuterol). I reviewed the CT scan with primary care physician over the phone and clinical history, plan as dictated below.  Plan: Recurrent Bronchitis/Infective Bronchiolitis -Levaquin 500 mg 1 tab daily 7 days, prednisone 30 mg daily 5 days, scheduled albuterol 2 puffs 3 times a day 3 days -[facility]  -VM

## 2015-07-13 ENCOUNTER — Encounter: Payer: Self-pay | Admitting: Family Medicine

## 2015-07-13 ENCOUNTER — Telehealth: Payer: Self-pay

## 2015-07-13 ENCOUNTER — Encounter: Payer: Self-pay | Admitting: Pulmonary Disease

## 2015-07-13 ENCOUNTER — Ambulatory Visit (INDEPENDENT_AMBULATORY_CARE_PROVIDER_SITE_OTHER): Payer: No Typology Code available for payment source | Admitting: Pulmonary Disease

## 2015-07-13 VITALS — BP 148/82 | HR 93 | Ht 62.0 in | Wt 162.6 lb

## 2015-07-13 DIAGNOSIS — I10 Essential (primary) hypertension: Secondary | ICD-10-CM | POA: Diagnosis not present

## 2015-07-13 DIAGNOSIS — J209 Acute bronchitis, unspecified: Secondary | ICD-10-CM

## 2015-07-13 DIAGNOSIS — R059 Cough, unspecified: Secondary | ICD-10-CM

## 2015-07-13 DIAGNOSIS — R05 Cough: Secondary | ICD-10-CM | POA: Diagnosis not present

## 2015-07-13 MED ORDER — LOSARTAN POTASSIUM-HCTZ 100-12.5 MG PO TABS: 1.0000 | ORAL_TABLET | Freq: Every day | ORAL | Status: DC

## 2015-07-13 MED ORDER — PREDNISONE 20 MG PO TABS: 20.0000 mg | ORAL_TABLET | Freq: Every day | ORAL | Status: DC

## 2015-07-13 NOTE — Telephone Encounter (Signed)
Spoke with patient and stated that it was just a one time thing that she had vaginal bleeding, she is better now. She has an appointment on 07/20/15 for a f/u of yesterday. Thanks.

## 2015-07-13 NOTE — Patient Instructions (Addendum)
Complete Levaquin as prescribed Prednisone as prescribed - adjusted dose today Tussionex twice a day on schedule for next 3-4 days, then as needed after that Cont tessalon perles - use liberally even if it is hard to tell that they are doing much Stop benazapril - HCTZ (losentin) Hyzaar daily it place of Losentin Follow up next

## 2015-07-13 NOTE — Telephone Encounter (Signed)
Please make sure she has appt for CBC and vaginal spotting, if you can't reach her to schedule, send letter and then close

## 2015-07-13 NOTE — Telephone Encounter (Signed)
Patient notified, she has follow up appointment with Pulm next Thursday and us next Friday. Will recheck her BP then.

## 2015-07-13 NOTE — Telephone Encounter (Signed)
She just left from Dr. Westley HummerSimond's office and he wants to change her Benezapril because it could also be causing more coughing. He wrote her a new rx for Losartan-HCT. She wants to know your thoughts on this before starting it.

## 2015-07-13 NOTE — Telephone Encounter (Signed)
I'm so glad she was able to be seen so quickly Let her know that I approve of the medicine change and think that will be fine She should have follow-up and labs about 2-3 weeks after the change, if she is going to see pulm around then for the BP/lab part of things too, that's great If she doesn't have f/u with pulm around that time, please offer appt here during that time frame; 15 minutes is fine

## 2015-07-14 NOTE — Progress Notes (Signed)
PULMONARY CONSULT NOTE  Requesting MD/Service: Baruch Gouty Date of initial consultation: 07/13/15 Reason for consultation: severe intractable cough  HPI:  89 F in her USOH until approx 5 weeks ago when she developed symptoms of a URI and was diagnosed with sinusitis, otitis and bronchitis. She was treated with amoxicillin and a cough suppressant with minimal benefit. She has been plagued with intractable vigorous rattling cough since that time that has also failed to respond to Tessalon. She underwent a CXR which was interpreted as abnormal and a follow up CT chest was performed. The results are discussed below. She is now referred for this cough. She denies fever, chills, sweats, pleuritic or anginal CP, hemoptysis, LE edema and calf tenderness. OF note, she is on an ACEI. This medication was started 3-4 yrs ago and has been well tolerated. 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 She has no identifiable sick contacts. There is no history of TB or positive skin test.   She was started on levofloxacin and prednisone which were first taken on the day of this evaluation   Past Medical History  Diagnosis Date  . Hypertension   . GERD (gastroesophageal reflux disease)   . History of anxiety     had anxiety attacks  . History of kidney stones   . Trigeminal neuralgia   . HNP (herniated nucleus pulposus), lumbar 05/02/2013  . Dyslipidemia 11/08/2013  . Chest pain 11/08/2013    MEDICATIONS: reviewed. Includes benazapril/HCTZ  Social History   Social History  . Marital Status: Married    Spouse Name: N/A  . Number of Children: N/A  . Years of Education: N/A   Occupational History  . Not on file.   Social History Main Topics  . Smoking status: Never Smoker   . Smokeless tobacco: Never Used  . Alcohol Use: No  . Drug Use: No  . Sexual Activity: Not on file   Other Topics Concern  . Not on file   Social History Narrative    Family History   Problem Relation Age of Onset  . CVA Father   . Heart failure Father   . Heart disease Father   . Diabetes Father   . Hypertension Father   . Ovarian cancer Mother   . Heart failure Maternal Grandmother   . Heart attack Maternal Grandfather   . Diabetes Sister   . Hypertension Sister     ROS - As per HPI. She does report ST, abdominal pain and HAs all attributed to vigorous coughing  Filed Vitals:   07/13/15 0947  BP: 148/82  Pulse: 93  Height:  (1.575 m)  Weight: 162 lb 9.6 oz (73.755 kg)  SpO2: 96%    EXAM:  Gen: WDWN, appears anxious. Hoarse quality to voice. Cough frequently HEENT: All WNL Neck: NO LAN, no JVD noted Lungs: slightly coarse BS, no adventitious sounds Cardiovascular: Reg rate, normal rhythm, no M noted Abdomen: Soft, NT +BS Ext: no C/C/E Neuro: CNs intact, motor/sens grossly intact Skin: No lesions noted   DATA:  BMP Latest Ref Rng 05/30/2015 02/06/2015 10/09/2014  Glucose 65 - 99 mg/dL 161(W) 960(A) 540(J)  BUN 6 - 24 mg/dL Creatinine 0.57 - 1.00 mg/dL 8.11 9.14 7.82  BUN/Creat Ratio 9 - 23 33(H) 32(H) -  Sodium 136 - 144 mmol/L 140 139 138  Potassium 3.5 - 5.2 mmol/L 4.0 4.4 3.8  Chloride 97 - 106 mmol/L 100 99 103  CO2 18 - 29  mmol/L 25 26 28   Calcium 8.7 - 10.2 mg/dL 16.110.2 9.8 09.610.3   CBC Latest Ref Rng 10/09/2014 08/23/2014 11/14/2013  WBC 4.0 - 10.5 K/uL 8.0 7.3 6.6  Hemoglobin 12.0 - 15.0 g/dL 04.513.5 40.913.4 81.113.6  Hematocrit 36.0 - 46.0 % 40.0 41.0 40.7  Platelets 150.0 - 400.0 K/uL 352.0 331 341.0    CXR  07/10/15: No acute disease HRCT 11/30: Minimal ill-defined peribronchovascular nodularity in the superior segment right lower lobe, c/w infectious bronchiolitis  IMPRESSION:   1) Severe cough 2) Acute bronchitis vs bronchiolitis, possibly infectious. Likely now that any infectious component has passed and we are dealing with a post-infectious phenomenon 3) Chronic ACEI therapy - although she has tolerated this medication well  until recently, it is possible that it is now functioning as an amplifier of her cough and its continued use will make further evaluation over time difficult    PLAN:  Complete Levaquin as prescribed Agree with Prednisone - adjusted dose today to 60 mg daily X 3, 40 mg X 3, 20 mg X 3, stop  Tussionex twice a day on schedule for next 3-4 days, then as needed after that Cont tessalon perles - use liberally even if it is hard to tell that they are doing much Stop benazapril - HCTZ (losentin) Hyzaar daily in place of Losentin Follow up next week with me  Billy Fischeravid Simonds, MD PCCM service Mobile [phone] Pager [phone]

## 2015-07-16 NOTE — Assessment & Plan Note (Signed)
Noted on chest xray but not on CT scan; pulmonologist said CT more reliable; will have her see pulmonologist tomorrow

## 2015-07-16 NOTE — Assessment & Plan Note (Signed)
Prednisone may cause sugars to climb temporarily, but we'll tolerate some highs for her pulmonary condition; also discussed other side effects (energy, insomnia, etc.)

## 2015-07-16 NOTE — Assessment & Plan Note (Signed)
Not at goal; patient has been sick, not feeling well; watch closely

## 2015-07-16 NOTE — Assessment & Plan Note (Signed)
Going on for weeks now, s/p CXR and chest CT and spirometry; discussed with pulmonologist; they will graciously see her tomorrow

## 2015-07-16 NOTE — Assessment & Plan Note (Signed)
Reviewed CXR and chest CT with patient and husband; discussed case with pulmonologist by phone who graciously looked at her images and recommended treatment; discussed with patient and her husband; will start levaquin, have her see pulmonologist tomorrow

## 2015-07-19 ENCOUNTER — Ambulatory Visit (INDEPENDENT_AMBULATORY_CARE_PROVIDER_SITE_OTHER): Payer: No Typology Code available for payment source | Admitting: Pulmonary Disease

## 2015-07-19 ENCOUNTER — Ambulatory Visit: Payer: No Typology Code available for payment source | Admitting: Family Medicine

## 2015-07-19 ENCOUNTER — Encounter: Payer: Self-pay | Admitting: Pulmonary Disease

## 2015-07-19 VITALS — BP 130/72 | HR 78 | Ht 62.0 in | Wt 163.4 lb

## 2015-07-19 DIAGNOSIS — R059 Cough, unspecified: Secondary | ICD-10-CM

## 2015-07-19 DIAGNOSIS — J019 Acute sinusitis, unspecified: Secondary | ICD-10-CM

## 2015-07-19 DIAGNOSIS — R05 Cough: Secondary | ICD-10-CM | POA: Diagnosis not present

## 2015-07-19 MED ORDER — TRIAMCINOLONE ACETONIDE 55 MCG/ACT NA AERO: 2.0000 | INHALATION_SPRAY | Freq: Every day | NASAL | Status: DC

## 2015-07-19 MED ORDER — AZELASTINE-FLUTICASONE 137-50 MCG/ACT NA SUSP
1.0000 | Freq: Every day | NASAL | Status: DC
Start: 2015-07-19 — End: 2015-08-27

## 2015-07-19 NOTE — Progress Notes (Signed)
PULMONARY OFFICE F/U NOTE  Requesting MD/Service: Baruch GoutyMelinda Lada Date of initial consultation: 07/13/15 Reason for consultation: severe intractable cough  Initial HPI 07/14/15:,  59 F in her USOH until approx 5 weeks ago when she developed symptoms of a URI and was diagnosed with sinusitis, otitis and bronchitis. She was treated with amoxicillin and a cough suppressant with minimal benefit. She has been plagued with intractable vigorous rattling cough since that time that has also failed to respond to Tessalon. She underwent a CXR which was interpreted as abnormal and a follow up CT chest was performed. The results are discussed below. She is now referred for this cough. She denies fever, chills, sweats, pleuritic or anginal CP, hemoptysis, LE edema and calf tenderness. OF note, she is on an ACEI. This medication was started 3-4 yrs ago and has been well tolerated. 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 She has no identifiable sick contacts. There is no history of TB or positive skin test.   She was started on levofloxacin and prednisone which were first taken on the day of this evaluation  ROV 07/19/15: Minimal to no improvement. Severe rhinitis noted on exam. Nasal steroid initiated. Recommended over the counter cold and sinus formula. To complete levofloxacin and prednisone as previously prescribed. To cont PPI   SUBJ: No better. Unable to work due to intractable cough.   Filed Vitals:   07/19/15 0943  BP: 130/72  Pulse: 78  Height: 5\' 2"  (1.575 m)  Weight: 163 lb 6.4 oz (74.118 kg)  SpO2: 97%    EXAM:  Gen: coughing vigorously during encounter HEENT: severe nasal mucosa erythema and edema, TCs and TMs normal Neck: NO LAN, no JVD noted Lungs: slightly coarse BS, no adventitious sounds Cardiovascular: Reg rate, normal rhythm, no M noted Abdomen: Soft, NT +BS Ext: no C/C/E Neuro: CNs intact, motor/sens grossly intact   DATA:  BMP Latest Ref  Rng 05/30/2015 02/06/2015 10/09/2014  Glucose 65 - 99 mg/dL 161(W112(H) 960(A112(H) 540(J135(H)  BUN 6 - 24 mg/dL 23 21 17   Creatinine 0.57 - 1.00 mg/dL 8.110.69 9.140.66 7.820.75  BUN/Creat Ratio 9 - 23 33(H) 32(H) -  Sodium 136 - 144 mmol/L 140 139 138  Potassium 3.5 - 5.2 mmol/L 4.0 4.4 3.8  Chloride 97 - 106 mmol/L 100 99 103  CO2 18 - 29 mmol/L 25 26 28   Calcium 8.7 - 10.2 mg/dL 95.610.2 9.8 21.310.3   CBC Latest Ref Rng 10/09/2014 08/23/2014 11/14/2013  WBC 4.0 - 10.5 K/uL 8.0 7.3 6.6  Hemoglobin 12.0 - 15.0 g/dL 08.613.5 57.813.4 46.913.6  Hematocrit 36.0 - 46.0 % 40.0 41.0 40.7  Platelets 150.0 - 400.0 K/uL 352.0 331 341.0    CXR  07/10/15: No acute disease HRCT 11/30: Minimal ill-defined peribronchovascular nodularity in the superior segment right lower lobe, c/w infectious bronchiolitis  IMPRESSION:   1) Intractable, nonproductive cough 2) Acute bronchitis vs bronchiolitis, possibly infectious. Likely now that any infectious component has passed and we are dealing with a post-infectious phenomenon 3) Chronic ACEI therapy - this was stopped 12/03 4) rhinosinusitis     PLAN:  Complete Levaquin as prescribed Complete prednisone as previously prescribed Cont Tussionex twice a day as needed Cont tessalon perles as needed Cont Hyzaar daily in place of Losentin Rx for Nasacort provided 12/08 Advil cold and sinus formulation or equivalent as directed F/U next week  Billy Fischeravid Jewelle Whitner, MD PCCM service Mobile [phone] Pager [phone]

## 2015-07-19 NOTE — Patient Instructions (Signed)
You continue to have severe cough. It is possible that the BP medication that we stopped last week is playing a role as it takes some time for this medication to wash out of your system. You also have severe inflammation in your nasal passages. Finally, there might be a cyclical or self-perpetuating component to the cough  1) I think that you need to plan to take this next week off from work 2) I have supplied a sample of a nasal steroid to be used each day 3) Find a good cold and sinus formulation such as Advil Cold and Sinus and use as directed 4) Follow up with me next week

## 2015-07-20 ENCOUNTER — Telehealth: Payer: Self-pay | Admitting: *Deleted

## 2015-07-20 ENCOUNTER — Ambulatory Visit: Payer: No Typology Code available for payment source | Admitting: Family Medicine

## 2015-07-20 ENCOUNTER — Telehealth: Payer: Self-pay | Admitting: Family Medicine

## 2015-07-20 ENCOUNTER — Encounter: Payer: Self-pay | Admitting: *Deleted

## 2015-07-20 NOTE — Telephone Encounter (Signed)
She saw the lung doctor yesterday. He went ahead and advised her that she needs to be off work again next week. He would like Dr. Sherie DonLada to write the note. She will pick it up when she comes in Wednesday to see us.

## 2015-07-20 NOTE — Telephone Encounter (Signed)
Pt calling stating that we told her not to go back to work until next week and that she needed a letter for her work.  And she was to go to PCP and get a letter for work next week but they had to cancelled her apt for today so she can't get the letter until next week.  Would like to know if we can write it instead.  Please advise

## 2015-07-20 NOTE — Telephone Encounter (Signed)
Letter done for pt until she sees DS on 07/27/15. At this time he can decide if she needs to be out an longer. Pt informed. Nothing further needed.

## 2015-07-20 NOTE — Telephone Encounter (Signed)
Pt would like a call back regarding a work note and fmla paperwork.

## 2015-07-23 ENCOUNTER — Other Ambulatory Visit: Payer: Self-pay | Admitting: Cardiology

## 2015-07-24 ENCOUNTER — Telehealth: Payer: Self-pay

## 2015-07-24 NOTE — Telephone Encounter (Signed)
She was calling to check on the status of her FMLA forms. Are they completed?

## 2015-07-25 ENCOUNTER — Encounter: Payer: Self-pay | Admitting: Family Medicine

## 2015-07-25 ENCOUNTER — Ambulatory Visit (INDEPENDENT_AMBULATORY_CARE_PROVIDER_SITE_OTHER): Payer: No Typology Code available for payment source | Admitting: Family Medicine

## 2015-07-25 VITALS — BP 146/89 | HR 83 | Temp 98.4°F | Wt 164.0 lb

## 2015-07-25 DIAGNOSIS — I10 Essential (primary) hypertension: Secondary | ICD-10-CM | POA: Diagnosis not present

## 2015-07-25 DIAGNOSIS — J410 Simple chronic bronchitis: Secondary | ICD-10-CM | POA: Diagnosis not present

## 2015-07-25 DIAGNOSIS — E119 Type 2 diabetes mellitus without complications: Secondary | ICD-10-CM

## 2015-07-25 NOTE — Assessment & Plan Note (Signed)
Appreciate the assistance of pulmonology colleague; she will see him on Friday of this week; [facility] her to be out through Jul 27, 2015; I would respectfully request that any further excuse or extension be through the pulmonology office; she will continue her current treatment

## 2015-07-25 NOTE — Patient Instructions (Signed)
We'll see you on or just after November 29, 2015 for your next diabetes visit Try vitamin C (orange juice if not diabetic or vitamin C tablets) and drink green tea to help your immune system during your illness Get plenty of rest and hydration

## 2015-07-25 NOTE — Assessment & Plan Note (Signed)
Updated problem list; next A1c due in late April

## 2015-07-25 NOTE — Telephone Encounter (Signed)
Yes finished this morning; she was here for appt too

## 2015-07-25 NOTE — Assessment & Plan Note (Signed)
Now off of the ACE-I and on ARB; she's been on it only 11 days; will recheck BP and BMP end of December

## 2015-07-25 NOTE — Progress Notes (Signed)
BP 146/89 mmHg  Pulse 83  Temp(Src) 98.4 F (36.9 C)  Wt 164 lb (74.39 kg)  SpO2 97%   Subjective:    Patient ID: Amanda Lyons, female    DOB: Aug 16, 1955, 59 y.o.   MRN: [id number]  HPI: Amanda RocherRebecca B Donna is a 59 y.o. female  Chief Complaint  Patient presents with  . Cough    1 week follow up. She is doing better, but still coughing. She states it's a little better.   She still has this cough; worse in the morning and evening, getting hot, laughter, really cold air, easily triggered and coughs many times in a row; hard to breathe at times  This is seven weeks of coughing Not coughing up any blood Chest is tight and sore from coughing so much, stomach muscle too No rash, no travel He said that her airways were still inflamed; ears were fine; sinuses inflamed She has finished all the antibiotics, still using nasal sprays  Sleeping off and on; some nights she wakes up every hour, some nights several hours in a row  They changed her blood pressure 11 days ago; now on ARB instead of ACE-I; he really thought the ACE-I was added fuel to the fire  Last note from pulmonologist reviewed; last section pasted and copied below: IMPRESSION:  1) Intractable, nonproductive cough 2) Acute bronchitis vs bronchiolitis, possibly infectious. Likely now that any infectious component has passed and we are dealing with a post-infectious phenomenon 3) Chronic ACEI therapy - this was stopped 12/03 4) rhinosinusitis    PLAN:  Complete Levaquin as prescribed Complete prednisone as previously prescribed Cont Tussionex twice a day as needed Cont tessalon perles as needed Cont Hyzaar daily in place of Losentin Rx for Nasacort provided 12/08 Advil cold and sinus formulation or equivalent as directed F/U next week  Relevant past medical, surgical, family and social history reviewed and updated as indicated. Interim medical history since our last visit reviewed. Allergies and medications reviewed  and updated.  Review of Systems  Per HPI unless specifically indicated above     Objective:    BP 146/89 mmHg  Pulse 83  Temp(Src) 98.4 F (36.9 C)  Wt 164 lb (74.39 kg)  SpO2 97%  Wt Readings from Last 3 Encounters:  07/25/15 164 lb (74.39 kg)  07/19/15 163 lb 6.4 oz (74.118 kg)  07/13/15 162 lb 9.6 oz (73.755 kg)    Physical Exam  Constitutional: She appears well-developed and well-nourished. No distress.  Eyes: EOM are normal. Right eye exhibits no discharge. Left eye exhibits no discharge. No scleral icterus.  Neck: No JVD present.  Cardiovascular: Normal rate and regular rhythm.   Pulmonary/Chest: Effort normal and breath sounds normal. No respiratory distress. She has no wheezes.  Frequent cough, spastic  Musculoskeletal: She exhibits no edema.  Skin: No pallor.  Psychiatric: She has a normal mood and affect.   Results for orders placed or performed in visit on 05/30/15  Microscopic Examination  Result Value Ref Range   WBC, UA 0-5 0 -  5 /hpf   RBC, UA 0-2 0 -  2 /hpf   Epithelial Cells (non renal) 0-10 0 - 10 /hpf   Bacteria, UA Few None seen/Few  Urinalysis, Routine w reflex microscopic (not [facility])  Result Value Ref Range   Specific Gravity, UA 1.020 1.005 - 1.030   pH, UA 6.5 5.0 - 7.5   Color, UA Yellow Yellow   Appearance Ur Clear Clear   Leukocytes,  UA Trace (A) Negative   Protein, UA Negative Negative/Trace   Glucose, UA Negative Negative   Ketones, UA Negative Negative   RBC, UA Negative Negative   Bilirubin, UA Negative Negative   Urobilinogen, Ur 0.2 0.2 - 1.0 mg/dL   Nitrite, UA Negative Negative   Microscopic Examination See below:   Vit D  25 hydroxy (rtn osteoporosis monitoring)  Result Value Ref Range   Vit D, 25-Hydroxy 28.1 (L) 30.0 - 100.0 ng/mL  Lipid Panel w/o Chol/HDL Ratio  Result Value Ref Range   Cholesterol, Total 163 100 - 199 mg/dL   Triglycerides 401 0 - 149 mg/dL   HDL 68 >02 mg/dL   VLDL Cholesterol Cal 22 5 - 40  mg/dL   LDL Calculated 73 0 - 99 mg/dL  Comprehensive metabolic panel  Result Value Ref Range   Glucose 112 (H) 65 - 99 mg/dL   BUN 23 6 - 24 mg/dL   Creatinine, Ser 7.25 0.57 - 1.00 mg/dL   GFR calc non Af Amer 97 >59 mL/min/1.73   GFR calc Af Amer 112 >59 mL/min/1.73   BUN/Creatinine Ratio 33 (H) 9 - 23   Sodium 140 136 - 144 mmol/L   Potassium 4.0 3.5 - 5.2 mmol/L   Chloride 100 97 - 106 mmol/L   CO2 25 18 - 29 mmol/L   Calcium 10.2 8.7 - 10.2 mg/dL   Total Protein 6.7 6.0 - 8.5 g/dL   Albumin 4.2 3.5 - 5.5 g/dL   Globulin, Total 2.5 1.5 - 4.5 g/dL   Albumin/Globulin Ratio 1.7 1.1 - 2.5   Bilirubin Total 0.3 0.0 - 1.2 mg/dL   Alkaline Phosphatase 119 (H) 39 - 117 IU/L   AST 19 0 - 40 IU/L   ALT 26 0 - 32 IU/L  Hgb A1c w/o eAG  Result Value Ref Range   Hgb A1c MFr Bld 6.6 (H) 4.8 - 5.6 %      Assessment & Plan:   Problem List Items Addressed This Visit      Cardiovascular and Mediastinum   Hypertension    Now off of the ACE-I and on ARB; she's been on it only 11 days; will recheck BP and BMP end of December        Respiratory   Chronic bronchitis (HCC)    Appreciate the assistance of pulmonology colleague; she will see him on Friday of this week; the FMLA paperwork I filled out covers her to be out through Jul 27, 2015; I would respectfully request that any further excuse or extension be through the pulmonology office; she will continue her current treatment        Endocrine   Type 2 diabetes mellitus (HCC) - Primary    Updated problem list; next A1c due in late April         Follow up plan: Return if symptoms worsen or fail to improve.  An after-visit summary was printed and given to the patient at check-out.  Please see the patient instructions which may contain other information and recommendations beyond what is mentioned above in the assessment and plan.

## 2015-07-27 ENCOUNTER — Telehealth: Payer: Self-pay

## 2015-07-27 ENCOUNTER — Ambulatory Visit (INDEPENDENT_AMBULATORY_CARE_PROVIDER_SITE_OTHER): Payer: No Typology Code available for payment source | Admitting: Pulmonary Disease

## 2015-07-27 ENCOUNTER — Encounter: Payer: Self-pay | Admitting: *Deleted

## 2015-07-27 ENCOUNTER — Encounter: Payer: Self-pay | Admitting: Pulmonary Disease

## 2015-07-27 VITALS — BP 144/82 | HR 88 | Ht 62.0 in | Wt 164.4 lb

## 2015-07-27 DIAGNOSIS — R059 Cough, unspecified: Secondary | ICD-10-CM

## 2015-07-27 DIAGNOSIS — J310 Chronic rhinitis: Secondary | ICD-10-CM | POA: Diagnosis not present

## 2015-07-27 DIAGNOSIS — J219 Acute bronchiolitis, unspecified: Secondary | ICD-10-CM | POA: Diagnosis not present

## 2015-07-27 DIAGNOSIS — R05 Cough: Secondary | ICD-10-CM

## 2015-07-27 MED ORDER — FLUTICASONE FUROATE-VILANTEROL 100-25 MCG/INH IN AEPB: 1.0000 | INHALATION_SPRAY | Freq: Every day | RESPIRATORY_TRACT | Status: DC

## 2015-07-27 MED ORDER — FLUTICASONE FUROATE-VILANTEROL 100-25 MCG/INH IN AEPB: 1.0000 | INHALATION_SPRAY | Freq: Every day | RESPIRATORY_TRACT | Status: AC

## 2015-07-27 MED ORDER — HYDROCOD POLST-CPM POLST ER 10-8 MG/5ML PO SUER: 5.0000 mL | Freq: Two times a day (BID) | ORAL | Status: DC | PRN

## 2015-07-27 NOTE — Progress Notes (Signed)
PULMONARY OFFICE F/U NOTE  Requesting MD/Service: Baruch Gouty Date of initial consultation: 07/13/15 Reason for consultation: severe intractable cough  Initial HPI 07/14/15:,  59 F in her USOH until approx 5 weeks ago when she developed symptoms of a URI and was diagnosed with sinusitis, otitis and bronchitis. She was treated with amoxicillin and a cough suppressant with minimal benefit. She has been plagued with intractable vigorous rattling cough since that time that has also failed to respond to Tessalon. She underwent a CXR which was interpreted as abnormal and a follow up CT chest was performed. The results are discussed below. She is now referred for this cough. She denies fever, chills, sweats, pleuritic or anginal CP, hemoptysis, LE edema and calf tenderness. OF note, she is on an ACEI. This medication was started 3-4 yrs ago and has been well tolerated. 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 She has no identifiable sick contacts. There is no history of TB or positive skin test.   She was started on levofloxacin and prednisone which were first taken on the day of this evaluation  ROV 07/19/15: Minimal to no improvement. Severe rhinitis noted on exam. Nasal steroid initiated. Recommended over the counter cold and sinus formula. To complete levofloxacin and prednisone as previously prescribed. To cont PPI ROV 07/27/15: Minimal improvement. Still coughing vigorously in exam room. Tussionex refilled. Trial of Breo inhaler (sample provided and Rx sent)  SUBJ: Minimal to mild improvement in cough. Still coughing intermittently in exam room. No other new complaints. Cough remains nonproductive. No fevers, chills, sweats, hemoptysis, purulent sputum, LE edema or calf tenderness. No dyspnea unless coughing vigorously  Filed Vitals:   07/27/15 1130  BP: 144/82  Pulse: 88  Height:  (1.575 m)  Weight: 164 lb 6.4 oz (74.571 kg)  SpO2: 96%    EXAM:   Gen: coughing intermittently during encounter HEENT: moderate nasal mucosal erythema, mucoid secretoins in L naris, TCs and TMs normal Neck: NO LAN, no JVD noted Lungs: normal BS, no adventitious sounds Cardiovascular: Reg rate, normal rhythm, no M noted Abdomen: Soft, NT +BS Ext: no C/C/E Neuro: CNs intact, motor/sens grossly intact   DATA:  BMP Latest Ref Rng 05/30/2015 02/06/2015 10/09/2014  Glucose 65 - 99 mg/dL 269(S) 854(O) 270(J)  BUN 6 - 24 mg/dL Creatinine 0.57 - 1.00 mg/dL 5.00 9.38 1.82  BUN/Creat Ratio 9 - 23 33(H) 32(H) -  Sodium 136 - 144 mmol/L 140 139 138  Potassium 3.5 - 5.2 mmol/L 4.0 4.4 3.8  Chloride 97 - 106 mmol/L 100 99 103  CO2 18 - 29 mmol/L Calcium 8.7 - 10.2 mg/dL 99.3 9.8 71.6   CBC Latest Ref Rng 10/09/2014 08/23/2014 11/14/2013  WBC 4.0 - 10.5 K/uL 8.0 7.3 6.6  Hemoglobin 12.0 - 15.0 g/dL 96.7 89.3 81.0  Hematocrit 36.0 - 46.0 % 40.0 41.0 40.7  Platelets 150.0 - 400.0 K/uL 352.0 331 341.0    CXR  07/10/15: No acute disease HRCT 11/30: Minimal ill-defined peribronchovascular nodularity in the superior segment right lower lobe, c/w infectious bronchiolitis  IMPRESSION:   1) Chronic cough - likely multifactorial.  2) CT chest 11/30 c/w bronchiolitis, possibly infectious. Any infectious component has passed and we are dealing with a post-infectious phenomenon 3) Chronic ACEI therapy - this was stopped 07/14/15 4) Chronic rhinosinusitis     PLAN:  Cont Tussionex twice a day as needed - Rx refilled Cont tessalon perles as needed Cont  Nasacort daily Cont Advil cold and sinus or equivalent PRN Trial of Breo 100/25 - if helps, refill prescription.  F/U 3-4 weeks with CXR   PT INSTRUCTIONS: I have refilled Tussionex cough suppressant I have supplied a sample of Breo inhaler  Use up the sample and then stop  If you can tell that the inhaler helped, either improved after starting or worsened after stopping, you have refills  available through your pharmacy Follow up in 4 weeks You should remain out of work until your cough has resolved sufficiently to allow you to return   Billy Fischeravid Simonds, MD PCCM service Mobile [phone] Pager [phone]

## 2015-07-27 NOTE — Patient Instructions (Addendum)
I have refilled Tussionex cough suppressant I have supplied a sample of Breo inhaler  Use up the sample and then stop  If you can tell that the inhaler helped, either improved after starting or worsened after stopping, you have refills available through your pharmacy Follow up in 4 weeks You should remain out of work until your cough has resolved sufficiently to allow you to return.

## 2015-07-27 NOTE — Telephone Encounter (Signed)
Pt called regarding a note regarding her FMLA. States she needs a copy of the letter Dr. Sung AmabileSimonds is sending . She would like to come pick it up Monday. Please call.

## 2015-07-27 NOTE — Telephone Encounter (Signed)
Informed pt that she can come by anytime and pick up copy of papers. Nothing further needed.

## 2015-07-28 ENCOUNTER — Other Ambulatory Visit: Payer: Self-pay | Admitting: Family Medicine

## 2015-07-28 NOTE — Telephone Encounter (Signed)
The ACE-I was replaced with ARB; this Rx request for benazepril-hctz is DENIED

## 2015-08-08 ENCOUNTER — Ambulatory Visit (INDEPENDENT_AMBULATORY_CARE_PROVIDER_SITE_OTHER): Payer: No Typology Code available for payment source | Admitting: Family Medicine

## 2015-08-08 ENCOUNTER — Other Ambulatory Visit: Payer: Self-pay

## 2015-08-08 ENCOUNTER — Encounter: Payer: Self-pay | Admitting: Family Medicine

## 2015-08-08 VITALS — BP 157/89 | HR 80 | Temp 98.1°F | Wt 164.0 lb

## 2015-08-08 DIAGNOSIS — E785 Hyperlipidemia, unspecified: Secondary | ICD-10-CM | POA: Diagnosis not present

## 2015-08-08 DIAGNOSIS — I10 Essential (primary) hypertension: Secondary | ICD-10-CM

## 2015-08-08 DIAGNOSIS — N939 Abnormal uterine and vaginal bleeding, unspecified: Secondary | ICD-10-CM

## 2015-08-08 DIAGNOSIS — N898 Other specified noninflammatory disorders of vagina: Secondary | ICD-10-CM | POA: Diagnosis not present

## 2015-08-08 DIAGNOSIS — E119 Type 2 diabetes mellitus without complications: Secondary | ICD-10-CM

## 2015-08-08 DIAGNOSIS — J410 Simple chronic bronchitis: Secondary | ICD-10-CM

## 2015-08-08 MED ORDER — GLUCOSE BLOOD VI STRP: ORAL_STRIP | Status: DC

## 2015-08-08 MED ORDER — AMLODIPINE BESYLATE 10 MG PO TABS: 10.0000 mg | ORAL_TABLET | Freq: Every day | ORAL | Status: DC

## 2015-08-08 NOTE — Patient Instructions (Addendum)
I would like to increase your amlodipine from 5 mg to 10 mg daily; I have written a new prescription; please let your cardiologist know what your BP was today (157/89) and ask if they agree with the increase Your goal blood pressure is less than 130 mmHg on top. Try to follow the DASH guidelines (DASH stands for Dietary Approaches to Stop Hypertension) Try to limit the sodium in your diet.  Ideally, consume less than 1.5 grams (less than 1,500mg ) per day. Do not add salt when cooking or at the table.  Check the sodium amount on labels when shopping, and choose items lower in sodium when given a choice. Avoid or limit foods that already contain a lot of sodium. Eat a diet rich in fruits and vegetables and whole grains. You will be due for your next diabetes visit on or after April 19th Please do see the diabetic educator Use lamisil or something similar on the feet  DASH Eating Plan DASH stands for "Dietary Approaches to Stop Hypertension." The DASH eating plan is a healthy eating plan that has been shown to reduce high blood pressure (hypertension). Additional health benefits may include reducing the risk of type 2 diabetes mellitus, heart disease, and stroke. The DASH eating plan may also help with weight loss. WHAT DO I NEED TO KNOW ABOUT THE DASH EATING PLAN? For the DASH eating plan, you will follow these general guidelines:  Choose foods with a percent daily value for sodium of less than 5% (as listed on the food label).  Use salt-free seasonings or herbs instead of table salt or sea salt.  Check with your health care provider or pharmacist before using salt substitutes.  Eat lower-sodium products, often labeled as "lower sodium" or "no salt added."  Eat fresh foods.  Eat more vegetables, fruits, and low-fat dairy products.  Choose whole grains. Look for the word "whole" as the first word in the ingredient list.  Choose fish and skinless chicken or [place] more often than red meat.  Limit fish, poultry, and meat to 6 oz (170 g) each day.  Limit sweets, desserts, sugars, and sugary drinks.  Choose heart-healthy fats.  Limit cheese to 1 oz (28 g) per day.  Eat more home-cooked food and less restaurant, buffet, and fast food.  Limit fried foods.  Cook foods using methods other than frying.  Limit canned vegetables. If you do use them, rinse them well to decrease the sodium.  When eating at a restaurant, ask that your food be prepared with less salt, or no salt if possible. WHAT FOODS CAN I EAT? Seek help from a dietitian for individual calorie needs. Grains Whole grain or whole wheat bread. Brown rice. Whole grain or whole wheat pasta. Quinoa, bulgur, and whole grain cereals. Low-sodium cereals. Corn or whole wheat flour tortillas. Whole grain cornbread. Whole grain crackers. Low-sodium crackers. Vegetables Fresh or frozen vegetables (raw, steamed, roasted, or grilled). Low-sodium or reduced-sodium tomato and vegetable juices. Low-sodium or reduced-sodium tomato sauce and paste. Low-sodium or reduced-sodium canned vegetables.  Fruits All fresh, canned (in natural juice), or frozen fruits. Meat and Other Protein Products Ground beef (85% or leaner), grass-fed beef, or beef trimmed of fat. Skinless chicken or [place]. Ground chicken or [place]. Pork trimmed of fat. All fish and seafood. Eggs. Dried beans, peas, or lentils. Unsalted nuts and seeds. Unsalted canned beans. Dairy Low-fat dairy products, such as skim or 1% milk, 2% or reduced-fat cheeses, low-fat ricotta or cottage cheese, or plain low-fat yogurt.  Low-sodium or reduced-sodium cheeses. Fats and Oils Tub margarines without trans fats. Light or reduced-fat mayonnaise and salad dressings (reduced sodium). Avocado. Safflower, olive, or canola oils. Natural peanut or almond butter. Other Unsalted popcorn and pretzels. The items listed above may not be a complete list of recommended foods or beverages. Contact  your dietitian for more options. WHAT FOODS ARE NOT RECOMMENDED? Grains White bread. White pasta. White rice. Refined cornbread. Bagels and croissants. Crackers that contain trans fat. Vegetables Creamed or fried vegetables. Vegetables in a cheese sauce. Regular canned vegetables. Regular canned tomato sauce and paste. Regular tomato and vegetable juices. Fruits Dried fruits. Canned fruit in light or heavy syrup. Fruit juice. Meat and Other Protein Products Fatty cuts of meat. Ribs, chicken wings, bacon, sausage, bologna, salami, chitterlings, fatback, hot dogs, bratwurst, and packaged luncheon meats. Salted nuts and seeds. Canned beans with salt. Dairy Whole or 2% milk, cream, half-and-half, and cream cheese. Whole-fat or sweetened yogurt. Full-fat cheeses or blue cheese. Nondairy creamers and whipped toppings. Processed cheese, cheese spreads, or cheese curds. Condiments Onion and garlic salt, seasoned salt, table salt, and sea salt. Canned and packaged gravies. Worcestershire sauce. Tartar sauce. Barbecue sauce. Teriyaki sauce. Soy sauce, including reduced sodium. Steak sauce. Fish sauce. Oyster sauce. Cocktail sauce. Horseradish. Ketchup and mustard. Meat flavorings and tenderizers. Bouillon cubes. Hot sauce. Tabasco sauce. Marinades. Taco seasonings. Relishes. Fats and Oils Butter, stick margarine, lard, shortening, ghee, and bacon fat. Coconut, palm kernel, or palm oils. Regular salad dressings. Other Pickles and olives. Salted popcorn and pretzels. The items listed above may not be a complete list of foods and beverages to avoid. Contact your dietitian for more information. WHERE CAN I FIND MORE INFORMATION? National Heart, [employer], and [employer] Institute: [employer]   This information is not intended to replace advice given to you by your health care provider. Make sure you discuss any questions you have with your health care provider.   Document  Released: 07/17/2011 Document Revised: 08/18/2014 Document Reviewed: 06/01/2013 Elsevier Interactive Patient Education [employer]

## 2015-08-08 NOTE — Progress Notes (Signed)
BP 157/89 mmHg  Pulse 80  Temp(Src) 98.1 F (36.7 C)  Wt 164 lb (74.39 kg)  SpO2 97%   Subjective:    Patient ID: Amanda Lyons, female    DOB: [birth year], 57 y.o.   MRN: [id number]  HPI: Amanda Lyons is a 59 y.o. female  Chief Complaint  Patient presents with  . Follow-up    "I'm not sure what we are following up on today"  . Hypertension  . Hyperlipidemia  . Hyperglycemia  . Referral    she will call and schedule her mammogram and eye exam   She goes back to see pulm January 13th; no changes to plan, no changes to diagnosis per patient; he is going to get another CXR when she goes back  High blood pressure; she has not checked BP away from doctor later; not eating much salt, not a whole lot; the med for BP was changed by pulm; now on losartan/hctz and amlodipine 5  High cholesterol; staying away from fatty foods; last lipids October reviewed and wonderful  Diabetes; no numbness in the feet; does need meter for sugars; checking every other day; last sugar was 124; no really low sugars; just started last Saturday; she has not gone to see diabetic educator Last A1c was 6.6 October 19th  No vaginal bleeding; just that one time; not sure if truly from vagina; it was just a little bit and quit; dry down there; no pelvic pain; no bloating; no rectal bleeding; no dysuria; s/p hysterectomy; sounds like atrophic vaginitis; she is not concerned at all; she has had a hysterectomy per chart  Relevant past medical, surgical, family and social history reviewed and updated as indicated. Interim medical history since our last visit reviewed. Allergies and medications reviewed and updated.  Review of Systems Per HPI unless specifically indicated above     Objective:    BP 157/89 mmHg  Pulse 80  Temp(Src) 98.1 F (36.7 C)  Wt 164 lb (74.39 kg)  SpO2 97%  Wt Readings from Last 3 Encounters:  08/08/15 164 lb (74.39 kg)  07/27/15 164 lb 6.4 oz (74.571 kg)  07/25/15 164 lb (74.39  kg)    Physical Exam  Constitutional: She appears well-developed and well-nourished.  HENT:  Mouth/Throat: No oropharyngeal exudate.  Eyes: EOM are normal. No scleral icterus.  Cardiovascular: Normal rate and regular rhythm.   Pulmonary/Chest: Effort normal and breath sounds normal.  Abdominal: She exhibits no distension.  Musculoskeletal: She exhibits no edema.  Neurological: She is alert.  Skin: No pallor.  Psychiatric: She has a normal mood and affect.   Diabetic Foot Form - Detailed   Diabetic Foot Exam - detailed  Diabetic Foot exam was performed with the following findings:  Yes 08/08/2015  8:20 AM  Visual Foot Exam completed.:  Yes  Is there a history of foot ulcer?:  No  Are the toenails long?:  No    Pulse Foot Exam completed.:  Yes  Right Dorsalis Pedis:  Present Left Dorsalis Pedis:  Present  Sensory Foot Exam Completed.:  Yes  Semmes-Weinstein Monofilament Test  R Site 1-Great Toe:  Pos L Site 1-Great Toe:  Pos  R Site 4:  Pos L Site 4:  Pos  R Site 5:  Pos L Site 5:  Pos    Comments:  Great and 2nd toes on the left are normal size, 3rd, 4th, and 5th on left are smaller; 4th on right is small and rides back and high  Results for orders placed or performed in visit on 05/30/15  Microscopic Examination  Result Value Ref Range   WBC, UA 0-5 0 -  5 /hpf   RBC, UA 0-2 0 -  2 /hpf   Epithelial Cells (non renal) 0-10 0 - 10 /hpf   Bacteria, UA Few None seen/Few  Urinalysis, Routine w reflex microscopic (not at [facility])  Result Value Ref Range   Specific Gravity, UA 1.020 1.005 - 1.030   pH, UA 6.5 5.0 - 7.5   Color, UA Yellow Yellow   Appearance Ur Clear Clear   Leukocytes, UA Trace (A) Negative   Protein, UA Negative Negative/Trace   Glucose, UA Negative Negative   Ketones, UA Negative Negative   RBC, UA Negative Negative   Bilirubin, UA Negative Negative   Urobilinogen, Ur 0.2 0.2 - 1.0 mg/dL   Nitrite, UA Negative Negative   Microscopic Examination See  below:   Vit D  25 hydroxy (rtn osteoporosis monitoring)  Result Value Ref Range   Vit D, 25-Hydroxy 28.1 (L) 30.0 - 100.0 ng/mL  Lipid Panel w/o Chol/HDL Ratio  Result Value Ref Range   Cholesterol, Total 163 100 - 199 mg/dL   Triglycerides 440 0 - 149 mg/dL   HDL 68 >10 mg/dL   VLDL Cholesterol Cal 22 5 - 40 mg/dL   LDL Calculated 73 0 - 99 mg/dL  Comprehensive metabolic panel  Result Value Ref Range   Glucose 112 (H) 65 - 99 mg/dL   BUN 23 6 - 24 mg/dL   Creatinine, Ser 2.72 0.57 - 1.00 mg/dL   GFR calc non Af Amer 97 >59 mL/min/1.73   GFR calc Af Amer 112 >59 mL/min/1.73   BUN/Creatinine Ratio 33 (H) 9 - 23   Sodium 140 136 - 144 mmol/L   Potassium 4.0 3.5 - 5.2 mmol/L   Chloride 100 97 - 106 mmol/L   CO2 25 18 - 29 mmol/L   Calcium 10.2 8.7 - 10.2 mg/dL   Total Protein 6.7 6.0 - 8.5 g/dL   Albumin 4.2 3.5 - 5.5 g/dL   Globulin, Total 2.5 1.5 - 4.5 g/dL   Albumin/Globulin Ratio 1.7 1.1 - 2.5   Bilirubin Total 0.3 0.0 - 1.2 mg/dL   Alkaline Phosphatase 119 (H) 39 - 117 IU/L   AST 19 0 - 40 IU/L   ALT 26 0 - 32 IU/L  Hgb A1c w/o eAG  Result Value Ref Range   Hgb A1c MFr Bld 6.6 (H) 4.8 - 5.6 %      Assessment & Plan:   Problem List Items Addressed This Visit      Cardiovascular and Mediastinum   Hypertension    Increase CCB from 5 mg to 10 mg daily; DASH guidelines encouraged      Relevant Medications   amLODipine (NORVASC) 10 MG tablet     Respiratory   Chronic bronchitis (HCC)    Managed by pulmonologist; grateful for his assistance and care        Endocrine   Type 2 diabetes mellitus (HCC) - Primary    Refer to diabetic educator; foot exam done, recommended lamsil; patient will see eye doctor; pt to check feet nightly; next a1c due April 19th or just after      Relevant Orders   Ambulatory referral to diabetic education     Other   Dyslipidemia (Chronic)    Continue statin; reviewed last lipids; discussed healthy eating, decrease saturated fats;  check lipids and SGPT with next  set of labs      Vaginal spotting    Patient reports a single episode, completely resolved; s/p hysterectomy; she is not worried and considers this a completed issue; no further work-up         Follow up plan: Return in about 4 months (around 11/28/2015) for thirty minute follow-up with fasting labs.   Orders Placed This Encounter  Procedures  . Ambulatory referral to diabetic education   Meds ordered this encounter  Medications  . DISCONTD: glucose blood test strip    Sig: Use as instructed daily; LON 6 months; E11.9    Dispense:  100 each    Refill:  1  . amLODipine (NORVASC) 10 MG tablet    Sig: Take 1 tablet (10 mg total) by mouth daily.    Dispense:  90 tablet    Refill:  1   Medications Discontinued During This Encounter  Medication Reason  . clobetasol cream (TEMOVATE) 0.05 % Completed Course  . amLODipine (NORVASC) 5 MG tablet Dose change   An after-visit summary was printed and given to the patient at check-out.  Please see the patient instructions which may contain other information and recommendations beyond what is mentioned above in the assessment and plan.

## 2015-08-08 NOTE — Telephone Encounter (Signed)
Thank you, Rx sent

## 2015-08-08 NOTE — Telephone Encounter (Signed)
Dr. Sherie DonLada, they pharmacy sent a fax stating that her insurance will not cover a rx for generic glucose test strips. I spoke with the patient, she said she uses Contour test strips. I updated her med list with the correct test strips, can you send in the rx for it please?

## 2015-08-08 NOTE — Assessment & Plan Note (Signed)
Refer to diabetic educator; foot exam done, recommended lamsil; patient will see eye doctor; pt to check feet nightly; next a1c due April 19th or just after

## 2015-08-11 NOTE — Assessment & Plan Note (Signed)
Managed by pulmonologist; grateful for his assistance and care

## 2015-08-11 NOTE — Assessment & Plan Note (Signed)
Increase CCB from 5 mg to 10 mg daily; DASH guidelines encouraged

## 2015-08-11 NOTE — Assessment & Plan Note (Signed)
Continue statin; reviewed last lipids; discussed healthy eating, decrease saturated fats; check lipids and SGPT with next set of labs

## 2015-08-11 NOTE — Assessment & Plan Note (Signed)
Patient reports a single episode, completely resolved; s/p hysterectomy; she is not worried and considers this a completed issue; no further work-up

## 2015-08-27 ENCOUNTER — Ambulatory Visit (INDEPENDENT_AMBULATORY_CARE_PROVIDER_SITE_OTHER): Payer: No Typology Code available for payment source | Admitting: Pulmonary Disease

## 2015-08-27 ENCOUNTER — Encounter: Payer: Self-pay | Admitting: Pulmonary Disease

## 2015-08-27 VITALS — BP 130/82 | HR 71 | Ht 62.0 in | Wt 167.0 lb

## 2015-08-27 DIAGNOSIS — R05 Cough: Secondary | ICD-10-CM

## 2015-08-27 DIAGNOSIS — J45909 Unspecified asthma, uncomplicated: Secondary | ICD-10-CM | POA: Diagnosis not present

## 2015-08-27 DIAGNOSIS — J329 Chronic sinusitis, unspecified: Secondary | ICD-10-CM

## 2015-08-27 DIAGNOSIS — R059 Cough, unspecified: Secondary | ICD-10-CM

## 2015-08-27 MED ORDER — FLUTICASONE FUROATE-VILANTEROL 100-25 MCG/INH IN AEPB: 1.0000 | INHALATION_SPRAY | Freq: Every day | RESPIRATORY_TRACT | Status: AC

## 2015-08-27 MED ORDER — FLUTICASONE FUROATE-VILANTEROL 100-25 MCG/INH IN AEPB: 1.0000 | INHALATION_SPRAY | Freq: Every day | RESPIRATORY_TRACT | Status: DC

## 2015-08-27 NOTE — Progress Notes (Signed)
PULMONARY OFFICE F/U NOTE  Requesting MD/Service: Baruch GoutyMelinda Lada Date of initial consultation: 07/13/15 Reason for consultation: severe intractable cough  Initial HPI 07/14/15:,  59 F in her USOH until approx 5 weeks ago when she developed symptoms of a URI and was diagnosed with sinusitis, otitis and bronchitis. She was treated with amoxicillin and a cough suppressant with minimal benefit. She has been plagued with intractable vigorous rattling cough since that time that has also failed to respond to Tessalon. She underwent a CXR which was interpreted as abnormal and a follow up CT chest was performed. The results are discussed below. She is now referred for this cough. She denies fever, chills, sweats, pleuritic or anginal CP, hemoptysis, LE edema and calf tenderness. OF note, she is on an ACEI. This medication was started 3-4 yrs ago and has been well tolerated. 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 She has no identifiable sick contacts. There is no history of TB or positive skin test.   She was started on levofloxacin and prednisone which were first taken on the day of this evaluation  ROV 07/19/15: Minimal to no improvement. Severe rhinitis noted on exam. Nasal steroid initiated. Recommended over the counter cold and sinus formula. To complete levofloxacin and prednisone as previously prescribed. To cont PPI ROV 07/27/15: Minimal improvement. Still coughing vigorously in exam room. Tussionex refilled. Trial of Breo inhaler (sample provided and Rx sent) ROV 08/27/15: Breo helped approx 80% improvement but she couldn't afford co-payment and stopped taking after sample was completed. Also new complaint of occasional dyspnea. Cough and dyspnea worse with exposure to cold air  SUBJ: As above  Filed Vitals:   08/27/15 0843  BP: 130/82  Pulse: 71  Height: 5\' 2"  (1.575 m)  Weight: 167 lb (75.751 kg)  SpO2: 97%    EXAM:  Gen: coughing intermittently  during encounter HEENT: moderate nasal mucosal erythema, mucoid secretoins in L naris, TCs and TMs normal Neck: NO LAN, no JVD noted Lungs: sl coarse, no wheezes Cardiovascular: Reg, no M noted Abdomen: Soft, NT +BS Ext: no C/C/E   DATA:  BMP Latest Ref Rng 05/30/2015 02/06/2015 10/09/2014  Glucose 65 - 99 mg/dL 952(W112(H) 413(K112(H) 440(N135(H)  BUN 6 - 24 mg/dL 23 21 17   Creatinine 0.57 - 1.00 mg/dL 0.270.69 2.530.66 6.640.75  BUN/Creat Ratio 9 - 23 33(H) 32(H) -  Sodium 136 - 144 mmol/L 140 139 138  Potassium 3.5 - 5.2 mmol/L 4.0 4.4 3.8  Chloride 97 - 106 mmol/L 100 99 103  CO2 18 - 29 mmol/L 25 26 28   Calcium 8.7 - 10.2 mg/dL 40.310.2 9.8 47.410.3   CBC Latest Ref Rng 10/09/2014 08/23/2014 11/14/2013  WBC 4.0 - 10.5 K/uL 8.0 7.3 6.6  Hemoglobin 12.0 - 15.0 g/dL 25.913.5 56.313.4 87.513.6  Hematocrit 36.0 - 46.0 % 40.0 41.0 40.7  Platelets 150.0 - 400.0 K/uL 352.0 331 341.0    NO new labs or CXR  IMPRESSION:   1) Chronic cough - onset of illness was likely infectious (viral) and CT chest 11/30 c/w bronchiolitis. Currently her history is suggestive of an asthmatic syndrome - either post-infectious phenomenon or unmasked asthma 2) Chronic rhinosinusitis - controlled    PLAN:  Cont Breo - sample provided and discount coupon provided ROV 4-6 wks with full PFTs   Billy Fischeravid Fynley Chrystal, MD PCCM service Mobile [phone] Pager [phone]

## 2015-08-27 NOTE — Addendum Note (Signed)
Addended by: Meyer CoryAHMAD, MISTY R on: 08/27/2015 09:14 AM   Modules accepted: Orders

## 2015-08-31 ENCOUNTER — Other Ambulatory Visit: Payer: Self-pay | Admitting: Family Medicine

## 2015-08-31 NOTE — Telephone Encounter (Signed)
May 30, 2015 creatinine and A1c reviewed; Rx approved

## 2015-09-04 ENCOUNTER — Other Ambulatory Visit: Payer: Self-pay | Admitting: Family Medicine

## 2015-09-04 NOTE — Telephone Encounter (Signed)
October SGPT and lipids reviewed; Rx approved

## 2015-10-01 ENCOUNTER — Ambulatory Visit (INDEPENDENT_AMBULATORY_CARE_PROVIDER_SITE_OTHER): Payer: No Typology Code available for payment source | Admitting: *Deleted

## 2015-10-01 DIAGNOSIS — R05 Cough: Secondary | ICD-10-CM | POA: Diagnosis not present

## 2015-10-01 DIAGNOSIS — R059 Cough, unspecified: Secondary | ICD-10-CM

## 2015-10-01 DIAGNOSIS — J45909 Unspecified asthma, uncomplicated: Secondary | ICD-10-CM

## 2015-10-01 LAB — PULMONARY FUNCTION TEST
DL/VA % pred: 116 %
DL/VA: 5.27 ml/min/mmHg/L
DLCO unc % pred: 155 %
DLCO unc: 33.46 ml/min/mmHg
FEF 25-75 PRE: 1.11 L/s
FEF 25-75 Post: 1.84 L/sec
FEF2575-%Change-Post: 64 %
FEF2575-%PRED-PRE: 48 %
FEF2575-%Pred-Post: 79 %
FEV1-%Change-Post: 29 %
FEV1-%PRED-POST: 73 %
FEV1-%Pred-Pre: 56 %
FEV1-PRE: 1.37 L
FEV1-Post: 1.78 L
FEV1FVC-%CHANGE-POST: -7 %
FEV1FVC-%Pred-Pre: 95 %
FEV6-%CHANGE-POST: 39 %
FEV6-%PRED-POST: 85 %
FEV6-%PRED-PRE: 61 %
FEV6-POST: 2.59 L
FEV6-PRE: 1.85 L
FEV6FVC-%PRED-POST: 104 %
FEV6FVC-%PRED-PRE: 104 %
FVC-%Change-Post: 39 %
FVC-%Pred-Post: 82 %
FVC-%Pred-Pre: 59 %
FVC-PRE: 1.85 L
FVC-Post: 2.59 L
POST FEV6/FVC RATIO: 100 %
Post FEV1/FVC ratio: 69 %
Pre FEV1/FVC ratio: 74 %
Pre FEV6/FVC Ratio: 100 %
RV % pred: 93 %
RV: 1.73 L
TLC % PRED: 88 %
TLC: 4.22 L

## 2015-10-01 NOTE — Progress Notes (Signed)
PFT performed today with Nitrogen washout. 

## 2015-10-02 ENCOUNTER — Other Ambulatory Visit: Payer: Self-pay | Admitting: *Deleted

## 2015-10-02 MED ORDER — LOSARTAN POTASSIUM-HCTZ 100-12.5 MG PO TABS: 1.0000 | ORAL_TABLET | Freq: Every day | ORAL | Status: DC

## 2015-10-02 NOTE — Telephone Encounter (Signed)
Sent 90 day supply for Losartan-HCTZ per pt request.

## 2015-10-03 ENCOUNTER — Ambulatory Visit (INDEPENDENT_AMBULATORY_CARE_PROVIDER_SITE_OTHER): Payer: No Typology Code available for payment source | Admitting: Pulmonary Disease

## 2015-10-03 ENCOUNTER — Encounter: Payer: Self-pay | Admitting: Pulmonary Disease

## 2015-10-03 VITALS — BP 130/80 | HR 78 | Ht 62.0 in | Wt 163.0 lb

## 2015-10-03 DIAGNOSIS — R05 Cough: Secondary | ICD-10-CM | POA: Diagnosis not present

## 2015-10-03 DIAGNOSIS — R059 Cough, unspecified: Secondary | ICD-10-CM

## 2015-10-03 DIAGNOSIS — J4540 Moderate persistent asthma, uncomplicated: Secondary | ICD-10-CM | POA: Diagnosis not present

## 2015-10-03 DIAGNOSIS — R0600 Dyspnea, unspecified: Secondary | ICD-10-CM

## 2015-10-03 MED ORDER — PREDNISONE 20 MG PO TABS: 40.0000 mg | ORAL_TABLET | Freq: Every day | ORAL | Status: AC

## 2015-10-03 MED ORDER — ALBUTEROL SULFATE HFA 108 (90 BASE) MCG/ACT IN AERS: 2.0000 | INHALATION_SPRAY | Freq: Four times a day (QID) | RESPIRATORY_TRACT | Status: DC | PRN

## 2015-10-03 NOTE — Patient Instructions (Signed)
Prednisone 40 mg (2 tabs) daily for 5 days, then stop Continue Breo as previously ordered Albuterol inhaler - 2 puffs as needed for shortness of breath or severe cough. May use up to 4 times per day Follow up in 4-6 weeks. At that time we will consider adding another asthma controller medication if you are still having trouble with shortness of breath

## 2015-10-04 NOTE — Progress Notes (Signed)
PULMONARY OFFICE F/U NOTE  Requesting MD/Service: Baruch Gouty Date of initial consultation: 07/13/15 Reason for consultation: severe intractable cough  Initial HPI 07/14/15:,  59 F in her USOH until approx 5 weeks ago when she developed symptoms of a URI and was diagnosed with sinusitis, otitis and bronchitis. She was treated with amoxicillin and a cough suppressant with minimal benefit. She has been plagued with intractable vigorous rattling cough since that time that has also failed to respond to Tessalon. She underwent a CXR which was interpreted as abnormal and a follow up CT chest was performed. The results are discussed below. She is now referred for this cough. She denies fever, chills, sweats, pleuritic or anginal CP, hemoptysis, LE edema and calf tenderness. OF note, she is on an ACEI. This medication was started 3-4 yrs ago and has been well tolerated. 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 She has no identifiable sick contacts. There is no history of TB or positive skin test. She was started on levofloxacin and prednisone which were first taken on the day of initial evaluation IMPRESSION:  1) Severe cough 2) Acute bronchitis vs bronchiolitis, possibly infectious. Likely now that any infectious component has passed and we are dealing with a post-infectious phenomenon 3) Chronic ACEI therapy - although she has tolerated this medication well until recently, it is possible that it is now functioning as an amplifier of her cough and its continued use will make further evaluation over time difficult PLAN:  Complete Levaquin as prescribed Agree with Prednisone - adjusted dose today to 60 mg daily X 3, 40 mg X 3, 20 mg X 3, stop  Tussionex twice a day on schedule for next 3-4 days, then as needed after that Cont tessalon perles - use liberally even if it is hard to tell that they are doing much Stop benazapril - HCTZ (losentin) Hyzaar daily in place of  Losentin Follow up next week with me  ROV 07/19/15: Minimal to no improvement. Severe rhinitis noted on exam. Nasal steroid initiated. Recommended over the counter cold and sinus formula. To complete levofloxacin and prednisone as previously prescribed. To cont PPI ROV 07/27/15: Minimal improvement. Still coughing vigorously in exam room. Tussionex refilled. Trial of Breo inhaler (sample provided and Rx sent) ROV 08/27/15: Breo helped approx 80% improvement but she couldn't afford co-payment and stopped taking after sample was completed. Also new complaint of occasional dyspnea. Cough and dyspnea worse with exposure to cold air PFTs 09/30/25: poor quality test due to difficulty performing spirometry maneuver. Suggestive of obstruction with significant improvement after bronchodilator challenge ROV 10/03/15: increased DOE. Cough nearly resolved. Treated as asthma exacerbation with prednisone course, continue Breo. PRN albuterol ordered with instructions on its use  SUBJ: Reports increased SOB with exertion. There is little DTD variation since its onset a couple of weeks ago. Cough is markedly improved to resolved.   Filed Vitals:   10/03/15 0831  BP: 130/80  Pulse: 78  Height:  (1.575 m)  Weight: 163 lb (73.936 kg)  SpO2: 97%    EXAM:  Gen: NAD HEENT: moderate nasal mucosal erythema, TCs and TMs normal Neck: NO LAN, no JVD noted Lungs: sl coarse, no wheezes Cardiovascular: Reg, no M noted Abdomen: Soft, NT +BS Ext: no C/C/E   DATA:  BMP Latest Ref Rng 05/30/2015 02/06/2015 10/09/2014  Glucose 65 - 99 mg/dL 161(W) 960(A) 540(J)  BUN 6 - 24 mg/dL Creatinine 0.57 - 1.00 mg/dL 8.11 9.14  0.75  BUN/Creat Ratio 9 - 23 33(H) 32(H) -  Sodium 136 - 144 mmol/L 140 139 138  Potassium 3.5 - 5.2 mmol/L 4.0 4.4 3.8  Chloride 97 - 106 mmol/L 100 99 103  CO2 18 - 29 mmol/L Calcium 8.7 - 10.2 mg/dL 16.1 9.8 09.6   CBC Latest Ref Rng 10/09/2014 08/23/2014 11/14/2013  WBC  4.0 - 10.5 K/uL 8.0 7.3 6.6  Hemoglobin 12.0 - 15.0 g/dL 04.5 40.9 81.1  Hematocrit 36.0 - 46.0 % 40.0 41.0 40.7  Platelets 150.0 - 400.0 K/uL 352.0 331 341.0    NO new labs or CXR  IMPRESSION:   1) Chronic cough, resolved 2) Chronic rhinosinusitis - controlled 3) Probable reversible obstruction on PFTs (poor reproducibility on test) - suspect asthma 4) Acute increase in dyspnea - suspect asthma exacerbation   PLAN:  Prednisone 40 mg (2 tabs) daily for 5 days, then stop Continue Breo as previously ordered Albuterol inhaler - 2 puffs as needed for shortness of breath or severe cough. May use up to 4 times per day Follow up in 4-6 weeks. At that time we will consider adding another asthma controller medication if you are still having trouble with shortness of breath  Billy Fischer, MD PCCM service Mobile [phone] Pager [phone]

## 2015-10-26 ENCOUNTER — Encounter: Payer: Self-pay | Admitting: Family Medicine

## 2015-10-26 ENCOUNTER — Ambulatory Visit (INDEPENDENT_AMBULATORY_CARE_PROVIDER_SITE_OTHER): Payer: No Typology Code available for payment source | Admitting: Family Medicine

## 2015-10-26 VITALS — BP 132/75 | HR 68 | Temp 98.1°F | Ht 64.0 in | Wt 163.0 lb

## 2015-10-26 DIAGNOSIS — G8929 Other chronic pain: Secondary | ICD-10-CM | POA: Insufficient documentation

## 2015-10-26 DIAGNOSIS — R109 Unspecified abdominal pain: Secondary | ICD-10-CM

## 2015-10-26 DIAGNOSIS — M25551 Pain in right hip: Secondary | ICD-10-CM

## 2015-10-26 DIAGNOSIS — R1031 Right lower quadrant pain: Secondary | ICD-10-CM

## 2015-10-26 MED ORDER — MELOXICAM 15 MG PO TABS: 15.0000 mg | ORAL_TABLET | Freq: Every day | ORAL | Status: DC

## 2015-10-26 NOTE — Progress Notes (Signed)
BP 132/75 mmHg  Pulse 68  Temp(Src) 98.1 F (36.7 C)  Ht  (1.626 m)  Wt 163 lb (73.936 kg)  BMI 27.97 kg/m2  SpO2 98%   Subjective:    Patient ID: Amanda Lyons, female    DOB: 1955/10/17, 60 y.o.   MRN: [id number]  HPI: Amanda Lyons is a 60 y.o. female  Chief Complaint  Patient presents with  . Groin Pain    right side, hurts from hip and down into leg  . Orders    patient to call and schedule her eye exam and mammo   She has pain in the right groin, down to the knee; limping and can't sleep at night; by the end of the night at work, she has to hold on to something on the sides to ease herself into sitting position; no injury; nothing like this before; she cannot lay on the right hip; no hx of arthritis; not sore to the touch, then realized it is sore to the touch over the right greater trochanter; can't lay on the right; no rash like shingles; no dysuria; leg does not go cold or blue; a little pain in the back; had back surgery and has always hurt a little from that; last surgery was two years ago with ruptured disc; no change in the back pain 4 weeks duration 8 out of 10 pain; has had to take percocet at times it is so bad; also tylenol for pain; has not tried any NSAIDs Ankles were swollen one night, both ankles equally; no redness or pain in the leg No injury recalled Allergic to prednisone, high dosage makes her nausea and sick; no rash; tolerates NSAIDs just fine  She has been seeing pulmonologist; had breathing tests in a "cylinder thing" and she doesn't think she did very well; he is leaning towards asthma; using Breo daily and rescue inhaler PRN; did not have asthma as a child; no environmental changes  Relevant past medical, surgical history reviewed Interim medical history since our last visit reviewed; seeing pulmonologist Allergies and medications reviewed  Review of Systems  Per HPI unless specifically indicated above     Objective:    BP 132/75 mmHg   Pulse 68  Temp(Src) 98.1 F (36.7 C)  Ht  (1.626 m)  Wt 163 lb (73.936 kg)  BMI 27.97 kg/m2  SpO2 98%  Wt Readings from Last 3 Encounters:  10/26/15 163 lb (73.936 kg)  10/03/15 163 lb (73.936 kg)  08/27/15 167 lb (75.751 kg)    Physical Exam  Constitutional: She appears well-developed and well-nourished.  Cardiovascular: Normal rate.   Musculoskeletal: She exhibits no edema.       Right hip: She exhibits decreased range of motion and tenderness. She exhibits no bony tenderness, no swelling, no crepitus and no deformity.  Limited ROM and pain with external rotation > internal rotation; flexion at the hip does not reproduce pain; extension of the leg behind her at the waist reproduces pain  Lymphadenopathy:       Right: No inguinal adenopathy present.  Skin: No lesion noted.  No skin changes suggestive of shingles in the area; no erythema of the legs or calves  Psychiatric: She has a normal mood and affect.      Assessment & Plan:   Problem List Items Addressed This Visit      Other   Chronic right hip pain - Primary    Going on for 4 weeks; suspect musculoskeletal etiology; start  meloxicam; refer to orthopaedist; gentle stretching, turmeric, tylenol, topical ice (with protective cloth between ice and skin)      Relevant Orders   Ambulatory referral to Orthopedic Surgery   Groin pain, chronic, right    Going on for 4 weeks; suspect musculoskeletal etiology; start meloxicam; refer to orthopaedist; gentle stretching, turmeric, tylenol, topical ice (with protective cloth between ice and skin)      Relevant Medications   meloxicam (MOBIC) 15 MG tablet   Other Relevant Orders   Ambulatory referral to Orthopedic Surgery       Follow up plan: No Follow-up on file. [facility] fasting labs and visit for chronic health issues  Meds ordered this encounter  Medications  . meloxicam (MOBIC) 15 MG tablet    Sig: Take 1 tablet (15 mg total) by  mouth daily. Take aspirin at least one hour PRIOR to this medicine; take with food or milk    Dispense:  30 tablet    Refill:  0   An after-visit summary was printed and given to the patient at check-out.  Please see the patient instructions which may contain other information and recommendations beyond what is mentioned above in the assessment and plan.

## 2015-10-26 NOTE — Assessment & Plan Note (Signed)
Going on for 4 weeks; suspect musculoskeletal etiology; start meloxicam; refer to orthopaedist; gentle stretching, turmeric, tylenol, topical ice (with protective cloth between ice and skin) 

## 2015-10-26 NOTE — Patient Instructions (Addendum)
Start meloxicam Take your aspirin at least one hour PRIOR to the meloxicam Do not take any other NSAIDs with this meloxicam, but it's okay to take tylenol Try turmeric as a natural anti-inflammatory (for pain and arthritis). It comes in capsules where you buy aspirin and fish oil, but also as a spice where you buy pepper and garlic powder. We'll have you see the orthopaedist and they can do any xrays they feel are needed If you have not heard anything from my staff in a week about any orders/referrals/studies from today, please contact us here to follow-up [phone] Gentle stretching of the right leg and hip, but don't do anything that causes pain [facility] Try ice topically for twenty minutes at a time for 3-4 times a day; always have thin cloth to protect your skin for thermal injury

## 2015-10-26 NOTE — Assessment & Plan Note (Signed)
Going on for 4 weeks; suspect musculoskeletal etiology; start meloxicam; refer to orthopaedist; gentle stretching, turmeric, tylenol, topical ice (with protective cloth between ice and skin)

## 2015-11-11 ENCOUNTER — Other Ambulatory Visit: Payer: Self-pay | Admitting: Cardiology

## 2015-11-12 DIAGNOSIS — M7070 Other bursitis of hip, unspecified hip: Secondary | ICD-10-CM | POA: Insufficient documentation

## 2015-11-13 ENCOUNTER — Ambulatory Visit (INDEPENDENT_AMBULATORY_CARE_PROVIDER_SITE_OTHER): Payer: No Typology Code available for payment source | Admitting: Pulmonary Disease

## 2015-11-13 ENCOUNTER — Encounter: Payer: Self-pay | Admitting: Pulmonary Disease

## 2015-11-13 VITALS — BP 132/64 | HR 74 | Ht 62.0 in | Wt 162.6 lb

## 2015-11-13 DIAGNOSIS — J4540 Moderate persistent asthma, uncomplicated: Secondary | ICD-10-CM

## 2015-11-13 NOTE — Progress Notes (Signed)
PULMONARY OFFICE F/U NOTE  Requesting MD/Service: Baruch GoutyMelinda Lada Date of initial consultation: 07/13/15 Reason for consultation: severe intractable cough  Initial HPI 07/14/15:  [age] F in her USOH until approx 5 weeks ago when she developed symptoms of a URI and was diagnosed with sinusitis, otitis and bronchitis. She was treated with amoxicillin and a cough suppressant with minimal benefit. She has been plagued with intractable vigorous rattling cough since that time that has also failed to respond to Tessalon. She underwent a CXR which was interpreted as abnormal and a follow up CT chest was performed. The results are discussed below. She is now referred for this cough. She denies fever, chills, sweats, pleuritic or anginal CP, hemoptysis, LE edema and calf tenderness. OF note, she was on an ACEI.  Initial impression: 1) Severe cough, 2) Acute bronchitis vs bronchiolitis, 3) Chronic ACEI therapy. PLAN: Levaquin, prednisone, Tussionex, Cont tessalon perles, Stop benazapril - HCTZ (losentin), Hyzaar daily in place of Losentin  ROV 07/19/15: Minimal to no improvement. Severe rhinitis noted on exam. Nasal steroid initiated. Recommended over the counter cold and sinus formula. To complete levofloxacin and prednisone as previously prescribed. To cont PPI ROV 07/27/15: Minimal improvement. Still coughing vigorously in exam room. Tussionex refilled. Trial of Breo inhaler (sample provided and Rx sent) ROV 08/27/15: Breo helped approx 80% improvement but she couldn't afford co-payment and stopped taking after sample was completed. Also new complaint of occasional dyspnea. Cough and dyspnea worse with exposure to cold air PFTs 09/30/25: poor quality test due to difficulty performing spirometry maneuver. Suggestive of obstruction with significant improvement after bronchodilator challenge ROV 10/03/15: increased DOE. Cough nearly resolved. Treated as asthma exacerbation with prednisone course, continue Breo. PRN  albuterol ordered with instructions on its use ROV 11/13/15: much improved. Cough fully resolved. No new complaints. Continue Breo and PRN albuterol. F/U in 3-4 months  SUBJ: Cough resolved. SOB much improved. Overall at least 80% back to her baseline of 7-8 months ago. Satisfied with where things are presently   Filed Vitals:   11/13/15 0833  BP: 132/64  Pulse: 74  Height: 5\' 2"  (1.575 m)  Weight: 162 lb 9.6 oz (73.755 kg)  SpO2: 91%    EXAM:  Gen: NAD HEENT: WNL  Neck: NO LAN, no JVD noted Lungs: full BS, no wheezes Cardiovascular: Reg, no M noted Abdomen: Soft, NT +BS Ext: no C/C/E   DATA:  BMP Latest Ref Rng 05/30/2015 02/06/2015 10/09/2014  Glucose 65 - 99 mg/dL 161(W112(H) 960(A112(H) 540(J135(H)  BUN 6 - 24 mg/dL 23 21 17   Creatinine 0.57 - 1.00 mg/dL 8.110.69 9.140.66 7.820.75  BUN/Creat Ratio 9 - 23 33(H) 32(H) -  Sodium 136 - 144 mmol/L 140 139 138  Potassium 3.5 - 5.2 mmol/L 4.0 4.4 3.8  Chloride 97 - 106 mmol/L 100 99 103  CO2 18 - 29 mmol/L 25 26 28   Calcium 8.7 - 10.2 mg/dL 95.610.2 9.8 21.310.3   CBC Latest Ref Rng 10/09/2014 08/23/2014 11/14/2013  WBC 4.0 - 10.5 K/uL 8.0 7.3 6.6  Hemoglobin 12.0 - 15.0 g/dL 08.613.5 57.813.4 46.913.6  Hematocrit 36.0 - 46.0 % 40.0 41.0 40.7  Platelets 150.0 - 400.0 K/uL 352.0 331 341.0    NO new labs or CXR  IMPRESSION:   1) Chronic cough, resolved 2) Chronic rhinosinusitis - controlled 3) Probable  Asthma -symptoms much improved  PLAN:  Continue Breo as previously ordered Cont PRN albuterol inhaler - 2 puffs as needed for shortness of breath or severe cough.  Follow up  in 3-4 months  Billy Fischer, MD PCCM service Mobile [phone] Pager [phone]

## 2015-12-03 ENCOUNTER — Other Ambulatory Visit: Payer: Self-pay

## 2015-12-03 MED ORDER — ATORVASTATIN CALCIUM 10 MG PO TABS: 10.0000 mg | ORAL_TABLET | Freq: Every day | ORAL | Status: DC

## 2015-12-03 MED ORDER — METFORMIN HCL ER 500 MG PO TB24
500.0000 mg | ORAL_TABLET | Freq: Every day | ORAL | Status: DC
Start: 2015-12-03 — End: 2015-12-04

## 2015-12-03 NOTE — Telephone Encounter (Signed)
Routing to provider, she is following you to [facility]. 

## 2015-12-03 NOTE — Telephone Encounter (Signed)
appt 12/07/15; will get fasting labs then; Rxs approved

## 2015-12-04 ENCOUNTER — Other Ambulatory Visit: Payer: Self-pay

## 2015-12-04 MED ORDER — METFORMIN HCL ER 500 MG PO TB24: 500.0000 mg | ORAL_TABLET | Freq: Every day | ORAL | Status: DC

## 2015-12-04 MED ORDER — ATORVASTATIN CALCIUM 10 MG PO TABS: 10.0000 mg | ORAL_TABLET | Freq: Every day | ORAL | Status: DC

## 2015-12-04 NOTE — Telephone Encounter (Signed)
New prescriptions written and sent; thank you

## 2015-12-04 NOTE — Telephone Encounter (Signed)
Pt needs 90 day supply per ins

## 2015-12-07 ENCOUNTER — Ambulatory Visit: Payer: No Typology Code available for payment source | Admitting: Family Medicine

## 2015-12-18 ENCOUNTER — Other Ambulatory Visit: Payer: Self-pay | Admitting: Cardiology

## 2015-12-22 IMAGING — CT CT CHEST HIGH RESOLUTION W/O CM
2 of 5 series · 15 of 36 positions shown, 18 images · non-contrast
Comparison: Chest radiograph 07/10/2015.

CLINICAL DATA: Cough for 5 weeks without improvement with
antibiotics.

EXAM:
CT CHEST WITHOUT CONTRAST
TECHNIQUE: Multidetector CT imaging of the chest was performed following the
standard protocol without intravenous contrast. High resolution
imaging of the lungs, as well as inspiratory and expiratory imaging,
was performed.

[Series 2: cap wo · axial · 0.67mm/px · z∈[-622,-366]mm · 12 of 59 slices shown, 15 images]
[im 4/59  mediastinal]
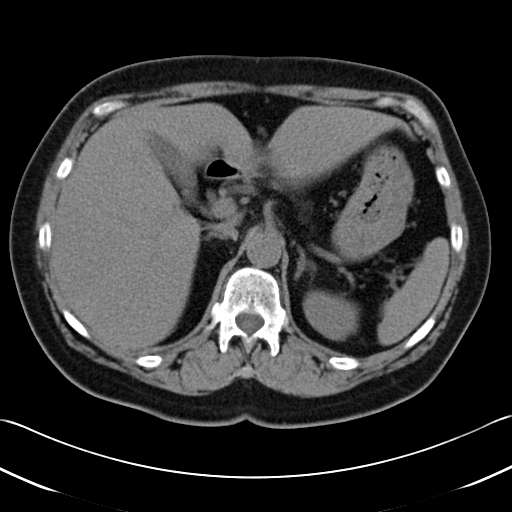
[im 4/59  lung]
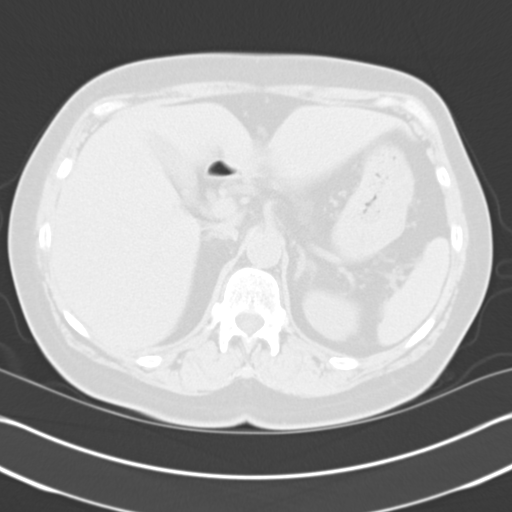
[im 10/59  lung]
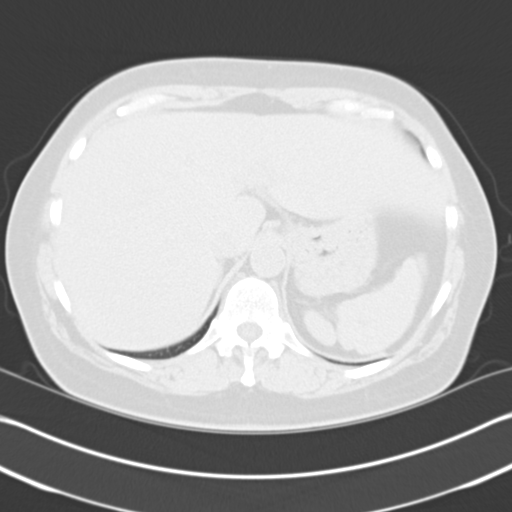
[im 13/59  lung]
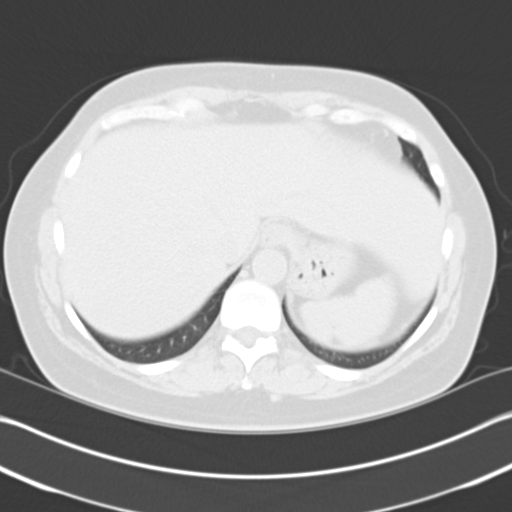
[im 19/59  lung]
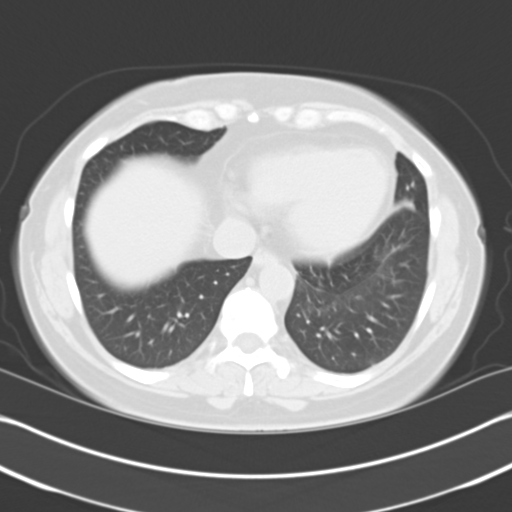
[im 22/59  mediastinal]
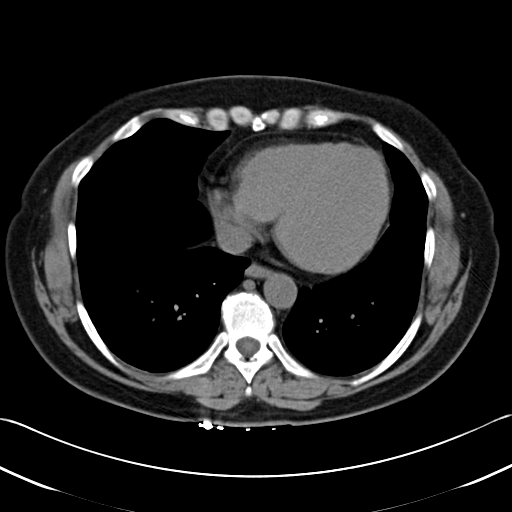
[im 22/59  lung]
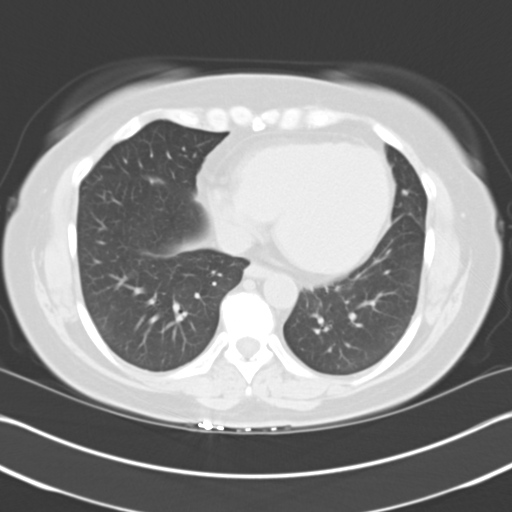
[im 28/59  lung]
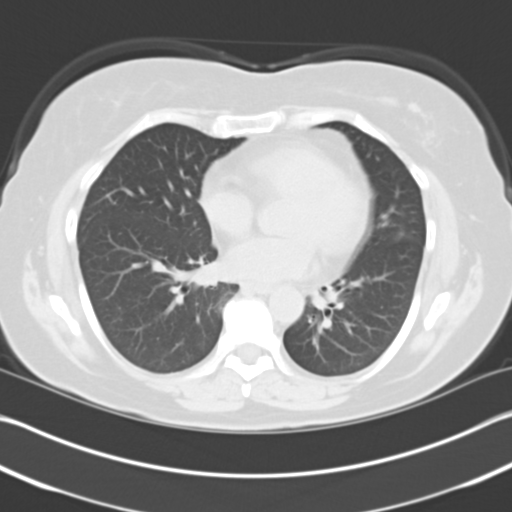
[im 31/59  lung]
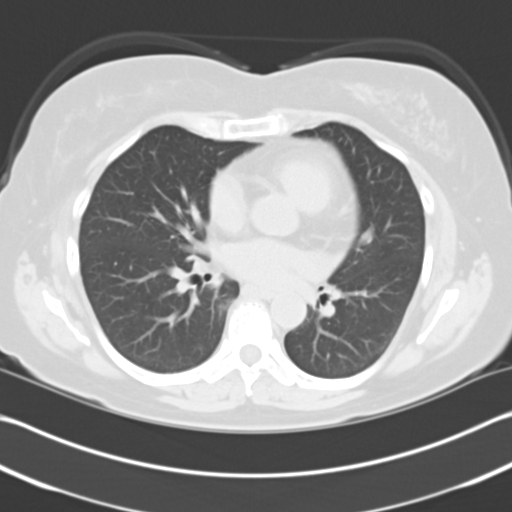
[im 37/59  lung]
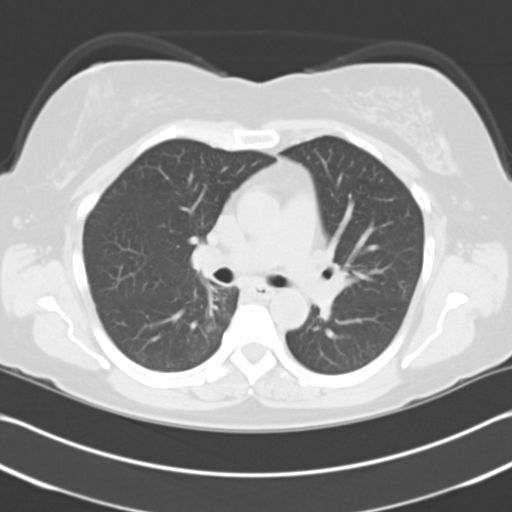
[im 40/59  mediastinal]
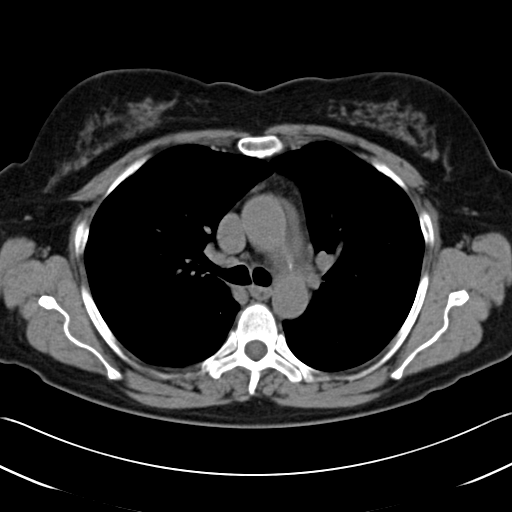
[im 40/59  lung]
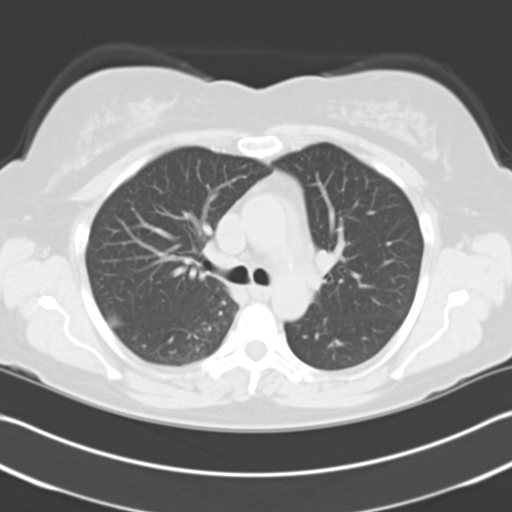
[im 46/59  lung]
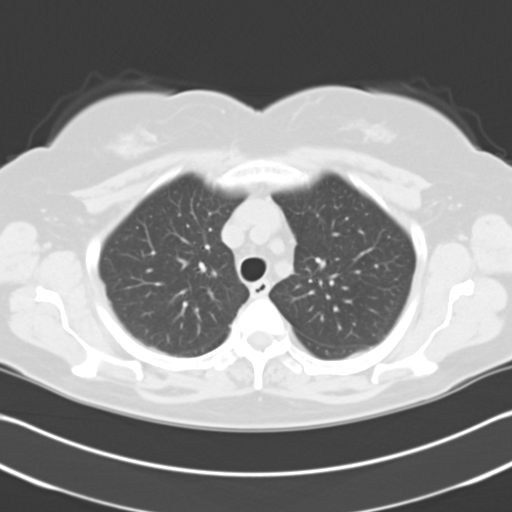
[im 49/59  lung]
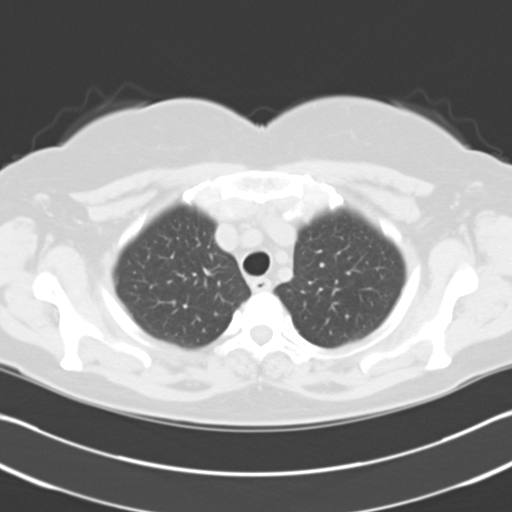
[im 55/59  lung]
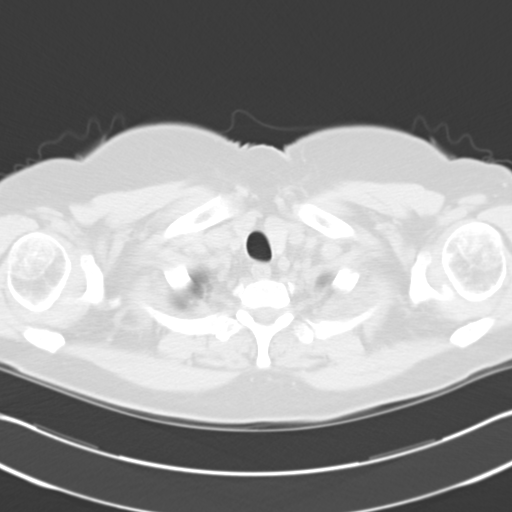

[Series 8: cor cap wo · coronal · 0.60mm/px · 3 of 121 slices shown]
[im 25/121  lung]
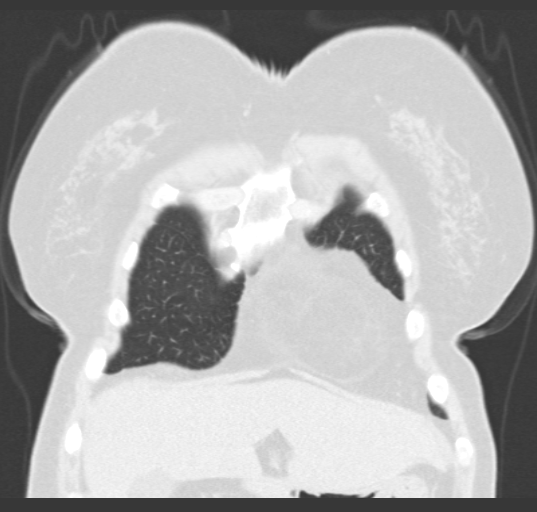
[im 49/121  lung]
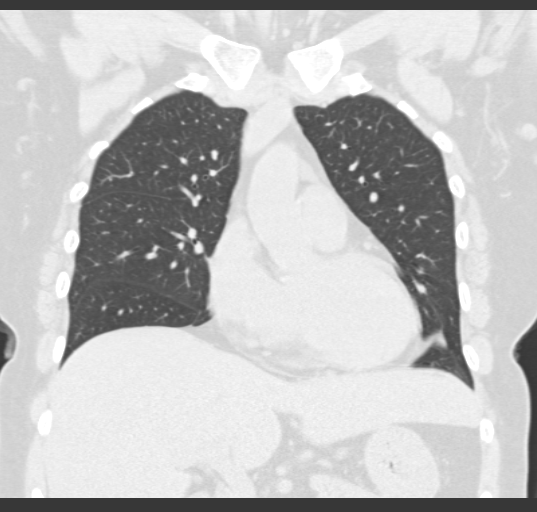
[im 73/121  lung]
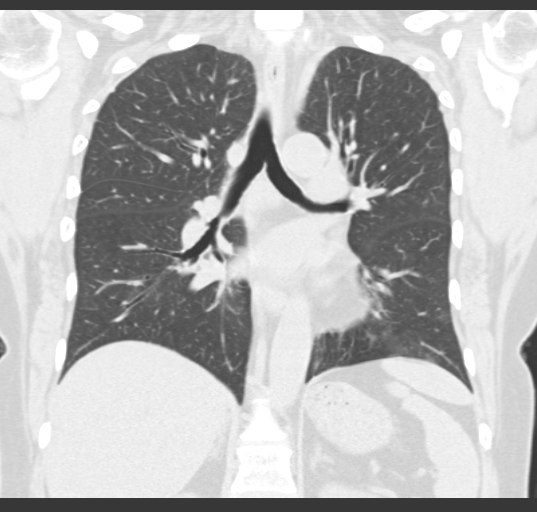

[15 of 36 positions shown; findings below may reference images not displayed]

FINDINGS: Mediastinum/Lymph Nodes: No pathologically enlarged mediastinal or
axillary lymph nodes. Hilar regions are difficult definitively
evaluate without IV contrast but appear grossly unremarkable. Heart
size normal. No pericardial effusion.

Lungs/Pleura: Minimal biapical pleural parenchymal scarring.
Ill-defined peribronchovascular nodularity in the right lower lobe
(series 7, image 22). No subpleural reticulation, traction
bronchiectasis/bronchiolectasis, ground-glass, architectural
distortion or honeycombing. No air trapping. No pleural fluid.
Airway is unremarkable.

Upper abdomen: Visualized portions of the liver, gallbladder,
adrenal glands, spleen, pancreas and stomach are grossly
unremarkable. No upper abdominal adenopathy.

Musculoskeletal: No worrisome lytic or sclerotic lesions.
Degenerative changes are seen in the spine.
IMPRESSION: 1. No evidence of interstitial lung disease.
2. Minimal ill-defined peribronchovascular nodularity in the
superior segment right lower lobe, indicative of an infectious
bronchiolitis.

## 2015-12-27 ENCOUNTER — Ambulatory Visit: Payer: No Typology Code available for payment source | Admitting: Cardiology

## 2016-01-02 ENCOUNTER — Other Ambulatory Visit: Payer: Self-pay

## 2016-01-02 MED ORDER — METOPROLOL SUCCINATE ER 25 MG PO TB24: 25.0000 mg | ORAL_TABLET | Freq: Every day | ORAL | Status: DC

## 2016-01-02 MED ORDER — METFORMIN HCL ER 500 MG PO TB24: 500.0000 mg | ORAL_TABLET | Freq: Every day | ORAL | Status: DC

## 2016-01-02 NOTE — Telephone Encounter (Signed)
Please ask pt to schedule a visit with me soon for her diabetes; we'll get fasting labs at that visit too I sent refills as requested; thank you

## 2016-01-02 NOTE — Telephone Encounter (Signed)
Left voice message informing patient.

## 2016-01-03 ENCOUNTER — Encounter: Payer: Self-pay | Admitting: Medical Oncology

## 2016-01-03 ENCOUNTER — Emergency Department
Admission: EM | Admit: 2016-01-03 | Discharge: 2016-01-03 | Disposition: A | Discharge status: Home / Self Care | Payer: No Typology Code available for payment source | Attending: Emergency Medicine | Admitting: Emergency Medicine

## 2016-01-03 DIAGNOSIS — Z79899 Other long term (current) drug therapy: Secondary | ICD-10-CM | POA: Insufficient documentation

## 2016-01-03 DIAGNOSIS — Z7982 Long term (current) use of aspirin: Secondary | ICD-10-CM | POA: Insufficient documentation

## 2016-01-03 DIAGNOSIS — Z7984 Long term (current) use of oral hypoglycemic drugs: Secondary | ICD-10-CM | POA: Insufficient documentation

## 2016-01-03 DIAGNOSIS — H6691 Otitis media, unspecified, right ear: Secondary | ICD-10-CM | POA: Insufficient documentation

## 2016-01-03 DIAGNOSIS — I10 Essential (primary) hypertension: Secondary | ICD-10-CM | POA: Diagnosis not present

## 2016-01-03 DIAGNOSIS — E119 Type 2 diabetes mellitus without complications: Secondary | ICD-10-CM | POA: Diagnosis not present

## 2016-01-03 DIAGNOSIS — H9201 Otalgia, right ear: Secondary | ICD-10-CM | POA: Diagnosis present

## 2016-01-03 MED ORDER — FEXOFENADINE-PSEUDOEPHED ER 60-120 MG PO TB12: 1.0000 | ORAL_TABLET | Freq: Two times a day (BID) | ORAL | Status: DC

## 2016-01-03 MED ORDER — TRAMADOL HCL 50 MG PO TABS: 50.0000 mg | ORAL_TABLET | Freq: Four times a day (QID) | ORAL | Status: DC | PRN

## 2016-01-03 MED ORDER — AMOXICILLIN 875 MG PO TABS: 875.0000 mg | ORAL_TABLET | Freq: Two times a day (BID) | ORAL | Status: DC

## 2016-01-03 NOTE — ED Notes (Signed)
Per Dr Glenetta HewMclaurin pt to be seen in flex care area.

## 2016-01-03 NOTE — ED Notes (Signed)
Pt reports rt ear pain that is painful to touch that began this am, pt also reports one area of rt side of face that feels numb but this is not unusual d/t hx of trigeminal neuralgia.

## 2016-01-03 NOTE — Discharge Instructions (Signed)
Otitis Media, Adult Otitis media is redness, soreness, and puffiness (swelling) in the space just behind your eardrum (middle ear). It may be caused by allergies or infection. It often happens along with a cold. HOME CARE: Advised follow-up with ENT clinic if there is no improvement in 3-5 days.  Take your medicine as told. Finish it even if you start to feel better.  Only take over-the-counter or prescription medicines for pain, discomfort, or fever as told by your doctor.  Follow up with your doctor as told. GET HELP IF:  You have otitis media only in one ear, or bleeding from your nose, or both.  You notice a lump on your neck.  You are not getting better in 3-5 days.  You feel worse instead of better. GET HELP RIGHT AWAY IF:   You have pain that is not helped with medicine.  You have puffiness, redness, or pain around your ear.  You get a stiff neck.  You cannot move part of your face (paralysis).  You notice that the bone behind your ear hurts when you touch it. MAKE SURE YOU:   Understand these instructions.  Will watch your condition.  Will get help right away if you are not doing well or get worse.   This information is not intended to replace advice given to you by your health care provider. Make sure you discuss any questions you have with your health care provider.   Document Released: 01/14/2008 Document Revised: 08/18/2014 Document Reviewed: 02/22/2013 Elsevier Interactive Patient Education [employer]2016 Else[employer] Inc.

## 2016-01-03 NOTE — ED Notes (Signed)
See triage note  Developed pain to right ear this am  Ear is tender to touch  Denies any injury   No fever Candis Musa/drainage

## 2016-01-03 NOTE — ED Provider Notes (Signed)
[facility] Emergency Department Provider Note   ____________________________________________  Time seen: Approximately 5:46 PM  I have reviewed the triage vital signs and the nursing notes.   HISTORY  Chief Complaint Otalgia    HPI Amanda Lyons is a 60 y.o. female patient complaining of right ear pain which are currently point a.m. awakening. Patient state is tender to touch. Patient denies any URI signs and symptoms except for some nasal congestion. Patient denies any fever or drainage from this complaint. Patient's concern because she had a history of trigeminal neuralgia ears ago. Patient state there is some mild hearing loss on the right side. Patient denies any vertigo. No palliative measures taken for this complaint.Patient is rating the pain as a 10 over 10.   Past Medical History  Diagnosis Date  . Hypertension   . GERD (gastroesophageal reflux disease)   . History of anxiety     had anxiety attacks  . History of kidney stones   . Trigeminal neuralgia   . HNP (herniated nucleus pulposus), lumbar 05/02/2013  . Dyslipidemia 11/08/2013  . Chest pain 11/08/2013    Patient Active Problem List   Diagnosis Date Noted  . Chronic right hip pain 10/26/2015  . Groin pain, chronic, right 10/26/2015  . Bronchiolitis 07/12/2015  . Chronic bronchitis (HCC) 07/10/2015  . Vitamin D deficiency 07/10/2015  . Sinusitis, acute 06/25/2015  . Dyshidrotic eczema 05/30/2015  . Type 2 diabetes mellitus (HCC) 05/30/2015  . Vaginal spotting 05/30/2015  . Neck mass 05/02/2015  . Hoarseness of voice 05/02/2015  . Cough 05/02/2015  . Dysphagia 05/02/2015  . Overweight (BMI 25.0-29.9) 02/06/2015  . Dyslipidemia 11/08/2013  . Hypertension   . GERD (gastroesophageal reflux disease)   . HNP (herniated nucleus pulposus), lumbar 05/02/2013    Past Surgical History  Procedure Laterality Date  . Abdominal hysterectomy    . Gamma knife  1999    for trigeminal  neuralgia  . Brain surgery      for trigeminal neuralgia  . Lumbar laminectomy/decompression microdiscectomy  04/19/2003    Right L5-S1 intralaminar laminotomy for excision of herniated disk with the operating microscope  . Lumbar laminectomy/decompression microdiscectomy Right 05/02/2013    Procedure: Right Lumbar Five Sacral One Microdiskectomy;  Surgeon: Temple PaciniHenry A Pool, MD;  Location: [facility];  Service: Neurosurgery;  Laterality: Right;  LUMBAR LAMINECTOMY/DECOMPRESSION MICRODISCECTOMY 1 LEVEL  . Left heart catheterization with coronary angiogram N/A 10/11/2014    Procedure: LEFT HEART CATHETERIZATION WITH CORONARY ANGIOGRAM;  Surgeon: Lesleigh NoeHenry W Aronda Burford III, MD;  [facility];  Service: Cardiovascular;  Laterality: N/A;    Current Outpatient Rx  Name  Route  Sig  Dispense  Refill  . albuterol (PROVENTIL HFA;VENTOLIN HFA) 108 (90 Base) MCG/ACT inhaler   Inhalation   Inhale 2 puffs into the lungs every 6 (six) hours as needed for wheezing or shortness of breath.   1 Inhaler   11   . amLODipine (NORVASC) 10 MG tablet   Oral   Take 1 tablet (10 mg total) by mouth daily.   90 tablet   1   . amoxicillin (AMOXIL) 875 MG tablet   Oral   Take 1 tablet (875 mg total) by mouth 2 (two) times daily.   20 tablet   0   . aspirin EC 81 MG EC tablet   Oral   Take 1 tablet (81 mg total) by mouth daily.   30 tablet   0   . atorvastatin (LIPITOR)  10 MG tablet   Oral   Take 1 tablet (10 mg total) by mouth at bedtime.   90 tablet   0   . fexofenadine-pseudoephedrine (ALLEGRA-D) 60-120 MG 12 hr tablet   Oral   Take 1 tablet by mouth 2 (two) times daily.   20 tablet   0   . Fluticasone Furoate-Vilanterol (BREO ELLIPTA) 100-25 MCG/INH AEPB   Inhalation   Inhale 1 puff into the lungs daily.   1 each   11   . glucose blood (BAYER CONTOUR TEST) test strip      Use as instructed daily; LON 6 months, E11.9   100 each   1   . losartan-hydrochlorothiazide (HYZAAR) 100-12.5 MG  tablet   Oral   Take 1 tablet by mouth daily.   90 tablet   2   . meloxicam (MOBIC) 15 MG tablet   Oral   Take 1 tablet (15 mg total) by mouth daily. Take aspirin at least one hour PRIOR to this medicine; take with food or milk   30 tablet   0   . metFORMIN (GLUCOPHAGE-XR) 500 MG 24 hr tablet   Oral   Take 1 tablet (500 mg total) by mouth daily with supper. (appt and labs soon please)   90 tablet   0   . metoprolol succinate (TOPROL-XL) 25 MG 24 hr tablet   Oral   Take 1 tablet (25 mg total) by mouth daily.   90 tablet   1   . nitroGLYCERIN (NITROSTAT) 0.4 MG SL tablet   Sublingual   Place 1 tablet (0.4 mg total) under the tongue every 5 (five) minutes as needed for chest pain (CP or SOB).   30 tablet   12     Call 911   . omeprazole (PRILOSEC) 40 MG capsule      40 mg daily.         Marland Kitchen oxyCODONE-acetaminophen (PERCOCET/ROXICET) 5-325 MG per tablet   Oral   Take 0.5 tablets by mouth daily as needed for moderate pain or severe pain (back pain from recent surgery).         . traMADol (ULTRAM) 50 MG tablet   Oral   Take 1 tablet (50 mg total) by mouth every 6 (six) hours as needed for moderate pain.   12 tablet   0     Allergies Prednisone and Sulfa antibiotics  Family History  Problem Relation Age of Onset  . CVA Father   . Heart failure Father   . Heart disease Father   . Diabetes Father   . Hypertension Father   . Ovarian cancer Mother   . Heart failure Maternal Grandmother   . Heart attack Maternal Grandfather   . Diabetes Sister   . Hypertension Sister     Social History Social History  Substance Use Topics  . Smoking status: Never Smoker   . Smokeless tobacco: Never Used  . Alcohol Use: No    Review of Systems Constitutional: No fever/chills Eyes: No visual changes. ENT: No sore throat.Right ear pain. Nasal congestion Cardiovascular: Denies chest pain. Respiratory: Denies shortness of breath. Gastrointestinal: No abdominal pain.  No  nausea, no vomiting.  No diarrhea.  No constipation. Genitourinary: Negative for dysuria. Musculoskeletal: Negative for back pain. Skin: Negative for rash. Neurological: Negative for headaches, focal weakness or numbness. Endocrine:Hypertension, hyperlipidemia, and diabetes. Hematological/Lymphatic: Allergic/Immunilogical: Prednisone and sulfa antibiotics.  ____________________________________________   PHYSICAL EXAM:  VITAL SIGNS: ED Triage Vitals  Enc Vitals Group  BP 01/03/16 1741 153/82 mmHg     Pulse Rate 01/03/16 1741 72     Resp 01/03/16 1741 18     Temp 01/03/16 1741 97 F (36.1 C)     Temp Source 01/03/16 1741 Oral     SpO2 01/03/16 1741 99 %     Weight 01/03/16 1741 163 lb (73.936 kg)     Height 01/03/16 1741 5\' 2"  (1.575 m)     Head Cir --      Peak Flow --      Pain Score 01/03/16 1742 10     Pain Loc --      Pain Edu? --      Excl. in GC? --     Constitutional: Alert and oriented. Well appearing and in no acute distress. Eyes: Conjunctivae are normal. PERRL. EOMI. Head: Atraumatic. Nose: No congestion/rhinnorhea.Bilateral edematous nasal turbinates. Bilateral maxillary guarding EARS: Edematous erythematous right ear TM. Left TM is edematous but nonerythematous. Mouth/Throat: Mucous membranes are moist.  Oropharynx non-erythematous. Neck: No stridor.  No cervical spine tenderness to palpation. Hematological/Lymphatic/Immunilogical: No cervical lymphadenopathy. Cardiovascular: Normal rate, regular rhythm. Grossly normal heart sounds.  Good peripheral circulation. Respiratory: Normal respiratory effort.  No retractions. Lungs CTAB. Gastrointestinal: Soft and nontender. No distention. No abdominal bruits. No CVA tenderness. Musculoskeletal: No lower extremity tenderness nor edema.  No joint effusions. Neurologic:  Normal speech and language. No gross focal neurologic deficits are appreciated. No gait instability. Skin:  Skin is warm, dry and intact. No  rash noted. Psychiatric: Mood and affect are normal. Speech and behavior are normal.  ____________________________________________   LABS (all labs ordered are listed, but only abnormal results are displayed)  Labs Reviewed - No data to display ____________________________________________  EKG   ____________________________________________  RADIOLOGY   ____________________________________________   PROCEDURES  Procedure(s) performed: None  Critical Care performed: No  ____________________________________________   INITIAL IMPRESSION / ASSESSMENT AND PLAN / ED COURSE  Pertinent labs & imaging results that were available during my care of the patient were reviewed by me and considered in my medical decision making (see chart for details).  Right otitis media. Patient given discharge Instructions. Patient advised to follow with the ENT clinic if there is no improvement in 3-5 days of her condition worsens. Patient advised to take this course secondary to her prior history. Get a prescription for amoxicillin, Allegra-D, and tramadol. ____________________________________________   FINAL CLINICAL IMPRESSION(S) / ED DIAGNOSES  Final diagnoses:  Acute right otitis media, recurrence not specified, unspecified otitis media type      NEW MEDICATIONS STARTED DURING THIS VISIT:  New Prescriptions   AMOXICILLIN (AMOXIL) 875 MG TABLET    Take 1 tablet (875 mg total) by mouth 2 (two) times daily.   FEXOFENADINE-PSEUDOEPHEDRINE (ALLEGRA-D) 60-120 MG 12 HR TABLET    Take 1 tablet by mouth 2 (two) times daily.   TRAMADOL (ULTRAM) 50 MG TABLET    Take 1 tablet (50 mg total) by mouth every 6 (six) hours as needed for moderate pain.     Note:  This document was prepared using Dragon voice recognition software and may include unintentional dictation errors.    Joni Reining, PA-C 01/03/16 1826  Maurilio Lovely, MD 01/04/16 [phone]

## 2016-01-09 ENCOUNTER — Other Ambulatory Visit: Payer: Self-pay

## 2016-01-09 MED ORDER — MELOXICAM 15 MG PO TABS: 15.0000 mg | ORAL_TABLET | Freq: Every day | ORAL | Status: DC

## 2016-01-09 NOTE — Telephone Encounter (Signed)
Patient is actually overdue for visit for her diabetes; let's get her scheduled please for sometime in the next month, and ask her to come fasting; thank you

## 2016-01-10 NOTE — Telephone Encounter (Signed)
LVM for pt to call the office to schedule an appt. °

## 2016-01-14 ENCOUNTER — Encounter: Payer: Self-pay | Admitting: Family Medicine

## 2016-01-14 ENCOUNTER — Ambulatory Visit (INDEPENDENT_AMBULATORY_CARE_PROVIDER_SITE_OTHER): Payer: No Typology Code available for payment source | Admitting: Family Medicine

## 2016-01-14 VITALS — BP 120/82 | HR 71 | Temp 98.5°F | Resp 16 | Wt 160.0 lb

## 2016-01-14 DIAGNOSIS — E785 Hyperlipidemia, unspecified: Secondary | ICD-10-CM | POA: Diagnosis not present

## 2016-01-14 DIAGNOSIS — E559 Vitamin D deficiency, unspecified: Secondary | ICD-10-CM

## 2016-01-14 DIAGNOSIS — Z5181 Encounter for therapeutic drug level monitoring: Secondary | ICD-10-CM | POA: Diagnosis not present

## 2016-01-14 DIAGNOSIS — E119 Type 2 diabetes mellitus without complications: Secondary | ICD-10-CM | POA: Diagnosis not present

## 2016-01-14 DIAGNOSIS — R5383 Other fatigue: Secondary | ICD-10-CM | POA: Diagnosis not present

## 2016-01-14 DIAGNOSIS — E663 Overweight: Secondary | ICD-10-CM

## 2016-01-14 NOTE — Assessment & Plan Note (Signed)
encourgement given to lose another 5 pounds

## 2016-01-14 NOTE — Assessment & Plan Note (Signed)
Continue statin; check lipids and sgpt

## 2016-01-14 NOTE — Assessment & Plan Note (Signed)
Check level and supplement 

## 2016-01-14 NOTE — Patient Instructions (Signed)
Please have fasting labs done Your goal blood pressure is less than 130 mmHg on top. Try to follow the DASH guidelines (DASH stands for Dietary Approaches to Stop Hypertension) Try to limit the sodium in your diet.  Ideally, consume less than 1.5 grams (less than 1,500mg ) per day. Do not add salt when cooking or at the table.  Check the sodium amount on labels when shopping, and choose items lower in sodium when given a choice. Avoid or limit foods that already contain a lot of sodium. Eat a diet rich in fruits and vegetables and whole grains. Please do see your eye doctor regularly, and have your eyes examined every year (or more often per his or her recommendation) Check your feet every night and let me know right away of any sores, infections, numbness, etc. Try to limit sweets, white bread, white rice, white potatoes It is okay with me for you to not check your fingerstick blood sugars (per [employer] Best Practices), unless you are interested and feel it would be helpful for you Return in 3 months if A1c is 7 or over, but you can return in 6 months if the A1c is under 7

## 2016-01-14 NOTE — Assessment & Plan Note (Signed)
Check A1c today; limit sugary drinks and white carbs

## 2016-01-14 NOTE — Progress Notes (Signed)
BP 120/82 mmHg  Pulse 71  Temp(Src) 98.5 F (36.9 C) (Oral)  Resp 16  Wt 160 lb (72.576 kg)  SpO2 95%   Subjective:    Patient ID: Amanda Lyons, female    DOB: [birth year], 60 y.o.   MRN: [id number]  HPI: Amanda Lyons is a 60 y.o. female  Chief Complaint  Patient presents with  . Medication Refill   She went to the ER; fluid behind the ear; he gave her amoxicillin and tramadol; still tender; earlier, couldn't even touch it; thought maybe trigeminal; some dizziness  Type 2 diabetes; not checking sugars with my blessing; no dry mouth; no blurred vision; no sores on the feet Last A1c October 6.6, well-controlled; last eye exam in the last month, no diabetic complications  Last lipid panel LDL of 73, HDL 68; taking medicine atorvastatin 10 mg; some aches, but not sure if from the medicine  Low energy level; going on for a month; no bleeding; gets lots of sun exposure, takes vit D; fingers stays wrinkly; has eczema  Breathing issues followed by pulmonologist; on inhalers  Depression screen [facility] 2/9 01/14/2016  Decreased Interest 0  Down, Depressed, Hopeless 0  PHQ - 2 Score 0   Relevant past medical, surgical, family and social history reviewed Past Medical History  Diagnosis Date  . Hypertension   . GERD (gastroesophageal reflux disease)   . History of anxiety     had anxiety attacks  . History of kidney stones   . Trigeminal neuralgia   . HNP (herniated nucleus pulposus), lumbar 05/02/2013  . Dyslipidemia 11/08/2013  . Chest pain 11/08/2013   Past Surgical History  Procedure Laterality Date  . Abdominal hysterectomy    . Gamma knife  1999    for trigeminal neuralgia  . Brain surgery      for trigeminal neuralgia  . Lumbar laminectomy/decompression microdiscectomy  04/19/2003    Right L5-S1 intralaminar laminotomy for excision of herniated disk with the operating microscope  . Lumbar laminectomy/decompression microdiscectomy Right 05/02/2013    Procedure: Right  Lumbar Five Sacral One Microdiskectomy;  Surgeon: Temple Pacini, MD;  Location: [facility] NEURO ORS;  Service: Neurosurgery;  Laterality: Right;  LUMBAR LAMINECTOMY/DECOMPRESSION MICRODISCECTOMY 1 LEVEL  . Left heart catheterization with coronary angiogram N/A 10/11/2014    Procedure: LEFT HEART CATHETERIZATION WITH CORONARY ANGIOGRAM;  Surgeon: Lesleigh Noe, MD;  Location: [facility] CATH LAB;  Service: Cardiovascular;  Laterality: N/A;   Family History  Problem Relation Age of Onset  . CVA Father   . Heart failure Father   . Heart disease Father   . Diabetes Father   . Hypertension Father   . Ovarian cancer Mother   . Heart failure Maternal Grandmother   . Heart attack Maternal Grandfather   . Diabetes Sister   . Hypertension Sister   mother had B12 def, father had B12 def  Social History  Substance Use Topics  . Smoking status: Never Smoker   . Smokeless tobacco: Never Used  . Alcohol Use: No   Interim medical history since last visit reviewed. Allergies and medications reviewed  Review of Systems Per HPI unless specifically indicated above     Objective:    BP 120/82 mmHg  Pulse 71  Temp(Src) 98.5 F (36.9 C) (Oral)  Resp 16  Wt 160 lb (72.576 kg)  SpO2 95%  Wt Readings from Last 3 Encounters:  01/14/16 160 lb (72.576 kg)  01/03/16 163 lb (73.936 kg)  11/13/15 162  lb 9.6 oz (73.755 kg)   body mass index is 29.26 kg/(m^2).  Physical Exam  Constitutional: She appears well-developed and well-nourished. No distress.  HENT:  Head: Normocephalic and atraumatic.  Eyes: EOM are normal. No scleral icterus.  Neck: No thyromegaly present.  Cardiovascular: Normal rate, regular rhythm and normal heart sounds.   Pulmonary/Chest: Effort normal and breath sounds normal.  Abdominal: Soft. Bowel sounds are normal. She exhibits no distension.  Musculoskeletal: Normal range of motion. She exhibits no edema.  Neurological: She is alert.  Skin: Skin is warm and dry. She is not diaphoretic.  No pallor.  Psychiatric: She has a normal mood and affect. Her behavior is normal. Judgment and thought content normal.   Diabetic Foot Form - Detailed   Diabetic Foot Exam - detailed  Diabetic Foot exam was performed with the following findings:  Yes 01/14/2016  9:22 AM  Visual Foot Exam completed.:  Yes  Are the toenails long?:  No  Are the toenails thick?:  No  Are the toenails ingrown?:  No  Normal Range of Motion:  Yes    Pulse Foot Exam completed.:  Yes  Right Dorsalis Pedis:  Present Left Dorsalis Pedis:  Present  Sensory Foot Exam Completed.:  Yes  Swelling:  No  Semmes-Weinstein Monofilament Test  R Site 1-Great Toe:  Pos L Site 1-Great Toe:  Pos  R Site 4:  Pos L Site 4:  Pos  R Site 5:  Pos L Site 5:  Pos    Comments:  High-riding 4th toes on each foot; 2nd toes are lengthy; no calluses        Assessment & Plan:   Problem List Items Addressed This Visit      Endocrine   Type 2 diabetes mellitus (HCC) - Primary    Check A1c today; limit sugary drinks and white carbs      Relevant Orders   Hemoglobin A1c   Lipid Panel w/o Chol/HDL Ratio     Other   Dyslipidemia (Chronic)    Continue statin; check lipids and sgpt      Relevant Orders   Lipid Panel w/o Chol/HDL Ratio   Medication monitoring encounter   Relevant Orders   CBC with Differential/Platelet   Comprehensive metabolic panel   Overweight (BMI 25.0-29.9)    encourgement given to lose another 5 pounds      Vitamin D deficiency    Check level and supplement      Relevant Orders   VITAMIN D 25 Hydroxy (Vit-D Deficiency, Fractures)    Other Visit Diagnoses    Other fatigue        Relevant Orders    CBC with Differential/Platelet    TSH    Vitamin B12       Follow up plan: Return in about 6 months (around 07/15/2016) for fasting labs and visit if A1c less than 7.  An after-visit summary was printed and given to the patient at check-out.  Please see the patient instructions which may contain  other information and recommendations beyond what is mentioned above in the assessment and plan.  Orders Placed This Encounter  Procedures  . Hemoglobin A1c  . Lipid Panel w/o Chol/HDL Ratio  . CBC with Differential/Platelet  . Comprehensive metabolic panel  . TSH  . VITAMIN D 25 Hydroxy (Vit-D Deficiency, Fractures)  . Vitamin B12

## 2016-01-16 LAB — HEMOGLOBIN A1C
Est. average glucose Bld gHb Est-mCnc: 137 mg/dL
Hgb A1c MFr Bld: 6.4 % — ABNORMAL HIGH (ref 4.8–5.6)

## 2016-01-16 LAB — COMPREHENSIVE METABOLIC PANEL
A/G RATIO: 2 (ref 1.2–2.2)
ALT: 18 IU/L (ref 0–32)
AST: 14 IU/L (ref 0–40)
Albumin: 4.6 g/dL (ref 3.5–5.5)
Alkaline Phosphatase: 124 IU/L — ABNORMAL HIGH (ref 39–117)
BUN/Creatinine Ratio: 32 — ABNORMAL HIGH (ref 9–23)
BUN: 22 mg/dL (ref 6–24)
Bilirubin Total: 0.4 mg/dL (ref 0.0–1.2)
CALCIUM: 10.2 mg/dL (ref 8.7–10.2)
CHLORIDE: 98 mmol/L (ref 96–106)
CO2: 26 mmol/L (ref 18–29)
Creatinine, Ser: 0.68 mg/dL (ref 0.57–1.00)
GFR calc Af Amer: 112 mL/min/{1.73_m2} (ref >59)
GFR, EST NON AFRICAN AMERICAN: 97 mL/min/{1.73_m2} (ref >59)
GLUCOSE: 106 mg/dL — AB (ref 65–99)
Globulin, Total: 2.3 g/dL (ref 1.5–4.5)
POTASSIUM: 4.4 mmol/L (ref 3.5–5.2)
Sodium: 142 mmol/L (ref 134–144)
Total Protein: 6.9 g/dL (ref 6.0–8.5)

## 2016-01-16 LAB — CBC WITH DIFFERENTIAL/PLATELET
BASOS ABS: 0 10*3/uL (ref 0.0–0.2)
Basos: 0 %
EOS (ABSOLUTE): 0.2 10*3/uL (ref 0.0–0.4)
Eos: 2 %
Hematocrit: 40.7 % (ref 34.0–46.6)
Hemoglobin: 13.3 g/dL (ref 11.1–15.9)
IMMATURE GRANULOCYTES: 0 %
Immature Grans (Abs): 0 10*3/uL (ref 0.0–0.1)
Lymphocytes Absolute: 1.6 10*3/uL (ref 0.7–3.1)
Lymphs: 23 %
MCH: 28.4 pg (ref 26.6–33.0)
MCHC: 32.7 g/dL (ref 31.5–35.7)
MCV: 87 fL (ref 79–97)
MONOS ABS: 0.6 10*3/uL (ref 0.1–0.9)
Monocytes: 9 %
NEUTROS PCT: 66 %
Neutrophils Absolute: 4.6 10*3/uL (ref 1.4–7.0)
PLATELETS: 333 10*3/uL (ref 150–379)
RBC: 4.68 x10E6/uL (ref 3.77–5.28)
RDW: 14 % (ref 12.3–15.4)
WBC: 7 10*3/uL (ref 3.4–10.8)

## 2016-01-16 LAB — LIPID PANEL W/O CHOL/HDL RATIO
Cholesterol, Total: 171 mg/dL (ref 100–199)
HDL: 72 mg/dL (ref >39)
LDL CALC: 80 mg/dL (ref 0–99)
Triglycerides: 95 mg/dL (ref 0–149)
VLDL CHOLESTEROL CAL: 19 mg/dL (ref 5–40)

## 2016-01-16 LAB — VITAMIN B12: VITAMIN B 12: 294 pg/mL (ref 211–946)

## 2016-01-16 LAB — VITAMIN D 25 HYDROXY (VIT D DEFICIENCY, FRACTURES): VIT D 25 HYDROXY: 40.9 ng/mL (ref 30.0–100.0)

## 2016-01-16 LAB — TSH: TSH: 1.93 u[IU]/mL (ref 0.450–4.500)

## 2016-02-09 ENCOUNTER — Other Ambulatory Visit: Payer: Self-pay | Admitting: Family Medicine

## 2016-02-09 NOTE — Telephone Encounter (Signed)
One year of amlo approved

## 2016-03-11 ENCOUNTER — Other Ambulatory Visit: Payer: Self-pay

## 2016-03-11 ENCOUNTER — Other Ambulatory Visit: Payer: Self-pay | Admitting: *Deleted

## 2016-03-11 MED ORDER — FLUTICASONE FUROATE-VILANTEROL 100-25 MCG/INH IN AEPB: 1.0000 | INHALATION_SPRAY | Freq: Every day | RESPIRATORY_TRACT | 3 refills | Status: DC

## 2016-03-12 MED ORDER — ATORVASTATIN CALCIUM 10 MG PO TABS: 10.0000 mg | ORAL_TABLET | Freq: Every day | ORAL | 1 refills | Status: DC

## 2016-03-12 NOTE — Telephone Encounter (Signed)
Last lipids and sgpt reviewed; Rx approved 

## 2016-04-09 DIAGNOSIS — M5412 Radiculopathy, cervical region: Secondary | ICD-10-CM | POA: Insufficient documentation

## 2016-04-12 ENCOUNTER — Encounter: Payer: Self-pay | Admitting: Family Medicine

## 2016-04-12 DIAGNOSIS — M7541 Impingement syndrome of right shoulder: Secondary | ICD-10-CM | POA: Insufficient documentation

## 2016-04-15 ENCOUNTER — Other Ambulatory Visit: Payer: Self-pay | Admitting: Specialist

## 2016-04-15 DIAGNOSIS — M7541 Impingement syndrome of right shoulder: Secondary | ICD-10-CM

## 2016-04-23 ENCOUNTER — Telehealth: Payer: Self-pay | Admitting: Family Medicine

## 2016-04-23 DIAGNOSIS — Z1231 Encounter for screening mammogram for malignant neoplasm of breast: Secondary | ICD-10-CM

## 2016-04-23 NOTE — Telephone Encounter (Signed)
Note received from insurance Pt due for mammo Ordered Please call her

## 2016-04-23 NOTE — Assessment & Plan Note (Signed)
Order mammo 

## 2016-04-24 NOTE — Telephone Encounter (Signed)
I tried to call and go ahead and setup appt , but they need to know when last mammo was and where? Left voicemail for patient

## 2016-04-24 NOTE — Telephone Encounter (Signed)
Pt.notified

## 2016-04-28 ENCOUNTER — Ambulatory Visit
Admission: RE | Admit: 2016-04-28 | Discharge: 2016-04-28 | Disposition: A | Discharge status: Home / Self Care | Payer: No Typology Code available for payment source | Source: Ambulatory Visit | Attending: Specialist | Admitting: Specialist

## 2016-04-28 DIAGNOSIS — S46811A Strain of other muscles, fascia and tendons at shoulder and upper arm level, right arm, initial encounter: Secondary | ICD-10-CM | POA: Insufficient documentation

## 2016-04-28 DIAGNOSIS — M7541 Impingement syndrome of right shoulder: Secondary | ICD-10-CM | POA: Insufficient documentation

## 2016-04-28 DIAGNOSIS — M75121 Complete rotator cuff tear or rupture of right shoulder, not specified as traumatic: Secondary | ICD-10-CM | POA: Insufficient documentation

## 2016-04-28 DIAGNOSIS — X58XXXA Exposure to other specified factors, initial encounter: Secondary | ICD-10-CM | POA: Insufficient documentation

## 2016-04-29 ENCOUNTER — Ambulatory Visit
Admission: RE | Admit: 2016-04-29 | Discharge: 2016-04-29 | Disposition: A | Discharge status: Home / Self Care | Payer: No Typology Code available for payment source | Source: Ambulatory Visit | Attending: Family Medicine | Admitting: Family Medicine

## 2016-04-29 DIAGNOSIS — Z1231 Encounter for screening mammogram for malignant neoplasm of breast: Secondary | ICD-10-CM | POA: Insufficient documentation

## 2016-05-09 ENCOUNTER — Telehealth: Payer: Self-pay | Admitting: Family Medicine

## 2016-05-09 NOTE — Telephone Encounter (Signed)
PT SAID THAT DR Garen LahMILLERS OFFICE TOLD HER THAT SHE IS NEEDING A EKG AND THEY WERE TO HAVE SENT THE REQUEST AND WOULD SEND ANYTHING ELSE THAT MAY NEED TO BE DONE. THE OFFICE SAID THAT THEY WERE TO HAVE SENT THIS ON Wednesday. TO US ABOUT THIS. SURGERY IS SCHEDULED FOR OCT 18TH.  PLEASE CALL PT AND WILL NEED EARLY APPT FOR SHE WORKS 2 ND SHIFT.

## 2016-05-13 NOTE — Telephone Encounter (Signed)
LFT MESSAGE FOR PT DUE TO MESSAGE

## 2016-05-14 ENCOUNTER — Other Ambulatory Visit: Payer: Self-pay | Admitting: Specialist

## 2016-05-22 ENCOUNTER — Encounter
Admission: RE | Admit: 2016-05-22 | Discharge: 2016-05-22 | Disposition: A | Discharge status: Home / Self Care | Payer: No Typology Code available for payment source | Source: Ambulatory Visit | Attending: Specialist | Admitting: Specialist

## 2016-05-22 ENCOUNTER — Ambulatory Visit (INDEPENDENT_AMBULATORY_CARE_PROVIDER_SITE_OTHER): Payer: No Typology Code available for payment source | Admitting: Family Medicine

## 2016-05-22 ENCOUNTER — Encounter: Payer: Self-pay | Admitting: Family Medicine

## 2016-05-22 VITALS — BP 140/86 | HR 80 | Temp 98.1°F | Resp 14 | Wt 165.0 lb

## 2016-05-22 DIAGNOSIS — R9431 Abnormal electrocardiogram [ECG] [EKG]: Secondary | ICD-10-CM | POA: Insufficient documentation

## 2016-05-22 DIAGNOSIS — I10 Essential (primary) hypertension: Secondary | ICD-10-CM | POA: Diagnosis not present

## 2016-05-22 DIAGNOSIS — Z01818 Encounter for other preprocedural examination: Secondary | ICD-10-CM | POA: Diagnosis not present

## 2016-05-22 DIAGNOSIS — E119 Type 2 diabetes mellitus without complications: Secondary | ICD-10-CM | POA: Insufficient documentation

## 2016-05-22 HISTORY — DX: Unspecified asthma, uncomplicated: J45.909

## 2016-05-22 HISTORY — DX: Type 2 diabetes mellitus without complications: E11.9

## 2016-05-22 HISTORY — DX: Anxiety disorder, unspecified: F41.9

## 2016-05-22 HISTORY — DX: Cardiac arrhythmia, unspecified: I49.9

## 2016-05-22 LAB — BASIC METABOLIC PANEL
ANION GAP: 7 (ref 5–15)
BUN: 24 mg/dL — AB (ref 6–20)
CHLORIDE: 104 mmol/L (ref 101–111)
CO2: 28 mmol/L (ref 22–32)
Calcium: 9.8 mg/dL (ref 8.9–10.3)
Creatinine, Ser: 0.6 mg/dL (ref 0.44–1.00)
GFR calc Af Amer: >60 mL/min (ref >60)
GFR calc non Af Amer: >60 mL/min (ref >60)
GLUCOSE: 110 mg/dL — AB (ref 65–99)
POTASSIUM: 3.5 mmol/L (ref 3.5–5.1)
Sodium: 139 mmol/L (ref 135–145)

## 2016-05-22 NOTE — Patient Instructions (Signed)
  Your procedure is scheduled on: 18/18/17 Report to Day Surgery. [facility]OND FLOOR To find out your arrival time please call [phone] between 1PM - 3PM on 05/27/16  Remember: Instructions that are not followed completely may result in serious medical risk, up to and including death, or upon the discretion of your surgeon and anesthesiologist your surgery may need to be rescheduled.    __X__ 1. Do not eat food or drink liquids after midnight. No gum chewing or hard candies.     ____ 2. No Alcohol for 24 hours before or after surgery.   ____ 3. Do Not Smoke For 24 Hours Prior to Your Surgery.   ____ 4. Bring all medications with you on the day of surgery if instructed.    __X__ 5. Notify your doctor if there is any change in your medical condition     (cold, fever, infections).       Do not wear jewelry, make-up, hairpins, clips or nail polish.  Do not wear lotions, powders, or perfumes. You may wear deodorant.  Do not shave 48 hours prior to surgery. Men may shave face and neck.  Do not bring valuables to the hospital.    [facility] belongings or valuables.               Contacts, dentures or bridgework may not be worn into surgery.  Leave your suitcase in the car. After surgery it may be brought to your room.  For patients admitted to the hospital, discharge time is determined by your                treatment team.   Patients discharged the day of surgery will not be allowed to drive home.      _X_ Take these medicines the morning of surgery with A SIP OF WATER:    1.AMLODIPINE  2. OMEPRAZOLE AT BEDTIME 05/27/16 AND AM SURGERY  3. LIPITOR  4.  5.  6.  ____ Fleet Enema (as directed)   __X__ Use CHG Soap as directed  __X__ Use inhalers on the day of surgery  _X___ Stop metformin 2 days prior to surgery    ____ Take 1/2 of usual insulin dose the night before surgery and none on the morning of surgery.   ____ Stop  Coumadin/Plavix/aspirin on  _X___ Stop Anti-inflammatories on  STOP MELOXICAM UNTIL AFTER SURGERY  _X___ Stop supplements until after surgery.   STOP B 12 UNTIL AFTER  SURGERY   ____ Bring C-Pap to the hospital.

## 2016-05-22 NOTE — Pre-Procedure Instructions (Addendum)
FAXING EKG TO DR LADA WHO IS SEEING PATIENT TO CLEAR TODAY

## 2016-05-22 NOTE — Progress Notes (Signed)
BP 140/86   Pulse 80   Temp 98.1 F (36.7 C) (Oral)   Resp 14   Wt 165 lb (74.8 kg)   SpO2 96%   BMI 29.23 kg/m    Subjective:    Patient ID: Amanda Lyons, female    DOB: [birth year], 60 y.o.   MRN: [id number]  HPI: Amanda Lyons is a 60 y.o. female  Chief Complaint  Patient presents with  . surgical clearance    right shoulder surgery   She is having right shoulder surgery next week, Oct 18th Dr. Hyacinth Meeker is going to operate, the tendon has tore completely away from the bone and has pushed back up into the shoulder; he is going to repair it She just had EKG and labs done at the hospital; they told her that her EKG was fine We reviewed her BMP, creatinine 0.8; BUN 24 (I encouraged her to drink more water)  No fevers recently No boils or sores on the skin No recent cough No chest pain No dysuria No previous problems with anesthesia or being put to sleep No diarrhea, no blood in the urine or stool She has quit taking aspirin yesterday, off for one week prior Not usually an easy bleeder, no platelet abnormalities No alcohol; no smoking She is left-handed; she will go right home after surgery No allergies to pain medicine She stopped her aspirin yesterday, and was told to take her last dose of metformin on Sunday, but hold it prior to surgery; she has also been instructed to stop meloxicam, etc. No idea what her sugars have been running lately Hx of low B12 and taking supplement; just started in June Vitamin D had come back up from 28.1 to 40.9; on 1000 iu daily Platelet count 333 on January 15, 2016 Last A1c in June was 6.4; excellent control  Depression screen [facility] 2/9 05/22/2016 01/14/2016  Decreased Interest 0 0  Down, Depressed, Hopeless 0 0  PHQ - 2 Score 0 0   Relevant past medical, surgical, family and social history reviewed Past Medical History:  Diagnosis Date  . Anxiety   . Asthma   . Chest pain 11/08/2013  . Diabetes mellitus without complication (HCC)   .  Dyslipidemia 11/08/2013  . Dysrhythmia    fast  . GERD (gastroesophageal reflux disease)   . History of anxiety    had anxiety attacks  . History of kidney stones   . HNP (herniated nucleus pulposus), lumbar 05/02/2013  . Hypertension   . Trigeminal neuralgia    Past Surgical History:  Procedure Laterality Date  . ABDOMINAL HYSTERECTOMY    . BRAIN SURGERY     for trigeminal neuralgia  . CARDIAC CATHETERIZATION    . gamma knife  1999   for trigeminal neuralgia  . LEFT HEART CATHETERIZATION WITH CORONARY ANGIOGRAM N/A 10/11/2014   Procedure: LEFT HEART CATHETERIZATION WITH CORONARY ANGIOGRAM;  Surgeon: Lesleigh Noe, MD;  Location: [facility] CATH LAB;  Service: Cardiovascular;  Laterality: N/A;  . LUMBAR LAMINECTOMY/DECOMPRESSION MICRODISCECTOMY  04/19/2003   Right L5-S1 intralaminar laminotomy for excision of herniated disk with the operating microscope  . LUMBAR LAMINECTOMY/DECOMPRESSION MICRODISCECTOMY Right 05/02/2013   Procedure: Right Lumbar Five Sacral One Microdiskectomy;  Surgeon: Temple Pacini, MD;  Location: [facility] NEURO ORS;  Service: Neurosurgery;  Laterality: Right;  LUMBAR LAMINECTOMY/DECOMPRESSION MICRODISCECTOMY 1 LEVEL   Family History  Problem Relation Age of Onset  . CVA Father   . Heart failure Father   . Heart disease  Father   . Diabetes Father   . Hypertension Father   . Ovarian cancer Mother     early 3370's  . Heart failure Maternal Grandmother   . Heart attack Maternal Grandfather   . Diabetes Sister   . Hypertension Sister    Social History  Substance Use Topics  . Smoking status: Never Smoker  . Smokeless tobacco: Never Used  . Alcohol use No   Interim medical history since last visit reviewed. Allergies and medications reviewed  Review of Systems Per HPI unless specifically indicated above     Objective:    BP 140/86   Pulse 80   Temp 98.1 F (36.7 C) (Oral)   Resp 14   Wt 165 lb (74.8 kg)   SpO2 96%   BMI 29.23 kg/m   Wt Readings from Last 3  Encounters:  05/22/16 165 lb (74.8 kg)  05/22/16 167 lb (75.8 kg)  01/14/16 160 lb (72.6 kg)    Physical Exam  Constitutional: She appears well-developed and well-nourished. No distress.  HENT:  Head: Normocephalic and atraumatic.  Eyes: EOM are normal. No scleral icterus.  Neck: No thyromegaly present.  Cardiovascular: Normal rate, regular rhythm and normal heart sounds.   No murmur heard. Pulmonary/Chest: Effort normal and breath sounds normal. No respiratory distress. She has no wheezes.  Abdominal: Soft. Bowel sounds are normal. She exhibits no distension.  Musculoskeletal: Normal range of motion. She exhibits no edema.  Neurological: She is alert. She exhibits normal muscle tone.  Skin: Skin is warm and dry. She is not diaphoretic. No pallor.  Psychiatric: She has a normal mood and affect. Her behavior is normal. Judgment and thought content normal.    Results for orders placed or performed during the hospital encounter of 05/22/16  Basic metabolic panel  Result Value Ref Range   Sodium 139 135 - 145 mmol/L   Potassium 3.5 3.5 - 5.1 mmol/L   Chloride 104 101 - 111 mmol/L   CO2 28 22 - 32 mmol/L   Glucose, Bld 110 (H) 65 - 99 mg/dL   BUN 24 (H) 6 - 20 mg/dL   Creatinine, Ser 4.090.60 0.44 - 1.00 mg/dL   Calcium 9.8 8.9 - 81.110.3 mg/dL   GFR calc non Af Amer >60 >60 mL/min   GFR calc Af Amer >60 >60 mL/min   Anion gap 7 5 - 15      Assessment & Plan:   Problem List Items Addressed This Visit    None    Visit Diagnoses    Pre-operative clearance    -  Primary   cleared for surgery; forms completed for pre-operative clearance; faxing to orthopaedist; restart aspirin and metformin after surgery per ortho recs      Follow up plan: Return in about 8 weeks (around 07/16/2016) for fasting labs and visit; Dec 6th or LATER.  An after-visit summary was printed and given to the patient at check-out.  Please see the patient instructions which may contain other information and  recommendations beyond what is mentioned above in the assessment and plan.

## 2016-05-22 NOTE — Patient Instructions (Signed)
I am clearing your for surgery Do not take any metformin after your dose on Sunday Start back on aspirin and metformin according to your surgeon's recommendations

## 2016-05-23 ENCOUNTER — Other Ambulatory Visit: Payer: Self-pay | Admitting: Family Medicine

## 2016-05-26 NOTE — Pre-Procedure Instructions (Signed)
CLEARED BY DR LADA 05/22/16

## 2016-05-28 ENCOUNTER — Encounter
Admission: RE | Disposition: A | Discharge status: Home / Self Care | Payer: Self-pay | Source: Ambulatory Visit | Attending: Specialist

## 2016-05-28 ENCOUNTER — Ambulatory Visit
Admission: RE | Admit: 2016-05-28 | Discharge: 2016-05-28 | Disposition: A | Discharge status: Home / Self Care | Payer: No Typology Code available for payment source | Source: Ambulatory Visit | Attending: Specialist | Admitting: Specialist

## 2016-05-28 ENCOUNTER — Ambulatory Visit: Payer: No Typology Code available for payment source | Admitting: Anesthesiology

## 2016-05-28 ENCOUNTER — Encounter: Payer: Self-pay | Admitting: *Deleted

## 2016-05-28 DIAGNOSIS — K219 Gastro-esophageal reflux disease without esophagitis: Secondary | ICD-10-CM | POA: Insufficient documentation

## 2016-05-28 DIAGNOSIS — M7541 Impingement syndrome of right shoulder: Secondary | ICD-10-CM | POA: Insufficient documentation

## 2016-05-28 DIAGNOSIS — M66811 Spontaneous rupture of other tendons, right shoulder: Secondary | ICD-10-CM | POA: Diagnosis not present

## 2016-05-28 DIAGNOSIS — M19011 Primary osteoarthritis, right shoulder: Secondary | ICD-10-CM | POA: Insufficient documentation

## 2016-05-28 DIAGNOSIS — F419 Anxiety disorder, unspecified: Secondary | ICD-10-CM | POA: Insufficient documentation

## 2016-05-28 DIAGNOSIS — E119 Type 2 diabetes mellitus without complications: Secondary | ICD-10-CM | POA: Diagnosis not present

## 2016-05-28 DIAGNOSIS — M75101 Unspecified rotator cuff tear or rupture of right shoulder, not specified as traumatic: Secondary | ICD-10-CM | POA: Insufficient documentation

## 2016-05-28 DIAGNOSIS — Z90710 Acquired absence of both cervix and uterus: Secondary | ICD-10-CM | POA: Insufficient documentation

## 2016-05-28 DIAGNOSIS — I10 Essential (primary) hypertension: Secondary | ICD-10-CM | POA: Insufficient documentation

## 2016-05-28 DIAGNOSIS — J45909 Unspecified asthma, uncomplicated: Secondary | ICD-10-CM | POA: Insufficient documentation

## 2016-05-28 DIAGNOSIS — M7551 Bursitis of right shoulder: Secondary | ICD-10-CM | POA: Diagnosis not present

## 2016-05-28 HISTORY — PX: SHOULDER ARTHROSCOPY WITH OPEN ROTATOR CUFF REPAIR: SHX6092

## 2016-05-28 LAB — GLUCOSE, CAPILLARY
GLUCOSE-CAPILLARY: 130 mg/dL — AB (ref 65–99)
Glucose-Capillary: 124 mg/dL — ABNORMAL HIGH (ref 65–99)

## 2016-05-28 SURGERY — ARTHROSCOPY, SHOULDER WITH REPAIR, ROTATOR CUFF, OPEN
Anesthesia: General | Site: Shoulder | Laterality: Right | Wound class: Clean

## 2016-05-28 MED ORDER — PHENYLEPHRINE HCL 10 MG/ML IJ SOLN
INTRAMUSCULAR | Status: DC | PRN
  Administered 2016-05-28: 100 ug via INTRAVENOUS
  Administered 2016-05-28 (×3): 200 ug via INTRAVENOUS
  Administered 2016-05-28 (×2): 100 ug via INTRAVENOUS

## 2016-05-28 MED ORDER — SODIUM CHLORIDE 0.9 % IV SOLN
INTRAVENOUS | Status: DC | PRN
Start: 2016-05-28 — End: 2016-05-28
  Administered 2016-05-28: 20 ug/min via INTRAVENOUS

## 2016-05-28 MED ORDER — PROPOFOL 10 MG/ML IV BOLUS: INTRAVENOUS | Status: DC | PRN

## 2016-05-28 MED ORDER — EPINEPHRINE 30 MG/30ML IJ SOLN
INTRAMUSCULAR | Status: AC
  Filled 2016-05-28: qty 1

## 2016-05-28 MED ORDER — HYDROCODONE-ACETAMINOPHEN 7.5-325 MG PO TABS: 1.0000 | ORAL_TABLET | Freq: Four times a day (QID) | ORAL | 0 refills | Status: DC | PRN

## 2016-05-28 MED ORDER — CELECOXIB 200 MG PO CAPS
ORAL_CAPSULE | ORAL | Status: AC
  Administered 2016-05-28: 400 mg via ORAL
  Filled 2016-05-28: qty 2

## 2016-05-28 MED ORDER — SODIUM CHLORIDE 0.9 % IV SOLN
INTRAVENOUS | Status: DC
  Administered 2016-05-28: 09:00:00 via INTRAVENOUS

## 2016-05-28 MED ORDER — ROPIVACAINE HCL 5 MG/ML IJ SOLN
INTRAMUSCULAR | Status: DC | PRN
  Administered 2016-05-28 (×3): 10 mL via PERINEURAL

## 2016-05-28 MED ORDER — ACETAMINOPHEN 500 MG PO TABS
1000.0000 mg | ORAL_TABLET | ORAL | Status: AC
  Administered 2016-05-28: 1000 mg via ORAL

## 2016-05-28 MED ORDER — CLINDAMYCIN PHOSPHATE 600 MG/50ML IV SOLN: 600.0000 mg | Freq: Once | INTRAVENOUS | Status: DC

## 2016-05-28 MED ORDER — MIDAZOLAM HCL 2 MG/2ML IJ SOLN
1.0000 mg | Freq: Once | INTRAMUSCULAR | Status: AC
  Administered 2016-05-28: 1 mg via INTRAVENOUS

## 2016-05-28 MED ORDER — GABAPENTIN 300 MG PO CAPS
300.0000 mg | ORAL_CAPSULE | ORAL | Status: AC
  Administered 2016-05-28: 300 mg via ORAL

## 2016-05-28 MED ORDER — BUPIVACAINE-EPINEPHRINE (PF) 0.25% -1:200000 IJ SOLN
INTRAMUSCULAR | Status: AC
  Filled 2016-05-28: qty 30

## 2016-05-28 MED ORDER — LIDOCAINE HCL (PF) 1 % IJ SOLN
INTRAMUSCULAR | Status: DC | PRN
  Administered 2016-05-28: 1 mL via INTRADERMAL

## 2016-05-28 MED ORDER — CEFAZOLIN SODIUM-DEXTROSE 2-4 GM/100ML-% IV SOLN: 2.0000 g | INTRAVENOUS | Status: DC

## 2016-05-28 MED ORDER — BUPIVACAINE-EPINEPHRINE (PF) 0.25% -1:200000 IJ SOLN
INTRAMUSCULAR | Status: DC | PRN
  Administered 2016-05-28: 20 mL via PERINEURAL
  Administered 2016-05-28: 40 mL via PERINEURAL

## 2016-05-28 MED ORDER — ROCURONIUM BROMIDE 100 MG/10ML IV SOLN
INTRAVENOUS | Status: DC | PRN
  Administered 2016-05-28: 35 mg via INTRAVENOUS
  Administered 2016-05-28 (×3): 10 mg via INTRAVENOUS
  Administered 2016-05-28: 5 mg via INTRAVENOUS

## 2016-05-28 MED ORDER — SUGAMMADEX SODIUM 200 MG/2ML IV SOLN
INTRAVENOUS | Status: DC | PRN
Start: 2016-05-28 — End: 2016-05-28
  Administered 2016-05-28: 149.6 mg via INTRAVENOUS

## 2016-05-28 MED ORDER — LIDOCAINE HCL (PF) 1 % IJ SOLN
INTRAMUSCULAR | Status: AC
  Filled 2016-05-28: qty 5

## 2016-05-28 MED ORDER — MELOXICAM 15 MG PO TABS: 15.0000 mg | ORAL_TABLET | Freq: Every day | ORAL | 3 refills | Status: DC

## 2016-05-28 MED ORDER — SUCCINYLCHOLINE CHLORIDE 20 MG/ML IJ SOLN
INTRAMUSCULAR | Status: DC | PRN
  Administered 2016-05-28: 100 mg via INTRAVENOUS

## 2016-05-28 MED ORDER — OXYCODONE HCL 5 MG/5ML PO SOLN: 5.0000 mg | Freq: Once | ORAL | Status: DC | PRN

## 2016-05-28 MED ORDER — CHLORHEXIDINE GLUCONATE CLOTH 2 % EX PADS: 6.0000 | MEDICATED_PAD | Freq: Once | CUTANEOUS | Status: DC

## 2016-05-28 MED ORDER — LIDOCAINE HCL (CARDIAC) 20 MG/ML IV SOLN
INTRAVENOUS | Status: DC | PRN
  Administered 2016-05-28: 40 mg via INTRAVENOUS

## 2016-05-28 MED ORDER — CELECOXIB 200 MG PO CAPS
400.0000 mg | ORAL_CAPSULE | ORAL | Status: AC
  Administered 2016-05-28: 400 mg via ORAL

## 2016-05-28 MED ORDER — GABAPENTIN 300 MG PO CAPS
ORAL_CAPSULE | ORAL | Status: AC
  Administered 2016-05-28: 300 mg via ORAL
  Filled 2016-05-28: qty 1

## 2016-05-28 MED ORDER — CLINDAMYCIN PHOSPHATE 600 MG/50ML IV SOLN
INTRAVENOUS | Status: AC
  Filled 2016-05-28: qty 50

## 2016-05-28 MED ORDER — ONDANSETRON HCL 4 MG/2ML IJ SOLN
INTRAMUSCULAR | Status: DC | PRN
  Administered 2016-05-28: 4 mg via INTRAVENOUS

## 2016-05-28 MED ORDER — MIDAZOLAM HCL 2 MG/2ML IJ SOLN
INTRAMUSCULAR | Status: DC | PRN
  Administered 2016-05-28 (×2): 1 mg via INTRAVENOUS

## 2016-05-28 MED ORDER — MIDAZOLAM HCL 5 MG/5ML IJ SOLN
INTRAMUSCULAR | Status: AC
  Administered 2016-05-28: 1 mg via INTRAVENOUS
  Filled 2016-05-28: qty 5

## 2016-05-28 MED ORDER — PROPOFOL 10 MG/ML IV BOLUS
INTRAVENOUS | Status: DC | PRN
  Administered 2016-05-28: 150 mg via INTRAVENOUS

## 2016-05-28 MED ORDER — ACETAMINOPHEN 500 MG PO TABS
ORAL_TABLET | ORAL | Status: AC
  Administered 2016-05-28: 1000 mg via ORAL
  Filled 2016-05-28: qty 2

## 2016-05-28 MED ORDER — ROPIVACAINE HCL 5 MG/ML IJ SOLN
INTRAMUSCULAR | Status: AC
  Filled 2016-05-28: qty 40

## 2016-05-28 MED ORDER — FENTANYL CITRATE (PF) 100 MCG/2ML IJ SOLN: 25.0000 ug | INTRAMUSCULAR | Status: DC | PRN

## 2016-05-28 MED ORDER — MORPHINE SULFATE (PF) 4 MG/ML IV SOLN
INTRAVENOUS | Status: AC
  Filled 2016-05-28: qty 1

## 2016-05-28 MED ORDER — MORPHINE SULFATE (PF) 4 MG/ML IV SOLN
INTRAVENOUS | Status: DC | PRN
  Administered 2016-05-28: 4 mg

## 2016-05-28 MED ORDER — EPINEPHRINE PF 1 MG/ML IJ SOLN
INTRAMUSCULAR | Status: DC | PRN
  Administered 2016-05-28: 8 mL

## 2016-05-28 MED ORDER — CEFAZOLIN SODIUM-DEXTROSE 2-4 GM/100ML-% IV SOLN
INTRAVENOUS | Status: AC
  Filled 2016-05-28: qty 100

## 2016-05-28 MED ORDER — NEOMYCIN-POLYMYXIN B GU 40-200000 IR SOLN
Status: DC | PRN
  Administered 2016-05-28: 2 mL

## 2016-05-28 MED ORDER — OXYCODONE HCL 5 MG PO TABS: 5.0000 mg | ORAL_TABLET | Freq: Once | ORAL | Status: DC | PRN

## 2016-05-28 MED ORDER — GABAPENTIN 400 MG PO CAPS: 400.0000 mg | ORAL_CAPSULE | Freq: Two times a day (BID) | ORAL | 3 refills | Status: DC

## 2016-05-28 MED ORDER — NEOMYCIN-POLYMYXIN B GU 40-200000 IR SOLN
Status: AC
  Filled 2016-05-28: qty 2

## 2016-05-28 SURGICAL SUPPLY — 66 items
ADAPTER IRRIG TUBE 2 SPIKE SOL (ADAPTER) ×2 IMPLANT
ANCHOR ALL-SUT Q-FIX 2.8 (Anchor) ×2 IMPLANT
ARTHROSCOPIC SHAVER BLADE- 5.5MM X 125 MM ×2 IMPLANT
BLADE AGGRESSIVE PLUS 4.0 (BLADE) ×2 IMPLANT
BLADE SURG SZ11 CARB STEEL (BLADE) ×2 IMPLANT
BUR AGGRESSIVE+ 5.5 (BURR) ×2 IMPLANT
BUR BR 5.5 12 FLUTE (BURR) ×2 IMPLANT
BUR RADIUS 4.0X18.5 (BURR) ×2 IMPLANT
BUR RADIUS 5.5 (BURR) ×2 IMPLANT
CANNULA 5.75X7 CRYSTAL CLEAR (CANNULA) ×2 IMPLANT
CANNULA 8.5X75 THRED (CANNULA) ×2 IMPLANT
CANNULA PARTIAL THREAD 2X7 (CANNULA) ×2 IMPLANT
CHLORAPREP W/TINT 26ML (MISCELLANEOUS) ×2 IMPLANT
CONNECTOR PERFECT PASSER (CONNECTOR) ×2 IMPLANT
COVER MAYO STAND STRL (DRAPES) ×2 IMPLANT
DRAPE IMP U-DRAPE 54X76 (DRAPES) ×2 IMPLANT
DRAPE SHEET LG 3/4 BI-LAMINATE (DRAPES) ×4 IMPLANT
DRAPE STERI 35X30 U-POUCH (DRAPES) ×2 IMPLANT
ELECTRODES -TENS PRODUCT ×4 IMPLANT
GAUZE PETRO XEROFOAM 1X8 (MISCELLANEOUS) ×2 IMPLANT
GAUZE SPONGE 4X4 12PLY STRL (GAUZE/BANDAGES/DRESSINGS) ×2 IMPLANT
GLOVE BIOGEL PI IND STRL 6.5 (GLOVE) ×1 IMPLANT
GLOVE BIOGEL PI INDICATOR 6.5 (GLOVE) ×1
GLOVE SURG ORTHO 8.0 STRL STRW (GLOVE) ×8 IMPLANT
GLOVE SURG SYN 6.5 ES PF (GLOVE) ×4 IMPLANT
GOWN STRL REUS W/ TWL LRG LVL4 (GOWN DISPOSABLE) ×3 IMPLANT
GOWN STRL REUS W/TWL LRG LVL4 (GOWN DISPOSABLE) ×3
IV LACTATED RINGER IRRG 3000ML (IV SOLUTION) ×8
IV LR IRRIG 3000ML ARTHROMATIC (IV SOLUTION) ×8 IMPLANT
KIT RM TURNOVER STRD PROC AR (KITS) ×2 IMPLANT
KIT SHOULDER TRACTION (DRAPES) ×2 IMPLANT
KIT SUTURE 2.8 Q-FIX DISP (MISCELLANEOUS) ×2 IMPLANT
MANIFOLD NEPTUNE II (INSTRUMENTS) ×2 IMPLANT
MAT BLUE FLOOR 46X72 FLO (MISCELLANEOUS) ×2 IMPLANT
MULTI S KNOTLES FIXATION 5.5 (Orthopedic Implant) ×4 IMPLANT
NDL SAFETY 18GX1.5 (NEEDLE) ×2 IMPLANT
NEEDLE SPNL 18GX3.5 QUINCKE PK (NEEDLE) ×2 IMPLANT
NS IRRIG 500ML POUR BTL (IV SOLUTION) ×2 IMPLANT
PACK ARTHROSCOPY SHOULDER (MISCELLANEOUS) ×2 IMPLANT
PASSER SUT CAPTURE FIRST (SUTURE) ×2 IMPLANT
SET TUBE SUCT SHAVER OUTFL 24K (TUBING) ×2 IMPLANT
SET TUBE TIP INTRA-ARTICULAR (MISCELLANEOUS) ×2 IMPLANT
SLING ULTRA II LG (MISCELLANEOUS) ×2 IMPLANT
SOL PREP PVP 2OZ (MISCELLANEOUS) ×2
SOLUTION PREP PVP 2OZ (MISCELLANEOUS) ×1 IMPLANT
STAPLER SKIN PROX 35W (STAPLE) ×2 IMPLANT
SUT ETHILON 3 0 FSLX (SUTURE) ×2 IMPLANT
SUT PDS PLUS 0 (SUTURE) ×1
SUT PDS PLUS AB 0 CT-2 (SUTURE) ×1 IMPLANT
SUT PERFECTPASSER WHITE CART (SUTURE) ×6 IMPLANT
SUT SMART STITCH CARTRIDGE (SUTURE) ×2 IMPLANT
SUT VIC AB 0 CT1 36 (SUTURE) ×2 IMPLANT
SUT VIC AB 2-0 CT1 27 (SUTURE) ×1
SUT VIC AB 2-0 CT1 TAPERPNT 27 (SUTURE) ×1 IMPLANT
SUT VIC AB 2-0 CT2 27 (SUTURE) ×2 IMPLANT
SUT VICRYL 3-0 27IN (SUTURE) ×2 IMPLANT
SUTURE MAGNUM WIRE 2X48 BLK (SUTURE) ×2 IMPLANT
SYR 20CC LL (SYRINGE) ×2 IMPLANT
SYR 30ML LL (SYRINGE) ×2 IMPLANT
SYR 50ML LL SCALE MARK (SYRINGE) ×2 IMPLANT
SYR TB 1ML 27GX1/2 LL (SYRINGE) ×2 IMPLANT
TUBING ARTHRO INFLOW-ONLY STRL (TUBING) ×2 IMPLANT
TUBING CONNECTING 10 (TUBING) ×2 IMPLANT
WAND COBLATION FLOW 50 (SURGICAL WAND) ×2 IMPLANT
WAND HAND CNTRL MULTIVAC 90 (MISCELLANEOUS) ×2 IMPLANT
WIRE MAGNUM (SUTURE) ×4 IMPLANT

## 2016-05-28 NOTE — Anesthesia Postprocedure Evaluation (Signed)
Anesthesia Post Note  Patient: Amanda RocherRebecca B Lyons  Procedure(s) Performed: Procedure(s) (LRB): SHOULDER ARTHROSCOPY WITH OPEN ROTATOR CUFF REPAIR (Right)  Patient location during evaluation: PACU Anesthesia Type: General Level of consciousness: awake and alert Pain management: pain level controlled Vital Signs Assessment: post-procedure vital signs reviewed and stable Respiratory status: spontaneous breathing, nonlabored ventilation, respiratory function stable and patient connected to nasal cannula oxygen Cardiovascular status: blood pressure returned to baseline and stable Postop Assessment: no signs of nausea or vomiting Anesthetic complications: no    Last Vitals:  Vitals:   05/28/16 1426 05/28/16 1447  BP: 128/66 120/66  Pulse: 86 84  Resp: 16 16  Temp: 36.6 C 36.8 C    Last Pain:  Vitals:   05/28/16 1447  TempSrc: Temporal  PainSc:                  Cleda MccreedyJoseph K Lesli Issa

## 2016-05-28 NOTE — Anesthesia Procedure Notes (Addendum)
Anesthesia Regional Block:  Interscalene brachial plexus block  Pre-Anesthetic Checklist: ,, timeout performed, Correct Patient, Correct Site, Correct Laterality, Correct Procedure, Correct Position, site marked, Risks and benefits discussed,  Surgical consent,  Pre-op evaluation,  At surgeon's request and post-op pain management  Laterality: Upper and Right  Prep: chloraprep       Needles:  Injection technique: Single-shot  Needle Type: Stimiplex     Needle Length: 5cm 5 cm Needle Gauge: 22 and 22 G    Additional Needles:  Procedures: ultrasound guided (picture in chart) Interscalene brachial plexus block Narrative:  Start time: 05/28/2016 8:47 AM End time: 05/28/2016 8:49 AM Injection made incrementally with aspirations every 5 mL.  Performed by: Personally  Anesthesiologist: Margorie JohnPISCITELLO, Hazely Sealey K  Additional Notes: Patient endorses baseline weakness in operative shoulder and arm  Functioning IV was confirmed and monitors were applied.  A 50mm 22ga Stimuplex needle was used. Sterile prep,hand hygiene and sterile gloves were used.  Negative aspiration and negative test dose prior to incremental administration of local anesthetic. The patient tolerated the procedure well with no immediate complications.

## 2016-05-28 NOTE — Anesthesia Preprocedure Evaluation (Signed)
Anesthesia Evaluation  Patient identified by MRN, date of birth, ID band Patient awake    Reviewed: Allergy & Precautions, H&P , NPO status , Patient's Chart, lab work & pertinent test results  History of Anesthesia Complications Negative for: history of anesthetic complications  Airway Mallampati: III  TM Distance: <3 FB Neck ROM: full    Dental no notable dental hx. (+) Poor Dentition, Chipped   Pulmonary neg shortness of breath, asthma ,    Pulmonary exam normal breath sounds clear to auscultation       Cardiovascular Exercise Tolerance: Good hypertension, (-) angina(-) DOE Normal cardiovascular exam+ dysrhythmias  Rhythm:regular Rate:Normal     Neuro/Psych PSYCHIATRIC DISORDERS Anxiety  Neuromuscular disease    GI/Hepatic Neg liver ROS, GERD  Controlled,  Endo/Other  diabetes, Type 2  Renal/GU      Musculoskeletal   Abdominal   Peds  Hematology negative hematology ROS (+)   Anesthesia Other Findings Past Medical History: No date: Anxiety No date: Asthma 11/08/2013: Chest pain No date: Diabetes mellitus without complication (HCC) 11/08/2013: Dyslipidemia No date: Dysrhythmia     Comment: fast No date: GERD (gastroesophageal reflux disease) No date: History of anxiety     Comment: had anxiety attacks No date: History of kidney stones 05/02/2013: HNP (herniated nucleus pulposus), lumbar No date: Hypertension No date: Trigeminal neuralgia  Past Surgical History: No date: ABDOMINAL HYSTERECTOMY No date: BRAIN SURGERY     Comment: for trigeminal neuralgia No date: CARDIAC CATHETERIZATION 1999: gamma knife     Comment: for trigeminal neuralgia 10/11/2014: LEFT HEART CATHETERIZATION WITH CORONARY ANGIO* N/A     Comment: Procedure: LEFT HEART CATHETERIZATION WITH               CORONARY ANGIOGRAM;  Surgeon: Lesleigh NoeHenry W Smith               III, MD;  Location: [facility] CATH LAB;  Service:               Cardiovascular;   Laterality: N/A; 04/19/2003: LUMBAR LAMINECTOMY/DECOMPRESSION MICRODISCECTO*     Comment: Right L5-S1 intralaminar laminotomy for               excision of herniated disk with the operating               microscope 05/02/2013: LUMBAR LAMINECTOMY/DECOMPRESSION MICRODISCECTO* Right     Comment: Procedure: Right Lumbar Five Sacral One               Microdiskectomy;  Surgeon: Temple PaciniHenry A Pool, MD;                Location: [facility] NEURO ORS;  Service: Neurosurgery;              Laterality: Right;  LUMBAR               LAMINECTOMY/DECOMPRESSION MICRODISCECTOMY 1               LEVEL     Reproductive/Obstetrics negative OB ROS                             Anesthesia Physical Anesthesia Plan  ASA: III  Anesthesia Plan: General ETT   Post-op Pain Management:  Regional for Post-op pain   Induction:   Airway Management Planned:   Additional Equipment:   Intra-op Plan:   Post-operative Plan:   Informed Consent: I have reviewed the patients History and Physical, chart, labs and discussed the procedure  including the risks, benefits and alternatives for the proposed anesthesia with the patient or authorized representative who has indicated his/her understanding and acceptance.     Plan Discussed with: Anesthesiologist, CRNA and Surgeon  Anesthesia Plan Comments: (Patient consented that due to her pulmonary status that there is a slight increase risk of her needing to be admitted for observation 2/2 the nerve block.  Patient voiced understanding.)        Anesthesia Quick Evaluation

## 2016-05-28 NOTE — Anesthesia Procedure Notes (Signed)
Procedure Name: Intubation Date/Time: 05/28/2016 9:15 AM Performed by: Henrietta HooverPOPE, Eluterio Seymour Pre-anesthesia Checklist: Patient identified, Emergency Drugs available, Suction available, Patient being monitored and Timeout performed Patient Re-evaluated:Patient Re-evaluated prior to inductionOxygen Delivery Method: Circle system utilized Preoxygenation: Pre-oxygenation with 100% oxygen Intubation Type: IV induction Ventilation: Mask ventilation without difficulty Laryngoscope Size: Mac and 3 Grade View: Grade II Tube type: Oral Tube size: 7.0 mm Number of attempts: 1 Airway Equipment and Method: Stylet Placement Confirmation: ETT inserted through vocal cords under direct vision,  positive ETCO2 and breath sounds checked- equal and bilateral Secured at: 21 cm Tube secured with: Tape Dental Injury: Teeth and Oropharynx as per pre-operative assessment  Difficulty Due To: Difficulty was anticipated and Difficult Airway- due to anterior larynx Comments: Had McGrath in room due to limited thyro-mental distance.  Did not use.  Used Succinylcholine and MAC 3, stylet.  View not bad.

## 2016-05-28 NOTE — Transfer of Care (Signed)
Immediate Anesthesia Transfer of Care Note  Patient: Amanda Lyons  Procedure(s) Performed: Procedure(s): SHOULDER ARTHROSCOPY WITH OPEN ROTATOR CUFF REPAIR (Right)  Patient Location: PACU  Anesthesia Type:General  Level of Consciousness: sedated  Airway & Oxygen Therapy: Patient Spontanous Breathing and Patient connected to face mask oxygen  Post-op Assessment: Report given to RN and Post -op Vital signs reviewed and stable  Post vital signs: Reviewed and stable  Last Vitals:  Vitals:   05/28/16 0905 05/28/16 1230  BP:  120/66  Pulse:  94  Resp: 20 15  Temp:  (!) 36 C    Last Pain:  Vitals:   05/28/16 1230  TempSrc:   PainSc: (P) Asleep         Complications: No apparent anesthesia complications

## 2016-05-28 NOTE — OR Nursing (Signed)
Right upper extremity warm, patient able to move fingers and cap refill<3 secs

## 2016-05-28 NOTE — Op Note (Signed)
05/28/2016  12:15 PM  PATIENT:  Amanda Lyons    PRE-OPERATIVE DIAGNOSIS:  Large right rotator cuff tear, rupture of biceps tendon, severe acromioclavicular joint arthritis, severe subacromial impingement  POST-OPERATIVE DIAGNOSIS:  Same  PROCEDURE:  SHOULDER ARTHROSCOPY WITH OPEN ROTATOR CUFF REPAIR, debridement of biceps tendon, excision of distal clavicle, and subacromial decompression with a partial acromioplasty  SURGEON:  Valinda Hoar, MD   .  ANESTHESIA:   General plus interscalene block  PREOPERATIVE INDICATIONS:  Amanda Lyons is a  60 y.o. female with a diagnosis of M75.41 Impingement syndrome of right shoulder who failed conservative measures and elected for surgical management.    The risks benefits and alternatives were discussed with the patient preoperatively including but not limited to the risks of infection, bleeding, nerve injury, cardiopulmonary complications, the need for revision surgery, among others, and the patient was willing to proceed.  OPERATIVE IMPLANTS: One Q fix anchor and 2 [employer] multi fix anchors 6 Magnum wire suture  OPERATIVE FINDINGS: The patient had severe subacromial bursitis and impingement. The anterior acromion was prominent. The ACjoint was arthritic. The glenohumeral joint was intact.  The biceps tendon was already ruptured with a frayed stump.  The labrum was minimally frayed.  There was a large U-shaped tear starting at the biceps tendon anteriorly and extending posteriorly to the teres minor.  OPERATIVE PROCEDURE: The patient was brought to the operating room where satisfactory general endotracheal and interscalene anesthesia were accomplished.The patient was turned into the lateral decubitus position and the shoulder was prepped and draped in a sterile fashion. Arthroscopy was carried out from a posterior portal with accessory portals laterally and anteriorly. The  joint was examined first. The above findings were encountered.  The motorized shaver was introduced anteriorly and the undersurface of the rotator cuff probed and lightly debrided. The biceps tendon was already ruptured.  The stump was debrided.  The labrum was trimmed up with the ArthroCare wand as well. The arthroscope was redirected into the subacromial space. There was severe bursitis which was resected with the motorized shaver and ArthroCare wand. The large bur was introduced from a posterior portal and the anterior acromion was debrided. The undersurface of the clavicle was debrided with the bur which was then reintroduced from an anterior portal and the remaining distal clavicle completely excised.  The character of the cuff tear was examined with a probe and tissue graspers.  The posterior portion of the cuff appeared to be in several layers.  I elected to perform a mini open procedure at that point in time as the safest course of action.  The lateral portal was extended anteriorly and posteriorly.  Subcutaneous tissue was elevated off the fascia which was incised bluntly with a finger through the already created portal in the muscle.  There was still a great deal of bursal tissue present which was removed.  This helped characterize the limbs of the cuff better.  A Q-fix anchor was deployed medially just outside the articular surface of the humeral head.  Both sutures were then passed up through the cuff medially and tagged.  2  anchor holes were then made laterally in the humeral head.  4 Magnum wire sutures were then passed through the more lateral portion of the cuff using the perfect passer.  Traction was decreased to 5 pounds and the 2 posterior sutures were passed through a multi fix anchor, which was then introduced into the posterior hole and deployed fully.  This brought  the posterior half of the cuff out laterally quite nicely.  The anterior 2 sutures were then passed through another multi fix anchor, which was deployed in its anterior hole, completing the  repair laterally.  The entire humeral head footprint was covered.  The 2 medial sutures were then tied over the cuff and cut.  Internal and external rotation was excellent and the complete closure was obtained.  The wound was irrigated.   The joint was flushed and the wounds closed with 0 and 2-0 Vicryl with staples on the skin.. 1/4% Marcaine with morphine was injected into the shoulder. Sponge and needle counts were correct.   The dry sterile dressing and was applied along with a TENS unit and padded sling. Patient was awakened and taken recovery in good condition.   Amanda Lyons M.D.

## 2016-05-28 NOTE — Discharge Instructions (Signed)
AMBULATORY SURGERY  °DISCHARGE INSTRUCTIONS ° ° °1) The drugs that you were given will stay in your system until tomorrow so for the next 24 hours you should not: ° °A) Drive an automobile °B) Make any legal decisions °C) Drink any alcoholic beverage ° ° °2) You may resume regular meals tomorrow.  Today it is better to start with liquids and gradually work up to solid foods. ° °You may eat anything you prefer, but it is better to start with liquids, then soup and crackers, and gradually work up to solid foods. ° ° °3) Please notify your doctor immediately if you have any unusual bleeding, trouble breathing, redness and pain at the surgery site, drainage, fever, or pain not relieved by medication. ° ° ° °4) Additional Instructions: ° ° ° ° ° ° ° °Please contact your physician with any problems or Same Day Surgery at [phone], Monday through Friday 6 am to 4 pm, or [place] [facility] [place] [facility] number at [phone]. °

## 2016-05-28 NOTE — H&P (Signed)
THE PATIENT WAS SEEN PRIOR TO SURGERY TODAY.  HISTORY, ALLERGIES, HOME MEDICATIONS AND OPERATIVE PROCEDURE WERE REVIEWED. RISKS AND BENEFITS OF SURGERY DISCUSSED WITH PATIENT AGAIN.  NO CHANGES FROM INITIAL HISTORY AND PHYSICAL NOTED.    

## 2016-05-28 NOTE — OR Nursing (Signed)
TENS unit rep talked with family and gave instructions and contact numbers in case they have questions.

## 2016-06-02 ENCOUNTER — Encounter: Payer: Self-pay | Admitting: Specialist

## 2016-06-24 ENCOUNTER — Other Ambulatory Visit: Payer: Self-pay

## 2016-06-24 NOTE — Telephone Encounter (Signed)
I don't think we prescribe the omeprazole for her We'll encourage her to contact the prescriber to see if this is still appropriate Also, please run through her medicine list She has meloxicam in there twice and hydrocodone and oxycodone I think Dr. Hyacinth MeekerMiller has prescribed the most recent meloxicam, so mine can be removed I believe The oxycodone Rx is from 2015; please make sure she is NOT taking that plus hydrocodone Thank you

## 2016-06-25 NOTE — Telephone Encounter (Signed)
Left voice mail

## 2016-07-15 ENCOUNTER — Ambulatory Visit: Payer: No Typology Code available for payment source | Admitting: Family Medicine

## 2016-07-18 ENCOUNTER — Ambulatory Visit: Payer: No Typology Code available for payment source | Admitting: Family Medicine

## 2016-07-19 ENCOUNTER — Other Ambulatory Visit: Payer: Self-pay | Admitting: Pulmonary Disease

## 2016-07-21 NOTE — Telephone Encounter (Signed)
I will sign this refill. From now on, this should be filled by her primary care MD  Theodoro Gristave

## 2016-07-21 NOTE — Telephone Encounter (Signed)
Received refill request for losartan 100. Last prescribed 10-02-15 with 2 refills. Pt last seen in office on 11/13/15 with no pending appointments.   DS please advise on refill. Thanks

## 2016-07-27 ENCOUNTER — Other Ambulatory Visit: Payer: Self-pay | Admitting: Family Medicine

## 2016-08-12 ENCOUNTER — Other Ambulatory Visit: Payer: Self-pay | Admitting: Family Medicine

## 2016-08-12 NOTE — Telephone Encounter (Signed)
Pt needs refill on Metoprolol to be sent to [facility] [address]

## 2016-08-13 ENCOUNTER — Other Ambulatory Visit: Payer: Self-pay | Admitting: Family Medicine

## 2016-08-13 MED ORDER — METOPROLOL SUCCINATE ER 25 MG PO TB24: 25.0000 mg | ORAL_TABLET | Freq: Every day | ORAL | 1 refills | Status: DC

## 2016-08-13 NOTE — Telephone Encounter (Signed)
rx already sent. 

## 2016-09-08 ENCOUNTER — Emergency Department
Admission: EM | Admit: 2016-09-08 | Discharge: 2016-09-09 | Disposition: A | Discharge status: Home / Self Care | Payer: Managed Care, Other (non HMO) | Attending: Emergency Medicine | Admitting: Emergency Medicine

## 2016-09-08 ENCOUNTER — Encounter: Payer: Self-pay | Admitting: Emergency Medicine

## 2016-09-08 ENCOUNTER — Telehealth: Payer: Self-pay | Admitting: Family Medicine

## 2016-09-08 ENCOUNTER — Emergency Department: Payer: Managed Care, Other (non HMO)

## 2016-09-08 DIAGNOSIS — R0789 Other chest pain: Secondary | ICD-10-CM | POA: Insufficient documentation

## 2016-09-08 DIAGNOSIS — R0609 Other forms of dyspnea: Secondary | ICD-10-CM

## 2016-09-08 DIAGNOSIS — Z7982 Long term (current) use of aspirin: Secondary | ICD-10-CM | POA: Insufficient documentation

## 2016-09-08 DIAGNOSIS — E119 Type 2 diabetes mellitus without complications: Secondary | ICD-10-CM | POA: Insufficient documentation

## 2016-09-08 DIAGNOSIS — J45909 Unspecified asthma, uncomplicated: Secondary | ICD-10-CM | POA: Diagnosis not present

## 2016-09-08 DIAGNOSIS — R0602 Shortness of breath: Secondary | ICD-10-CM | POA: Diagnosis present

## 2016-09-08 DIAGNOSIS — I10 Essential (primary) hypertension: Secondary | ICD-10-CM | POA: Insufficient documentation

## 2016-09-08 DIAGNOSIS — Z7984 Long term (current) use of oral hypoglycemic drugs: Secondary | ICD-10-CM | POA: Diagnosis not present

## 2016-09-08 DIAGNOSIS — Z79899 Other long term (current) drug therapy: Secondary | ICD-10-CM | POA: Diagnosis not present

## 2016-09-08 LAB — BASIC METABOLIC PANEL
Anion gap: 9 (ref 5–15)
BUN: 20 mg/dL (ref 6–20)
CO2: 29 mmol/L (ref 22–32)
Calcium: 10.3 mg/dL (ref 8.9–10.3)
Chloride: 101 mmol/L (ref 101–111)
Creatinine, Ser: 0.66 mg/dL (ref 0.44–1.00)
Glucose, Bld: 123 mg/dL — ABNORMAL HIGH (ref 65–99)
POTASSIUM: 4.1 mmol/L (ref 3.5–5.1)
SODIUM: 139 mmol/L (ref 135–145)

## 2016-09-08 LAB — CBC
HEMATOCRIT: 40.6 % (ref 35.0–47.0)
Hemoglobin: 13.6 g/dL (ref 12.0–16.0)
MCH: 29.2 pg (ref 26.0–34.0)
MCHC: 33.6 g/dL (ref 32.0–36.0)
MCV: 86.9 fL (ref 80.0–100.0)
Platelets: 361 10*3/uL (ref 150–440)
RBC: 4.67 MIL/uL (ref 3.80–5.20)
RDW: 14.3 % (ref 11.5–14.5)
WBC: 9.4 10*3/uL (ref 3.6–11.0)

## 2016-09-08 LAB — FIBRIN DERIVATIVES D-DIMER (ARMC ONLY): FIBRIN DERIVATIVES D-DIMER (ARMC): 666 — AB (ref 0–499)

## 2016-09-08 LAB — TROPONIN I: Troponin I: <0.03 ng/mL (ref <0.03)

## 2016-09-08 LAB — BRAIN NATRIURETIC PEPTIDE: B NATRIURETIC PEPTIDE 5: 40 pg/mL (ref 0.0–100.0)

## 2016-09-08 MED ORDER — IOPAMIDOL (ISOVUE-370) INJECTION 76%
75.0000 mL | Freq: Once | INTRAVENOUS | Status: AC | PRN
  Administered 2016-09-08: 75 mL via INTRAVENOUS

## 2016-09-08 MED ORDER — IPRATROPIUM-ALBUTEROL 0.5-2.5 (3) MG/3ML IN SOLN
3.0000 mL | Freq: Once | RESPIRATORY_TRACT | Status: AC
  Administered 2016-09-08: 3 mL via RESPIRATORY_TRACT

## 2016-09-08 MED ORDER — IPRATROPIUM-ALBUTEROL 0.5-2.5 (3) MG/3ML IN SOLN
RESPIRATORY_TRACT | Status: AC
  Filled 2016-09-08: qty 3

## 2016-09-08 NOTE — ED Provider Notes (Signed)
[facility] Emergency Department Provider Note   ____________________________________________   I have reviewed the triage vital signs and the nursing notes.   HISTORY  Chief Complaint Shortness of Breath   History limited by: Not Limited   HPI Amanda Lyons is a 61 y.o. female who presents to the emergency department today because of concern for shortness of breath. The shortness of breath has been present for about one week. It is worse with exertion. The patient also develops some chest pressure with the shortness of breath. In addition the patient has noticed that her ankles have begun to swell up in the evenings. In the mornings they are better. The patient denies any fevers. No cough.   Past Medical History:  Diagnosis Date  . Anxiety   . Asthma   . Chest pain 11/08/2013  . Diabetes mellitus without complication (HCC)   . Dyslipidemia 11/08/2013  . Dysrhythmia    fast  . GERD (gastroesophageal reflux disease)   . History of anxiety    had anxiety attacks  . History of kidney stones   . HNP (herniated nucleus pulposus), lumbar 05/02/2013  . Hypertension   . Trigeminal neuralgia     Patient Active Problem List   Diagnosis Date Noted  . Visit for screening mammogram 04/23/2016  . Impingement syndrome of right shoulder 04/12/2016  . Medication monitoring encounter 01/14/2016  . Chronic right hip pain 10/26/2015  . Groin pain, chronic, right 10/26/2015  . Bronchiolitis 07/12/2015  . Chronic bronchitis (HCC) 07/10/2015  . Vitamin D deficiency 07/10/2015  . Dyshidrotic eczema 05/30/2015  . Type 2 diabetes mellitus (HCC) 05/30/2015  . Vaginal spotting 05/30/2015  . Neck mass 05/02/2015  . Hoarseness of voice 05/02/2015  . Cough 05/02/2015  . Dysphagia 05/02/2015  . Overweight (BMI 25.0-29.9) 02/06/2015  . Dyslipidemia 11/08/2013  . Hypertension   . GERD (gastroesophageal reflux disease)   . HNP (herniated nucleus pulposus), lumbar  05/02/2013    Past Surgical History:  Procedure Laterality Date  . ABDOMINAL HYSTERECTOMY    . BRAIN SURGERY     for trigeminal neuralgia  . CARDIAC CATHETERIZATION    . gamma knife  1999   for trigeminal neuralgia  . LEFT HEART CATHETERIZATION WITH CORONARY ANGIOGRAM N/A 10/11/2014   Procedure: LEFT HEART CATHETERIZATION WITH CORONARY ANGIOGRAM;  Surgeon: Lesleigh Noe, MD;  Location: [facility] CATH LAB;  Service: Cardiovascular;  Laterality: N/A;  . LUMBAR LAMINECTOMY/DECOMPRESSION MICRODISCECTOMY  04/19/2003   Right L5-S1 intralaminar laminotomy for excision of herniated disk with the operating microscope  . LUMBAR LAMINECTOMY/DECOMPRESSION MICRODISCECTOMY Right 05/02/2013   Procedure: Right Lumbar Five Sacral One Microdiskectomy;  Surgeon: Temple Pacini, MD;  Location: [facility] ORS;  Service: Neurosurgery;  Laterality: Right;  LUMBAR LAMINECTOMY/DECOMPRESSION MICRODISCECTOMY 1 LEVEL  . SHOULDER ARTHROSCOPY WITH OPEN ROTATOR CUFF REPAIR Right 05/28/2016   Procedure: SHOULDER ARTHROSCOPY WITH OPEN ROTATOR CUFF REPAIR;  Surgeon: Deeann Saint, MD;  Location: [facility] ORS;  Service: Orthopedics;  Laterality: Right;    Prior to Admission medications   Medication Sig Start Date End Date Taking? Authorizing Provider  albuterol (PROVENTIL HFA;VENTOLIN HFA) 108 (90 Base) MCG/ACT inhaler Inhale 2 puffs into the lungs every 6 (six) hours as needed for wheezing or shortness of breath. 10/03/15   Merwyn Katos, MD  amLODipine (NORVASC) 10 MG tablet TAKE 1 TABLET (10 MG TOTAL) BY MOUTH DAILY. 02/09/16   Kerman Passey, MD  aspirin EC 81 MG EC tablet Take 1 tablet (81 mg  total) by mouth daily. 11/09/13   Leroy Sea, MD  atorvastatin (LIPITOR) 10 MG tablet Take 1 tablet (10 mg total) by mouth at bedtime. 03/12/16   Kerman Passey, MD  cholecalciferol (VITAMIN D) 1000 units tablet Take 1,000 Units by mouth daily.    Historical Provider, MD  Cyanocobalamin (VITAMIN B-12 ER PO) Take by mouth daily.    Historical  Provider, MD  fluticasone furoate-vilanterol (BREO ELLIPTA) 100-25 MCG/INH AEPB Inhale 1 puff into the lungs daily. 03/11/16   Merwyn Katos, MD  gabapentin (NEURONTIN) 400 MG capsule Take 1 capsule (400 mg total) by mouth 2 (two) times daily. 05/28/16   Deeann Saint, MD  glucose blood (BAYER CONTOUR TEST) test strip Use as instructed daily; LON 6 months, E11.9 08/08/15   Kerman Passey, MD  HYDROcodone-acetaminophen (NORCO) 7.5-325 MG tablet Take 1 tablet by mouth every 6 (six) hours as needed for moderate pain. 05/28/16   Deeann Saint, MD  losartan-hydrochlorothiazide (HYZAAR) 100-12.5 MG tablet TAKE 1 TABLET BY MOUTH DAILY. 07/21/16   Merwyn Katos, MD  meloxicam (MOBIC) 15 MG tablet Take 1 tablet (15 mg total) by mouth daily. 05/28/16   Deeann Saint, MD  metFORMIN (GLUCOPHAGE-XR) 500 MG 24 hr tablet Take 1 tablet (500 mg total) by mouth daily with supper. 05/23/16   Kerman Passey, MD  metoprolol succinate (TOPROL-XL) 25 MG 24 hr tablet Take 1 tablet (25 mg total) by mouth daily. 08/13/16   Kerman Passey, MD  nitroGLYCERIN (NITROSTAT) 0.4 MG SL tablet Place 1 tablet (0.4 mg total) under the tongue every 5 (five) minutes as needed for chest pain (CP or SOB). 11/09/13   Leroy Sea, MD  omeprazole (PRILOSEC) 40 MG capsule 40 mg daily. 05/03/15   Historical Provider, MD  oxyCODONE-acetaminophen (PERCOCET/ROXICET) 5-325 MG per tablet Take 0.5 tablets by mouth daily as needed for moderate pain or severe pain (back pain from recent surgery).    Historical Provider, MD    Allergies Prednisone and Sulfa antibiotics  Family History  Problem Relation Age of Onset  . CVA Father   . Heart failure Father   . Heart disease Father   . Diabetes Father   . Hypertension Father   . Ovarian cancer Mother     early 8's  . Heart failure Maternal Grandmother   . Heart attack Maternal Grandfather   . Diabetes Sister   . Hypertension Sister     Social History Social History  Substance Use Topics   . Smoking status: Never Smoker  . Smokeless tobacco: Never Used  . Alcohol use No    Review of Systems  Constitutional: Negative for fever. Cardiovascular: Positive for chest pressure. Respiratory: Positive for shortness of breath. Gastrointestinal: Negative for abdominal pain, vomiting and diarrhea. Genitourinary: Negative for dysuria. Musculoskeletal: Negative for back pain. Skin: Negative for rash. Neurological: Negative for headaches, focal weakness or numbness.  10-point ROS otherwise negative.  ____________________________________________   PHYSICAL EXAM:  VITAL SIGNS: ED Triage Vitals  Enc Vitals Group     BP 09/08/16 1836 (!) 157/99     Pulse Rate 09/08/16 1836 86     Resp 09/08/16 1836 18     Temp 09/08/16 1836 98 F (36.7 C)     Temp Source 09/08/16 1836 Oral     SpO2 09/08/16 1836 97 %     Weight 09/08/16 1839 167 lb (75.8 kg)     Height 09/08/16 1839 5\' 2"  (1.575 m)     Head  Circumference --      Peak Flow --      Pain Score 09/08/16 1839 5    Constitutional: Alert and oriented. Well appearing and in no distress. Eyes: Conjunctivae are normal. Normal extraocular movements. ENT   Head: Normocephalic and atraumatic.   Nose: No congestion/rhinnorhea.   Mouth/Throat: Mucous membranes are moist.   Neck: No stridor. Hematological/Lymphatic/Immunilogical: No cervical lymphadenopathy. Cardiovascular: Normal rate, regular rhythm.  No murmurs, rubs, or gallops.  Respiratory: Normal respiratory effort without tachypnea nor retractions. Breath sounds are clear and equal bilaterally. No wheezes/rales/rhonchi. Gastrointestinal: Soft and non tender. No rebound. No guarding.  Genitourinary: Deferred Musculoskeletal: Normal range of motion in all extremities. No lower extremity edema. Neurologic:  Normal speech and language. No gross focal neurologic deficits are appreciated.  Skin:  Skin is warm, dry and intact. No rash noted. Psychiatric: Mood and  affect are normal. Speech and behavior are normal. Patient exhibits appropriate insight and judgment.  ____________________________________________    LABS (pertinent positives/negatives)  Labs Reviewed  BASIC METABOLIC PANEL - Abnormal; Notable for the following:       Result Value   Glucose, Bld 123 (*)    All other components within normal limits  FIBRIN DERIVATIVES D-DIMER ([facility] ONLY) - Abnormal; Notable for the following:    Fibrin derivatives D-dimer ([facility]) 666 (*)    All other components within normal limits  CBC  TROPONIN I  BRAIN NATRIURETIC PEPTIDE     ____________________________________________   EKG  I, Phineas SemenGraydon Lewie Deman, attending physician, personally viewed and interpreted this EKG  EKG Time: 1845 Rate: 75 Rhythm: normal sinus rhythm Axis: normal Intervals: qtc 402 QRS: narrow ST changes: no st elevation Impression: normal ekg   ____________________________________________    RADIOLOGY  CXR IMPRESSION: No active cardiopulmonary disease.  ____________________________________________   PROCEDURES  Procedures  ____________________________________________   INITIAL IMPRESSION / ASSESSMENT AND PLAN / ED COURSE  Pertinent labs & imaging results that were available during my care of the patient were reviewed by me and considered in my medical decision making (see chart for details).  Patient presented to the emergency department today because of concerns for shortness of breath. She has had some associated chest pressure. D-dimer was elevated. Will get a CT angiogram to evaluate for PE.  ____________________________________________   FINAL CLINICAL IMPRESSION(S) / ED DIAGNOSES  Final diagnoses:  SOB (shortness of breath)     Note: This dictation was prepared with Dragon dictation. Any transcriptional errors that result from this process are unintentional     Phineas SemenGraydon Roseann Kees, MD 09/08/16 2246

## 2016-09-08 NOTE — Telephone Encounter (Signed)
I spoke with patient about her symptoms; I recommended she go to urgent care; not sure if this is her heart, kidney problem, blood clot, but she needs to get checked out, can't wait until appt on Thursday; she agrees

## 2016-09-08 NOTE — ED Notes (Addendum)
Report off to Schering-PloughLaura rn.   Pt moved to room 18 from cpod.

## 2016-09-08 NOTE — ED Notes (Addendum)
Upon assessment pt is sob from walk from lobby to room. Pt able to communicate and an o2 reading of 100%. Pt also reports swelling in both ankles.

## 2016-09-08 NOTE — ED Triage Notes (Signed)
States has had SOB x 1 week, 2 days ago noted bilateral ankle edema. SOB with any walking.

## 2016-09-08 NOTE — Telephone Encounter (Signed)
Patient called stating that she has been experiencing swelling of her feet and ankles since Friday, 09/05/16.  Patient stated that she is also experiencing shortness of breath.

## 2016-09-09 NOTE — ED Provider Notes (Signed)
-----------------------------------------   12:24 AM on 09/09/2016 -----------------------------------------   Blood pressure (!) 156/83, pulse 72, temperature 98 F (36.7 C), temperature source Oral, resp. rate 20, height 5\' 2"  (1.575 m), weight 167 lb (75.8 kg), SpO2 100 %.  Assuming care from Dr. Derrill KayGoodman.  In short, Amanda RocherRebecca B Lyons is a 61 y.o. female with a chief complaint of Shortness of Breath .  Refer to the original H&P for additional details.  The current plan of care is to follow up the results of the CTA.   Clinical Course as of Sep 09 22  Tue Sep 09, 2016  0010 1. No evidence of pulmonary embolus. 2. Minimal bibasilar atelectasis noted. Lungs otherwise clear. 3. Borderline cardiomegaly.   CT Angio Chest PE W and/or Wo Contrast [AW]    Clinical Course User Index [AW] Rebecka ApleyAllison P Nahom Carfagno, MD   Patient does not have a PE on chest x-ray. She does have some borderline cardiomegaly. I will discharge the patient home to have her follow-up with her primary care physician for further evaluation of the shortness of breath. She was not hypoxic on ambulation and her vital signs are unremarkable.   Rebecka ApleyAllison P Katelinn Justice, MD 09/09/16 [name]

## 2016-09-09 NOTE — Discharge Instructions (Signed)
Please follow up with your primary care physician for further evaluation. Please return with any worsened symptoms °

## 2016-09-11 ENCOUNTER — Encounter: Payer: Self-pay | Admitting: Family Medicine

## 2016-09-11 ENCOUNTER — Ambulatory Visit (INDEPENDENT_AMBULATORY_CARE_PROVIDER_SITE_OTHER): Payer: Managed Care, Other (non HMO) | Admitting: Family Medicine

## 2016-09-11 DIAGNOSIS — R600 Localized edema: Secondary | ICD-10-CM

## 2016-09-11 DIAGNOSIS — I10 Essential (primary) hypertension: Secondary | ICD-10-CM

## 2016-09-11 DIAGNOSIS — R0609 Other forms of dyspnea: Secondary | ICD-10-CM

## 2016-09-11 MED ORDER — LOSARTAN POTASSIUM 100 MG PO TABS: 100.0000 mg | ORAL_TABLET | Freq: Every day | ORAL | 3 refills | Status: DC

## 2016-09-11 MED ORDER — FUROSEMIDE 20 MG PO TABS: 20.0000 mg | ORAL_TABLET | Freq: Every day | ORAL | 0 refills | Status: DC

## 2016-09-11 NOTE — Assessment & Plan Note (Addendum)
No edema noted today at all; advised compression stockings during the day only; consider decreasing CCB; for now, with strong fam hx of heart failure, will stop the hctz and start furosemide; if normal LVEF, then will put her back on the hctz and decrease the CCB; avoid salt

## 2016-09-11 NOTE — Patient Instructions (Addendum)
Stop losartan-hctz combo Start plain losartan Use plain Lasix (furosemide) daily for 3 days and then stop Eat a banana every day you take the Lasix I'll recommend compression stockings during the day; do not sleep in them We'll get an echocardiogram Return in 2 weeks Try to get less than 1500 mg of sodium a day (at least less than 2,000 mg a day)

## 2016-09-11 NOTE — Assessment & Plan Note (Signed)
Will stop the HCTZ and use furosemide; may consider decreasing the CCB, but no leg edema noted today; will see her back in 2 weeks for recheck BP

## 2016-09-11 NOTE — Assessment & Plan Note (Signed)
Check echo; avoid salt; already had chest CT; start low dose lasix for three days; return for f/u in 2 weeks

## 2016-09-11 NOTE — Progress Notes (Signed)
BP 126/72   Pulse 74   Temp 97.6 F (36.4 C) (Oral)   Resp 14   Wt 167 lb 5 oz (75.9 kg)   SpO2 97%   BMI 30.60 kg/m    Subjective:    Patient ID: Amanda Lyons, female    DOB: 11/01/1955, 61 y.o.   MRN: [id number]  HPI: Amanda Lyons is a 61 y.o. female  Chief Complaint  Patient presents with  . Hospitalization Follow-up    09/08/2016 shortness of breath; Swollen feet and ankle since 09/05/2016   Patient is here for ER follow-up Shortness of breath; swelling in the legs; feet get more swollen through the day; they swell up 3x their normal size She went to ER; positive D-dimer; chest CT done, enlarged heart Troponin I was normal; D-dimer over 650, BNP normal at 40 Normal CBC Normal creatinine and normal K+ Sleeping with HOB elevated; no PND; DOE from just car to building at work  LVEF on echo 01/10/2014 was 65% Father died from CHF; mother also had CHF; maternal GF died from a massive heart attack She has seen cardiologist a few years ago; had heart cath too a few years ago  Saw pulmonologist, Dr. Sung Amabile in April; no wheezing  Chest CT: IMPRESSION: 1. No evidence of pulmonary embolus. 2. Minimal bibasilar atelectasis noted.  Lungs otherwise clear. 3. Borderline cardiomegaly.   Electronically Signed   By: Roanna Raider M.D.   On: 09/08/2016 23:42 --------------------------------- CXR done too CLINICAL DATA:  Dyspnea for 1 week. Bilateral lower extremity edema began 2 days ago.  EXAM: CHEST  2 VIEW  COMPARISON:  07/10/2015  FINDINGS: Minimal curvilinear atelectasis in the left base. The lungs are otherwise clear. The pulmonary vasculature is normal. Hilar, mediastinal and cardiac contours are unremarkable and unchanged. There is no pleural effusion  IMPRESSION: No active cardiopulmonary disease.   Electronically Signed   By: Ellery Plunk M.D.   On: 09/08/2016 20:02  Depression screen [facility] 2/9 09/11/2016 05/22/2016 01/14/2016  Decreased  Interest 0 0 0  Down, Depressed, Hopeless 0 0 0  PHQ - 2 Score 0 0 0   Relevant past medical, surgical, family and social history reviewed Past Medical History:  Diagnosis Date  . Anxiety   . Asthma   . Chest pain 11/08/2013  . Diabetes mellitus without complication (HCC)   . Dyslipidemia 11/08/2013  . Dysrhythmia    fast  . GERD (gastroesophageal reflux disease)   . History of anxiety    had anxiety attacks  . History of kidney stones   . HNP (herniated nucleus pulposus), lumbar 05/02/2013  . Hypertension   . Trigeminal neuralgia    Past Surgical History:  Procedure Laterality Date  . ABDOMINAL HYSTERECTOMY    . BRAIN SURGERY     for trigeminal neuralgia  . CARDIAC CATHETERIZATION    . gamma knife  1999   for trigeminal neuralgia  . LEFT HEART CATHETERIZATION WITH CORONARY ANGIOGRAM N/A 10/11/2014   Procedure: LEFT HEART CATHETERIZATION WITH CORONARY ANGIOGRAM;  Surgeon: Lesleigh Noe, MD;  Location: [facility] CATH LAB;  Service: Cardiovascular;  Laterality: N/A;  . LUMBAR LAMINECTOMY/DECOMPRESSION MICRODISCECTOMY  04/19/2003   Right L5-S1 intralaminar laminotomy for excision of herniated disk with the operating microscope  . LUMBAR LAMINECTOMY/DECOMPRESSION MICRODISCECTOMY Right 05/02/2013   Procedure: Right Lumbar Five Sacral One Microdiskectomy;  Surgeon: Temple Pacini, MD;  Location: [facility] NEURO ORS;  Service: Neurosurgery;  Laterality: Right;  LUMBAR LAMINECTOMY/DECOMPRESSION MICRODISCECTOMY 1 LEVEL  .  SHOULDER ARTHROSCOPY WITH OPEN ROTATOR CUFF REPAIR Right 05/28/2016   Procedure: SHOULDER ARTHROSCOPY WITH OPEN ROTATOR CUFF REPAIR;  Surgeon: Deeann SaintHoward Miller, MD;  Location: [facility] ORS;  Service: Orthopedics;  Laterality: Right;   Family History  Problem Relation Age of Onset  . CVA Father   . Heart failure Father   . Heart disease Father   . Diabetes Father   . Hypertension Father   . Ovarian cancer Mother     early 3770's  . Heart failure Maternal Grandmother   . Heart attack  Maternal Grandfather   . Diabetes Sister   . Hypertension Sister    Social History  Substance Use Topics  . Smoking status: Never Smoker  . Smokeless tobacco: Never Used  . Alcohol use No   Interim medical history since last visit reviewed. Allergies and medications reviewed  Review of Systems Per HPI unless specifically indicated above     Objective:    BP 126/72   Pulse 74   Temp 97.6 F (36.4 C) (Oral)   Resp 14   Wt 167 lb 5 oz (75.9 kg)   SpO2 97%   BMI 30.60 kg/m   Wt Readings from Last 3 Encounters:  09/11/16 167 lb 5 oz (75.9 kg)  09/08/16 167 lb (75.8 kg)  05/28/16 165 lb (74.8 kg)    Physical Exam  Constitutional: She appears well-developed and well-nourished. No distress.  Eyes: EOM are normal. No scleral icterus.  Neck: No thyromegaly present.  Cardiovascular: Normal rate and regular rhythm.   Pulmonary/Chest: Effort normal and breath sounds normal. No respiratory distress. She has no wheezes. She has no rales.  Abdominal: She exhibits no distension.  Musculoskeletal: She exhibits no edema.  Skin: She is not diaphoretic. No pallor.  Psychiatric: She has a normal mood and affect. Her behavior is normal. Judgment and thought content normal. Her mood appears not anxious. She does not exhibit a depressed mood.   Diabetic Foot Form - Detailed   Diabetic Foot Exam - detailed Diabetic Foot exam was performed with the following findings:  Yes 09/11/2016  9:57 AM  Visual Foot Exam completed.:  Yes  Are the toenails ingrown?:  No Normal Range of Motion:  Yes Pulse Foot Exam completed.:  Yes  Right Dorsalis Pedis:  Present Left Dorsalis Pedis:  Present  Sensory Foot Exam Completed.:  Yes Semmes-Weinstein Monofilament Test R Site 1-Great Toe:  Pos L Site 1-Great Toe:  Pos  R Site 4:  Pos L Site 4:  Pos  R Site 5:  Pos L Site 5:  Pos         Results for orders placed or performed during the hospital encounter of 09/08/16  Basic metabolic panel  Result  Value Ref Range   Sodium 139 135 - 145 mmol/L   Potassium 4.1 3.5 - 5.1 mmol/L   Chloride 101 101 - 111 mmol/L   CO2 29 22 - 32 mmol/L   Glucose, Bld 123 (H) 65 - 99 mg/dL   BUN 20 6 - 20 mg/dL   Creatinine, Ser 2.130.66 0.44 - 1.00 mg/dL   Calcium 08.610.3 8.9 - 57.810.3 mg/dL   GFR calc non Af Amer >60 >60 mL/min   GFR calc Af Amer >60 >60 mL/min   Anion gap 9 5 - 15  CBC  Result Value Ref Range   WBC 9.4 3.6 - 11.0 K/uL   RBC 4.67 3.80 - 5.20 MIL/uL   Hemoglobin 13.6 12.0 - 16.0 g/dL   HCT 40.6  35.0 - 47.0 %   MCV 86.9 80.0 - 100.0 fL   MCH 29.2 26.0 - 34.0 pg   MCHC 33.6 32.0 - 36.0 g/dL   RDW 16.1 09.6 - 04.5 %   Platelets 361 150 - 440 K/uL  Troponin I  Result Value Ref Range   Troponin I <0.03 <0.03 ng/mL  Brain natriuretic peptide  Result Value Ref Range   B Natriuretic Peptide 40.0 0.0 - 100.0 pg/mL  Fibrin derivatives D-Dimer ([facility] only)  Result Value Ref Range   Fibrin derivatives D-dimer ([facility]) 666 (H) 0 - 499      Assessment & Plan:   Problem List Items Addressed This Visit      Cardiovascular and Mediastinum   Hypertension    Will stop the HCTZ and use furosemide; may consider decreasing the CCB, but no leg edema noted today; will see her back in 2 weeks for recheck BP      Relevant Medications   losartan (COZAAR) 100 MG tablet   furosemide (LASIX) 20 MG tablet     Other   Dyspnea on exertion    Check echo; avoid salt; already had chest CT; start low dose lasix for three days; return for f/u in 2 weeks      Relevant Orders   ECHOCARDIOGRAM COMPLETE   Bilateral leg edema    No edema noted today at all; advised compression stockings during the day only; consider decreasing CCB; for now, with strong fam hx of heart failure, will stop the hctz and start furosemide; if normal LVEF, then will put her back on the hctz and decrease the CCB; avoid salt          Follow up plan: No Follow-up on file.  An after-visit summary was printed and given to the patient  at check-out.  Please see the patient instructions which may contain other information and recommendations beyond what is mentioned above in the assessment and plan.  Meds ordered this encounter  Medications  . losartan (COZAAR) 100 MG tablet    Sig: Take 1 tablet (100 mg total) by mouth daily. This replaces the combo BP medicine    Dispense:  90 tablet    Refill:  3  . furosemide (LASIX) 20 MG tablet    Sig: Take 1 tablet (20 mg total) by mouth daily. For 3 days, then as directed    Dispense:  30 tablet    Refill:  0    Orders Placed This Encounter  Procedures  . ECHOCARDIOGRAM COMPLETE

## 2016-09-13 ENCOUNTER — Other Ambulatory Visit: Payer: Self-pay | Admitting: Family Medicine

## 2016-09-13 NOTE — Telephone Encounter (Signed)
Last sgpt and lipids reviewed; Rx approved 

## 2016-09-16 ENCOUNTER — Telehealth: Payer: Self-pay | Admitting: Family Medicine

## 2016-09-16 NOTE — Telephone Encounter (Signed)
Pt is checking status on referral for echocardiogram. States that after 130pm she will not be able to answer the phone. States that it is okay to leave detailed message if she does not answer [phone]

## 2016-09-16 NOTE — Telephone Encounter (Signed)
I don't know; I put the order in on 09/11/16; please forward to whomever in the office would be able to help her

## 2016-09-17 NOTE — Telephone Encounter (Signed)
Amanda Lyons has checked into this.

## 2016-09-18 ENCOUNTER — Encounter: Payer: Self-pay | Admitting: Family Medicine

## 2016-09-18 ENCOUNTER — Other Ambulatory Visit: Payer: Self-pay

## 2016-09-18 ENCOUNTER — Telehealth: Payer: Self-pay

## 2016-09-18 DIAGNOSIS — R0609 Other forms of dyspnea: Secondary | ICD-10-CM

## 2016-09-18 NOTE — Telephone Encounter (Signed)
I contacted this patient to inform her that she has been scheduled to have her ECHO on 09/23/16 @ 10am at the main hospital and that she is to arrive at 9:30am.   She had already informed me this morning that she will not be able to answer my call due to her being in therapy but she gave me permission to leave it on her voicemail.

## 2016-09-23 ENCOUNTER — Ambulatory Visit
Admission: RE | Admit: 2016-09-23 | Discharge: 2016-09-23 | Disposition: A | Discharge status: Home / Self Care | Payer: Managed Care, Other (non HMO) | Source: Ambulatory Visit | Attending: Family Medicine | Admitting: Family Medicine

## 2016-09-23 DIAGNOSIS — I10 Essential (primary) hypertension: Secondary | ICD-10-CM | POA: Insufficient documentation

## 2016-09-23 DIAGNOSIS — R0609 Other forms of dyspnea: Secondary | ICD-10-CM

## 2016-09-23 DIAGNOSIS — K219 Gastro-esophageal reflux disease without esophagitis: Secondary | ICD-10-CM | POA: Insufficient documentation

## 2016-09-23 DIAGNOSIS — E119 Type 2 diabetes mellitus without complications: Secondary | ICD-10-CM | POA: Diagnosis not present

## 2016-09-23 DIAGNOSIS — R079 Chest pain, unspecified: Secondary | ICD-10-CM | POA: Insufficient documentation

## 2016-09-23 DIAGNOSIS — F419 Anxiety disorder, unspecified: Secondary | ICD-10-CM | POA: Insufficient documentation

## 2016-09-23 DIAGNOSIS — J45909 Unspecified asthma, uncomplicated: Secondary | ICD-10-CM | POA: Diagnosis not present

## 2016-09-23 DIAGNOSIS — E785 Hyperlipidemia, unspecified: Secondary | ICD-10-CM | POA: Insufficient documentation

## 2016-09-23 NOTE — Progress Notes (Signed)
*  PRELIMINARY RESULTS* Echocardiogram 2D Echocardiogram has been performed.  Cristela BlueHege, Quana Chamberlain 09/23/2016, 10:07 AM

## 2016-09-25 ENCOUNTER — Encounter: Payer: Self-pay | Admitting: Family Medicine

## 2016-09-25 ENCOUNTER — Ambulatory Visit (INDEPENDENT_AMBULATORY_CARE_PROVIDER_SITE_OTHER): Payer: Managed Care, Other (non HMO) | Admitting: Family Medicine

## 2016-09-25 ENCOUNTER — Telehealth: Payer: Self-pay | Admitting: Cardiology

## 2016-09-25 VITALS — BP 124/60 | HR 76 | Temp 98.1°F | Wt 169.0 lb

## 2016-09-25 DIAGNOSIS — R079 Chest pain, unspecified: Secondary | ICD-10-CM | POA: Diagnosis not present

## 2016-09-25 DIAGNOSIS — R600 Localized edema: Secondary | ICD-10-CM

## 2016-09-25 DIAGNOSIS — I10 Essential (primary) hypertension: Secondary | ICD-10-CM

## 2016-09-25 MED ORDER — FUROSEMIDE 40 MG PO TABS: 40.0000 mg | ORAL_TABLET | Freq: Every day | ORAL | 0 refills | Status: DC

## 2016-09-25 NOTE — Patient Instructions (Addendum)
Stop the amlodipine Increase metoprolol succinate to 50 mg once a day Wear the compression stockings Please see the cardiologist for evaluation ASAP Check your blood pressure and call me tomorrow and Monday Check your heart rate, and call me with those as well; goal heart rate 55-70

## 2016-09-25 NOTE — Telephone Encounter (Signed)
Pt is calling to inform Dr Delton SeeNelson that she saw her PCP for complaints of LEE over the course of 3 weeks.   Pt states that her PCP discontinued her amlodipine and increased her Metoprolol and advised her to follow-up with Dr Delton SeeNelson to address her LEE.  Pt was also given compression stockings to wear daily.   PCP is in [facility] and Dr Delton SeeNelson can read her office note with the pt from today.   Pt is scheduled to see Dr Delton SeeNelson on 10/06/16 for this issue.   Pt states Dr Delton SeeNelson gave her lasix at one time and advised her to only take this x 3 days, for this same issue.   Pt wants Dr Delton SeeNelson to advise if she should take any additional meds for this issue now, or wait to make recommendations when she comes to see her on 10/06/16.   Informed the pt that I will route this message to Dr Delton SeeNelson to review and advise, and I will follow-up with the pt shortly thereafter.   Pt verbalized understanding and agrees with this plan.

## 2016-09-25 NOTE — Telephone Encounter (Signed)
Pt gave the ok earlier to leave a detailed message on her cell number, for she is at work:  Left a detailed message on the pts confirmed VM that Dr Delton SeeNelson agreed with her PCP, and she recommends that she start taking lasix 40 mg po daily until she see's her on 2/26.   Left a message on her cell that I will send this script into her local pharmacy in [place].  Left a message that Dr Delton SeeNelson wants her to come in for labs a few days prior to that visit, to check a cmet and bnp.  Advised the pt on her VM that she should call the office back tomorrow, and ask the operator to schedule her a lab appt prior too her 2/26 OV with Dr Delton SeeNelson. Informed the pt through her cell phone to tell the operator that the orders are in, and I advised her to make the lab appt.  Advised the pt to request to speak with a triage Nurse for further questions regarding this.

## 2016-09-25 NOTE — Telephone Encounter (Signed)
New message    Pt c/o swelling: STAT is pt has developed SOB within 24 hours  1. How long have you been experiencing swelling? About 3 weeks-it's getting worse.   2. Where is the swelling located? Legs and feet  3.  Are you currently taking a "fluid pill"? no  4.  Are you currently SOB? No-SOB when she walks or while at work  5.  Have you traveled recently? No  Pt states she went to primary dr today and was told she should follow up with Dr. Delton SeeNelson. Appt scheduled on 10/06/16 at 8:15am.

## 2016-09-25 NOTE — Telephone Encounter (Signed)
I agree with her PCP, she can also take lasix 40 mg po daily until she sees me on 10/06/2016. She should have CMP and BNP checked few days prior to the visit.

## 2016-09-25 NOTE — Progress Notes (Signed)
BP 124/60   Pulse 76   Temp 98.1 F (36.7 C) (Oral)   Wt 169 lb (76.7 kg)   SpO2 94%   BMI 30.91 kg/m    Subjective:    Patient ID: Amanda Lyons, female    DOB: 09-06-1955, 61 y.o.   MRN: [id number]  HPI: Amanda Lyons is a 61 y.o. female  Chief Complaint  Patient presents with  . Follow-up   Last Friday, she was having chest pain; "dull" with a few sharp ones; kept rubbing it; she kept trying to take deep breaths, then left off some; happened on Saturday and Sunday too, lasted an hour; Saturday all morning and evening, a few Sunday afternoon; just sitting in a recliner when it happened Did not go get checked out She sees Dr. Delton [place] She has not seen her in two years; her blood pressure shot up and ran all these tests and they did a heart cath; they did not find anything patient says She is not having any chest pain now Never had a blood clot; no known fam hx of blood clot She had a positive test and they did a chest CT a few weeks ago b/c of [facility] and they did CT scan then, no blood clot They did CXR, CT scan, EKGs, and all kind of blood work Echo was just done on 09/23/16, mild LVH, mild grade 1 diastolic dysfunction  Chest CT on 09/08/16: IMPRESSION: 1. No evidence of pulmonary embolus. 2. Minimal bibasilar atelectasis noted.  Lungs otherwise clear. 3. Borderline cardiomegaly.   Electronically Signed   By: Roanna RaiderJeffery  Chang M.D.   On: 09/08/2016 23:42  Reviewed Dr. Aris LotKatarina Nelson's note from 11/2004 Left cardiac cath: 10/11/2014 ANGIOGRAPHIC DATA: The left main coronary artery is normal. The left anterior descending artery stops short of the left ventricular apex. The vessel is normal. The left circumflex artery is normal. The right coronary artery is normal. LEFT VENTRICULOGRAM: Left ventricular angiogram was done in the 30 RAO projection and revealed normal cavity size and normal systolic function. EF 60% IMPRESSIONS: 1. Normal left  heart catheterization with normal coronary arteries. 2. Normal left ventricular systolic and diastolic function. EF 60%  [facility] 2[facility]9 10/01/2016 09/25/2016 09/11/2016 05/22/2016 01/14/2016  Decreased Interest 0 0 0 0 0  Down, Depressed, Hopeless 0 0 0 0 0  PHQ - 2 Score 0 0 0 0 0   Relevant past medical, surgical, family and social history reviewed Past Medical History:  Diagnosis Date  . Anxiety   . Asthma   . Chest pain 11/08/2013  . Diabetes mellitus without complication (HCC)   . Dyslipidemia 11/08/2013  . Dysrhythmia    fast  . GERD (gastroesophageal reflux disease)   . History of anxiety    had anxiety attacks  . History of kidney stones   . HNP (herniated nucleus pulposus), lumbar 05/02/2013  . Hypertension   . Trigeminal neuralgia    Past Surgical History:  Procedure Laterality Date  . ABDOMINAL HYSTERECTOMY    . BRAIN SURGERY     for trigeminal neuralgia  . CARDIAC CATHETERIZATION    . gamma knife  1999   for trigeminal neuralgia  . LEFT HEART CATHETERIZATION WITH CORONARY ANGIOGRAM N/A 10/11/2014   Procedure: LEFT HEART CATHETERIZATION WITH CORONARY ANGIOGRAM;  Surgeon: Lesleigh NoeHenry W Smith III, MD;  [facility];  Service: Cardiovascular;  Laterality: N/A;  . LUMBAR LAMINECTOMY/DECOMPRESSION MICRODISCECTOMY  04/19/2003   Right L5-S1 intralaminar laminotomy  for excision of herniated disk with the operating microscope  . LUMBAR LAMINECTOMY/DECOMPRESSION MICRODISCECTOMY Right 05/02/2013   Procedure: Right Lumbar Five Sacral One Microdiskectomy;  Surgeon: Temple Pacini, MD;  Location: [facility] NEUR[facility] ORS;  Service: Neurosurgery;  Laterality: Right;  LUMBAR LAMINECTOMY/DECOMPRESSION MICRODISCECTOMY 1 LEVEL  . SHOULDER ARTHROSCOPY WITH OPEN ROTATOR CUFF REPAIR Right 05/28/2016   Procedure: SHOULDER ARTHROSCOPY WITH OPEN ROTATOR CUFF REPAIR;  Surgeon: Deeann Saint, MD;  Location: [facility] ORS;  Service: Orthopedics;  Laterality: Right;   Family History  Problem Relation Age of  Onset  . CVA Father   . Heart failure Father   . Heart disease Father   . Diabetes Father   . Hypertension Father   . Ovarian cancer Mother     early 62's  . Cancer Mother   . Heart failure Mother   . Heart failure Maternal Grandmother   . Heart attack Maternal Grandfather   . Diabetes Sister   . Hypertension Sister   . Hypertension Sister    Social History  Substance Use Topics  . Smoking status: Never Smoker  . Smokeless tobacco: Never Used  . Alcohol use No   Interim medical history since last visit reviewed. Allergies and medications reviewed  Review of Systems Per HPI unless specifically indicated above     Objective:    BP 124/60   Pulse 76   Temp 98.1 F (36.7 C) (Oral)   Wt 169 lb (76.7 kg)   SpO2 94%   BMI 30.91 kg/m     Physical Exam  Constitutional: She appears well-developed and well-nourished. No distress.  Eyes: EOM are normal. No scleral icterus.  Neck: No JVD present. No thyromegaly present.  Cardiovascular: Normal rate and regular rhythm.   Pulmonary/Chest: Effort normal and breath sounds normal. No respiratory distress. She has no wheezes. She has no rales.  Musculoskeletal: She exhibits edema (trace nonpitting lower ext edema).  Skin: She is not diaphoretic. No pallor.  Psychiatric: She has a normal mood and affect. Her behavior is normal. Judgment and thought content normal. Her mood appears not anxious. She does not exhibit a depressed mood.      Assessment & Plan:   Problem List Items Addressed This Visit      Cardiovascular and Mediastinum   Hypertension    Controlled today, but will change up medicine and stop the CCB in case this is exacerbating her edema; monitor closely, contact me with readings, f/u next week      Relevant Medications   metoprolol succinate (TOPROL-XL) 25 MG 24 hr tablet    Other Visit Diagnoses    Leg edema    -  Primary   may be secondary to CCB; stop CCB, adjust meds, compression stockings; avoid excess  sodium, close f/u; seeing heart specialist, r/o CHF   Chest pain, unspecified type       reviewed cardiologist note, her symptoms; she will keep f/u; echo done, chest CT to r/o PE was just done; to ER if needed       Follow up plan: Return in about 1 week (around 10/02/2016) for recheck.  An after-visit summary was printed and given to the patient at check-out.  Please see the patient instructions which may contain other information and recommendations beyond what is mentioned above in the assessment and plan.  Meds ordered this encounter  Medications  . metoprolol succinate (TOPROL-XL) 25 MG 24 hr tablet    Sig: Take 2 tablets (50 mg total) by mouth daily.  Dispense:  90 tablet    Refill:  1    No orders of the defined types were placed in this encounter.

## 2016-09-26 ENCOUNTER — Telehealth: Payer: Self-pay | Admitting: Family Medicine

## 2016-09-26 NOTE — Telephone Encounter (Signed)
Pt calling to give readings BP: 143/71 Heart Rate: 79

## 2016-09-26 NOTE — Telephone Encounter (Signed)
Patient notified via detail voicemail.

## 2016-09-26 NOTE — Telephone Encounter (Signed)
Thank you :)

## 2016-09-26 NOTE — Telephone Encounter (Signed)
Thank you Have her continue the metoprolol Monitor BP and pulse daily over weekend and call on Monday or Tuesday Thank you

## 2016-09-30 NOTE — Telephone Encounter (Signed)
Pt scheduled her lab appt on 2/22, prior too seeing Dr Delton SeeNelson on 2/26 at 0815.

## 2016-10-01 ENCOUNTER — Encounter: Payer: Self-pay | Admitting: Family Medicine

## 2016-10-01 ENCOUNTER — Ambulatory Visit (INDEPENDENT_AMBULATORY_CARE_PROVIDER_SITE_OTHER): Payer: Managed Care, Other (non HMO) | Admitting: Family Medicine

## 2016-10-01 VITALS — BP 138/68 | HR 87 | Temp 98.0°F | Resp 16 | Wt 167.4 lb

## 2016-10-01 DIAGNOSIS — E538 Deficiency of other specified B group vitamins: Secondary | ICD-10-CM | POA: Diagnosis not present

## 2016-10-01 DIAGNOSIS — E119 Type 2 diabetes mellitus without complications: Secondary | ICD-10-CM

## 2016-10-01 DIAGNOSIS — I10 Essential (primary) hypertension: Secondary | ICD-10-CM | POA: Diagnosis not present

## 2016-10-01 DIAGNOSIS — R5383 Other fatigue: Secondary | ICD-10-CM

## 2016-10-01 NOTE — Progress Notes (Signed)
BP 138/68   Pulse 87   Temp 98 F (36.7 C) (Oral)   Resp 16   Wt 167 lb 6.4 oz (75.9 kg)   SpO2 98%   BMI 30.62 kg/m    Subjective:    Patient ID: Amanda Lyons, female    DOB: 08/09/1956, 61 y.o.   MRN: [id number]  HPI: Amanda Lyons is a 61 y.o. female  Chief Complaint  Patient presents with  . Follow-up    1 week   Patient has been checking her blood pressures at home, as well as her pulse 144/69 pulse 81 152/75 pulse 66 164/83 pulse 71 158/78 pulse 67  Feeling tired for awhile Her cardiologist increased lasix to 40 mg daily, with good UOP; increased it on Friday or Saturday No cramps in the calves; having some palpitations; also having SHOB  Patient's B12 last June was only 294 Her mother and father both had to have B12 shots Vitamin D was normal last June Not anemic Normal glucose No one has done a sleep study Snores some, draggy and tired in the morning She gets maybe 6 hours of sleep a night Had PT this morning for her right shoulder  [facility] 10/01/2016 09/25/2016 09/11/2016 05/22/2016 01/14/2016  Decreased Interest 0 0 0 0 0  Down, Depressed, Hopeless 0 0 0 0 0  PHQ - 2 Score 0 0 0 0 0   Relevant past medical, surgical, family and social history reviewed Past Medical History:  Diagnosis Date  . Anxiety   . Asthma   . Chest pain 11/08/2013  . Diabetes mellitus without complication (HCC)   . Dyslipidemia 11/08/2013  . Dysrhythmia    fast  . GERD (gastroesophageal reflux disease)   . History of anxiety    had anxiety attacks  . History of kidney stones   . HNP (herniated nucleus pulposus), lumbar 05/02/2013  . Hypertension   . Trigeminal neuralgia    Past Surgical History:  Procedure Laterality Date  . ABDOMINAL HYSTERECTOMY    . BRAIN SURGERY     for trigeminal neuralgia  . CARDIAC CATHETERIZATION    . gamma knife  1999   for trigeminal neuralgia  . LEFT HEART CATHETERIZATION WITH CORONARY ANGIOGRAM N/A 10/11/2014   Procedure:  LEFT HEART CATHETERIZATION WITH CORONARY ANGIOGRAM;  Surgeon: Lesleigh NoeHenry W Smith III, MD;  [facility];  Service: Cardiovascular;  Laterality: N/A;  . LUMBAR LAMINECTOMY/DECOMPRESSION MICRODISCECTOMY  04/19/2003   Right L5-S1 intralaminar laminotomy for excision of herniated disk with the operating microscope  . LUMBAR LAMINECTOMY/DECOMPRESSION MICRODISCECTOMY Right 05/02/2013   Procedure: Right Lumbar Five Sacral One Microdiskectomy;  Surgeon: Temple PaciniHenry A Pool, MD;  Location: [facility]UR[facility];  Service: Neurosurgery;  Laterality: Right;  LUMBAR LAMINECTOMY/DECOMPRESSION MICRODISCECTOMY 1 LEVEL  . SHOULDER ARTHROSCOPY WITH OPEN ROTATOR CUFF REPAIR Right 05/28/2016   Procedure: SHOULDER ARTHROSCOPY WITH OPEN ROTATOR CUFF REPAIR;  Surgeon: Deeann SaintHoward Miller, MD;  Location: [facility] ORS;  Service: Orthopedics;  Laterality: Right;   Family History  Problem Relation Age of Onset  . CVA Father   . Heart failure Father   . Heart disease Father   . Diabetes Father   . Hypertension Father   . Ovarian cancer Mother     early 870's  . Cancer Mother   . Heart failure Mother   . Heart failure Maternal Grandmother   . Heart attack Maternal Grandfather   . Diabetes Sister   . Hypertension Sister   . Hypertension Sister  Social History  Substance Use Topics  . Smoking status: Never Smoker  . Smokeless tobacco: Never Used  . Alcohol use No   Interim medical history since last visit reviewed. Allergies and medications reviewed  Review of Systems Per HPI unless specifically indicated above     Objective:    BP 138/68   Pulse 87   Temp 98 F (36.7 C) (Oral)   Resp 16   Wt 167 lb 6.4 oz (75.9 kg)   SpO2 98%   BMI 30.62 kg/m   Wt Readings from Last 3 Encounters:  10/06/16 168 lb (76.2 kg)  10/01/16 167 lb 6.4 oz (75.9 kg)  09/25/16 169 lb (76.7 kg)    Physical Exam  Constitutional: She appears well-developed and well-nourished. No distress.  Eyes: EOM are normal. No scleral icterus.  Neck: No  JVD present. No thyromegaly present.  Cardiovascular: Normal rate and regular rhythm.   Pulmonary/Chest: Effort normal and breath sounds normal. No respiratory distress. She has no wheezes. She has no rales.  Musculoskeletal: She exhibits edema (trace nonpitting lower ext edema).  Skin: She is not diaphoretic. No pallor.  Psychiatric: She has a normal mood and affect. Her mood appears not anxious. She does not exhibit a depressed mood.   Diabetic Foot Form - Detailed   Diabetic Foot Exam - detailed Diabetic Foot exam was performed with the following findings:  Yes 10/01/2016  6:21 AM  Visual Foot Exam completed.:  Yes  Are the toenails ingrown?:  No Normal Range of Motion:  Yes Pulse Foot Exam completed.:  Yes  Right Dorsalis Pedis:  Present Left Dorsalis Pedis:  Present  Sensory Foot Exam Completed.:  Yes Swelling:  No Semmes-Weinstein Monofilament Test R Site 1-Great Toe:  Pos L Site 1-Great Toe:  Pos  R Site 4:  Pos L Site 4:  Pos  R Site 5:  Pos L Site 5:  Pos           Assessment & Plan:   Problem List Items Addressed This Visit      Cardiovascular and Mediastinum   Hypertension    Try the DASH guidelines; continue beta-blocker and ARB and see cardiologist next week; will leave some room in case cardiologist wants to either increase beta-blocker or add another agent        Endocrine   Type 2 diabetes mellitus (HCC)    Check A1c; foot exam by MD; taught what to look for      Relevant Orders   Hemoglobin A1c   Lipid panel    Other Visit Diagnoses    Vitamin B 12 deficiency    -  Primary   Relevant Orders   Vitamin B12   Other fatigue       Relevant Orders   TSH       Follow up plan: No Follow-up on file.  An after-visit summary was printed and given to the patient at check-out.  Please see the patient instructions which may contain other information and recommendations beyond what is mentioned above in the assessment and plan.  No orders of the defined  types were placed in this encounter.   Orders Placed This Encounter  Procedures  . TSH  . Vitamin B12  . Hemoglobin A1c  . Lipid panel

## 2016-10-01 NOTE — Assessment & Plan Note (Signed)
Check A1c; foot exam by MD; taught what to look for

## 2016-10-01 NOTE — Assessment & Plan Note (Signed)
Try the DASH guidelines; continue beta-blocker and ARB and see cardiologist next week; will leave some room in case cardiologist wants to either increase beta-blocker or add another agent

## 2016-10-01 NOTE — Patient Instructions (Addendum)
Your goal blood pressure is less than 130 mmHg on top. Try to follow the DASH guidelines (DASH stands for Dietary Approaches to Stop Hypertension) Try to limit the sodium in your diet.  Ideally, consume less than 1.5 grams (less than 1,500mg ) per day. Do not add salt when cooking or at the table.  Check the sodium amount on labels when shopping, and choose items lower in sodium when given a choice. Avoid or limit foods that already contain a lot of sodium. Eat a diet rich in fruits and vegetables and whole grains. Please do have fasting labs and contact me if you don't hear about the results in the next week  DASH Eating Plan DASH stands for "Dietary Approaches to Stop Hypertension." The DASH eating plan is a healthy eating plan that has been shown to reduce high blood pressure (hypertension). Additional health benefits may include reducing the risk of type 2 diabetes mellitus, heart disease, and stroke. The DASH eating plan may also help with weight loss. What do I need to know about the DASH eating plan? For the DASH eating plan, you will follow these general guidelines:  Choose foods with less than 150 milligrams of sodium per serving (as listed on the food label).  Use salt-free seasonings or herbs instead of table salt or sea salt.  Check with your health care provider or pharmacist before using salt substitutes.  Eat lower-sodium products. These are often labeled as "low-sodium" or "no salt added."  Eat fresh foods. Avoid eating a lot of canned foods.  Eat more vegetables, fruits, and low-fat dairy products.  Choose whole grains. Look for the word "whole" as the first word in the ingredient list.  Choose fish and skinless chicken or [place] more often than red meat. Limit fish, poultry, and meat to 6 oz (170 g) each day.  Limit sweets, desserts, sugars, and sugary drinks.  Choose heart-healthy fats.  Eat more home-cooked food and less restaurant, buffet, and fast food.  Limit  fried foods.  Do not fry foods. Cook foods using methods such as baking, boiling, grilling, and broiling instead.  When eating at a restaurant, ask that your food be prepared with less salt, or no salt if possible. What foods can I eat? Seek help from a dietitian for individual calorie needs. Grains  Whole grain or whole wheat bread. Brown rice. Whole grain or whole wheat pasta. Quinoa, bulgur, and whole grain cereals. Low-sodium cereals. Corn or whole wheat flour tortillas. Whole grain cornbread. Whole grain crackers. Low-sodium crackers. Vegetables  Fresh or frozen vegetables (raw, steamed, roasted, or grilled). Low-sodium or reduced-sodium tomato and vegetable juices. Low-sodium or reduced-sodium tomato sauce and paste. Low-sodium or reduced-sodium canned vegetables. Fruits  All fresh, canned (in natural juice), or frozen fruits. Meat and Other Protein Products  Ground beef (85% or leaner), grass-fed beef, or beef trimmed of fat. Skinless chicken or [place]. Ground chicken or [place]. Pork trimmed of fat. All fish and seafood. Eggs. Dried beans, peas, or lentils. Unsalted nuts and seeds. Unsalted canned beans. Dairy  Low-fat dairy products, such as skim or 1% milk, 2% or reduced-fat cheeses, low-fat ricotta or cottage cheese, or plain low-fat yogurt. Low-sodium or reduced-sodium cheeses. Fats and Oils  Tub margarines without trans fats. Light or reduced-fat mayonnaise and salad dressings (reduced sodium). Avocado. Safflower, olive, or canola oils. Natural peanut or almond butter. Other  Unsalted popcorn and pretzels. The items listed above may not be a complete list of recommended foods or beverages. Contact  your dietitian for more options.  What foods are not recommended? Grains  White bread. White pasta. White rice. Refined cornbread. Bagels and croissants. Crackers that contain trans fat. Vegetables  Creamed or fried vegetables. Vegetables in a cheese sauce. Regular canned vegetables.  Regular canned tomato sauce and paste. Regular tomato and vegetable juices. Fruits  Canned fruit in light or heavy syrup. Fruit juice. Meat and Other Protein Products  Fatty cuts of meat. Ribs, chicken wings, bacon, sausage, bologna, salami, chitterlings, fatback, hot dogs, bratwurst, and packaged luncheon meats. Salted nuts and seeds. Canned beans with salt. Dairy  Whole or 2% milk, cream, half-and-half, and cream cheese. Whole-fat or sweetened yogurt. Full-fat cheeses or blue cheese. Nondairy creamers and whipped toppings. Processed cheese, cheese spreads, or cheese curds. Condiments  Onion and garlic salt, seasoned salt, table salt, and sea salt. Canned and packaged gravies. Worcestershire sauce. Tartar sauce. Barbecue sauce. Teriyaki sauce. Soy sauce, including reduced sodium. Steak sauce. Fish sauce. Oyster sauce. Cocktail sauce. Horseradish. Ketchup and mustard. Meat flavorings and tenderizers. Bouillon cubes. Hot sauce. Tabasco sauce. Marinades. Taco seasonings. Relishes. Fats and Oils  Butter, stick margarine, lard, shortening, ghee, and bacon fat. Coconut, palm kernel, or palm oils. Regular salad dressings. Other  Pickles and olives. Salted popcorn and pretzels. The items listed above may not be a complete list of foods and beverages to avoid. Contact your dietitian for more information.  Where can I find more information? National Heart, Lung, and Blood Institute: CablePromo.itwww.nhlbi.nih.gov/health/health-topics/topics/dash/ This information is not intended to replace advice given to you by your health care provider. Make sure you discuss any questions you have with your health care provider. Document Released: 07/17/2011 Document Revised: 01/03/2016 Document Reviewed: 06/01/2013 Elsevier Interactive Patient Education  2017 [employer]

## 2016-10-02 ENCOUNTER — Other Ambulatory Visit: Payer: Managed Care, Other (non HMO) | Admitting: *Deleted

## 2016-10-02 DIAGNOSIS — E119 Type 2 diabetes mellitus without complications: Secondary | ICD-10-CM

## 2016-10-02 DIAGNOSIS — E785 Hyperlipidemia, unspecified: Secondary | ICD-10-CM

## 2016-10-02 DIAGNOSIS — R600 Localized edema: Secondary | ICD-10-CM

## 2016-10-02 LAB — COMPREHENSIVE METABOLIC PANEL
ALT: 22 IU/L (ref 0–32)
AST: 15 IU/L (ref 0–40)
Albumin/Globulin Ratio: 2 (ref 1.2–2.2)
Albumin: 4.1 g/dL (ref 3.5–5.5)
Alkaline Phosphatase: 125 IU/L — ABNORMAL HIGH (ref 39–117)
BUN/Creatinine Ratio: 34 — ABNORMAL HIGH (ref 9–23)
BUN: 23 mg/dL (ref 6–24)
Bilirubin Total: 0.3 mg/dL (ref 0.0–1.2)
CO2: 22 mmol/L (ref 18–29)
Calcium: 9.9 mg/dL (ref 8.7–10.2)
Chloride: 102 mmol/L (ref 96–106)
Creatinine, Ser: 0.67 mg/dL (ref 0.57–1.00)
GFR calc Af Amer: 111 (ref >59)
GFR calc non Af Amer: 97 (ref >59)
Globulin, Total: 2.1 (ref 1.5–4.5)
Glucose: 154 mg/dL — ABNORMAL HIGH (ref 65–99)
Potassium: 3.9 mmol/L (ref 3.5–5.2)
Sodium: 139 mmol/L (ref 134–144)
Total Protein: 6.2 g/dL (ref 6.0–8.5)

## 2016-10-02 LAB — PRO B NATRIURETIC PEPTIDE: NT-Pro BNP: 81 pg/mL (ref 0–287)

## 2016-10-02 NOTE — Addendum Note (Signed)
Addended by: Tonita PhoenixBOWDEN, Jeanette Moffatt K on: 10/02/2016 10:17 AM   Modules accepted: Orders

## 2016-10-02 NOTE — Addendum Note (Signed)
Addended by: BOWDEN, ROBIN K on: 10/02/2016 10:17 AM   Modules accepted: Orders  

## 2016-10-02 NOTE — Addendum Note (Signed)
Addended by: Tonita PhoenixBOWDEN, ROBIN K on: 10/02/2016 10:12 AM   Modules accepted: Orders

## 2016-10-02 NOTE — Addendum Note (Signed)
Addended by: Tonita PhoenixBOWDEN, Boss Danielsen K on: 10/02/2016 09:33 AM   Modules accepted: Orders

## 2016-10-02 NOTE — Assessment & Plan Note (Signed)
Controlled today, but will change up medicine and stop the CCB in case this is exacerbating her edema; monitor closely, contact me with readings, f/u next week

## 2016-10-02 NOTE — Addendum Note (Signed)
Addended by: Bristol Osentoski K on: 10/02/2016 09:33 AM   Modules accepted: Orders  

## 2016-10-02 NOTE — Addendum Note (Signed)
Addended by: Tonita PhoenixBOWDEN, Hattie Aguinaldo K on: 10/02/2016 09:34 AM   Modules accepted: Orders

## 2016-10-02 NOTE — Addendum Note (Signed)
Addended by: Tonita PhoenixBOWDEN, ROBIN K on: 10/02/2016 09:31 AM   Modules accepted: Orders

## 2016-10-02 NOTE — Addendum Note (Signed)
Addended by: Tonita PhoenixBOWDEN, ROBIN K on: 10/02/2016 09:32 AM   Modules accepted: Orders

## 2016-10-03 ENCOUNTER — Telehealth: Payer: Self-pay | Admitting: Cardiology

## 2016-10-03 LAB — LIPID PANEL
CHOL/HDL RATIO: 3.1 (ref 0.0–4.4)
CHOLESTEROL TOTAL: 173 mg/dL (ref 100–199)
HDL: 55 mg/dL (ref >39)
LDL Calculated: 87 (ref 0–99)
TRIGLYCERIDES: 154 mg/dL — AB (ref 0–149)
VLDL Cholesterol Cal: 31 (ref 5–40)

## 2016-10-03 LAB — VITAMIN B12: Vitamin B-12: 1496 pg/mL — ABNORMAL HIGH (ref 232–1245)

## 2016-10-03 LAB — HEMOGLOBIN A1C
Est. average glucose Bld gHb Est-mCnc: 134
HEMOGLOBIN A1C: 6.3 % — AB (ref 4.8–5.6)

## 2016-10-03 LAB — TSH: TSH: 0.969 u[IU]/mL (ref 0.450–4.500)

## 2016-10-03 NOTE — Telephone Encounter (Signed)
Called patient.  Relayed Dr Omelia BlackwaterNeson's message: Normal proBNP meaning she has no CHF. Normal electrolytes, elevated Glu- she should have HbA1c checked. Elevated ALP - stable for the last year.   Patient expressed understanding.

## 2016-10-03 NOTE — Telephone Encounter (Signed)
Follow Up:   Returning Ivy's call from yesterday,concerning her lab results.

## 2016-10-06 ENCOUNTER — Encounter: Payer: Self-pay | Admitting: Cardiology

## 2016-10-06 ENCOUNTER — Encounter (INDEPENDENT_AMBULATORY_CARE_PROVIDER_SITE_OTHER): Payer: Self-pay

## 2016-10-06 ENCOUNTER — Ambulatory Visit (INDEPENDENT_AMBULATORY_CARE_PROVIDER_SITE_OTHER): Payer: Managed Care, Other (non HMO) | Admitting: Cardiology

## 2016-10-06 VITALS — BP 150/72 | HR 68 | Ht 62.0 in | Wt 168.0 lb

## 2016-10-06 DIAGNOSIS — I10 Essential (primary) hypertension: Secondary | ICD-10-CM

## 2016-10-06 DIAGNOSIS — R600 Localized edema: Secondary | ICD-10-CM

## 2016-10-06 DIAGNOSIS — R072 Precordial pain: Secondary | ICD-10-CM | POA: Diagnosis not present

## 2016-10-06 DIAGNOSIS — R002 Palpitations: Secondary | ICD-10-CM

## 2016-10-06 DIAGNOSIS — R079 Chest pain, unspecified: Secondary | ICD-10-CM | POA: Insufficient documentation

## 2016-10-06 DIAGNOSIS — R0602 Shortness of breath: Secondary | ICD-10-CM | POA: Diagnosis not present

## 2016-10-06 MED ORDER — COLCHICINE 0.6 MG PO TABS: 0.6000 mg | ORAL_TABLET | Freq: Two times a day (BID) | ORAL | 0 refills | Status: DC

## 2016-10-06 NOTE — Patient Instructions (Signed)
Medication Instructions:   START TAKING COLCHICINE 0.6 MG TWICE DAILY WITH FOOD FOR 2 MONTHS ONLY     Testing/Procedures:  Your physician has recommended that you have a pulmonary function test. Pulmonary Function Tests are a group of tests that measure how well air moves in and out of your lungs.   Your physician has requested that you have en exercise stress myoview. For further information please visit [email or web address]. Please follow instruction sheet, as given.      Follow-Up:  2 MONTHS WITH DR Delton SeeNELSON       If you need a refill on your cardiac medications before your next appointment, please call your pharmacy.

## 2016-10-06 NOTE — Progress Notes (Signed)
Patient ID: Amanda RocherRebecca B Lyons, female   DOB: [birth year], 61 y.o.   MRN: [id number]    [name] Date of Encounter: 10/06/2016  Primary Care Provider:  Baruch GoutyMelinda Lada, MD Primary Cardiologist:  Tobias AlexanderKatarina Delphia Kaylor  Problem List   Past Medical History:  Diagnosis Date  . Anxiety   . Asthma   . Chest pain 11/08/2013  . Diabetes mellitus without complication (HCC)   . Dyslipidemia 11/08/2013  . Dysrhythmia    fast  . GERD (gastroesophageal reflux disease)   . History of anxiety    had anxiety attacks  . History of kidney stones   . HNP (herniated nucleus pulposus), lumbar 05/02/2013  . Hypertension   . Trigeminal neuralgia    Past Surgical History:  Procedure Laterality Date  . ABDOMINAL HYSTERECTOMY    . BRAIN SURGERY     for trigeminal neuralgia  . CARDIAC CATHETERIZATION    . gamma knife  1999   for trigeminal neuralgia  . LEFT HEART CATHETERIZATION WITH CORONARY ANGIOGRAM N/A 10/11/2014   Procedure: LEFT HEART CATHETERIZATION WITH CORONARY ANGIOGRAM;  Surgeon: Lesleigh NoeHenry W Smith III, MD;  [facility];  Service: Cardiovascular;  Laterality: N/A;  . LUMBAR LAMINECTOMY/DECOMPRESSION MICRODISCECTOMY  04/19/2003   Right L5-S1 intralaminar laminotomy for excision of herniated disk with the operating microscope  . LUMBAR LAMINECTOMY/DECOMPRESSION MICRODISCECTOMY Right 05/02/2013   Procedure: Right Lumbar Five Sacral One Microdiskectomy;  Surgeon: Temple PaciniHenry A Pool, MD;  Location: [facility] NEURO ORS;  Service: Neurosurgery;  Laterality: Right;  LUMBAR LAMINECTOMY/DECOMPRESSION MICRODISCECTOMY 1 LEVEL  . SHOULDER ARTHROSCOPY WITH OPEN ROTATOR CUFF REPAIR Right 05/28/2016   Procedure: SHOULDER ARTHROSCOPY WITH OPEN ROTATOR CUFF REPAIR;  Surgeon: Deeann SaintHoward Miller, MD;  Location: [facility] ORS;  Service: Orthopedics;  Laterality: Right;   Allergies  Allergies  Allergen Reactions  . Prednisone Nausea And Vomiting  . Sulfa Antibiotics Nausea And Vomiting    HPI  Amanda Lyons is a 61 y.o.  female, hypertension, dyslipidemia, GERD, chronic back pain status post spine surgery, who has been noticing high blood pressures at home for the last few days, also is noticing some exertional shortness of breath for the last few days at work was today driving and noticed substernal chest heaviness along with some palpitations and shortness of breath, this lasted for about 2 hours, chest heaviness was nonradiating, aggravated by exertion relieved by rest, she called EMS who arrived and gave her nitroglycerin with chest pain relief, then came to the ER where she was noticed to have high blood pressure, unremarkable EKG, chest x-ray and blood work and I was called to admit the patient.  The patient had similar presentation approximately 10 years ago when she was diagnosed with hypertension and [facility]. Stress test was negative and her symptoms resolved after her blood pressure was controlled.  After EMS was called her blood pressure was 201/110 and she gets some relief with sublingual nitroglycerin that brought her blood pressure to 190.  Her stress echo at the hospital was negative with hypertensive PB response.  12/20/2013 - The patient is coming after 1 month, her chest pain and heaviness has resolved, but she still feels SOB on moderate exertion and feels like lacking energy on some days. She has started to walk but not on a regular basis yet.   10/09/2014 - the patient is coming after 8 months, she complains of progressively worsening exertional chest pain and , now with minimal exertion that prevents her to perform  activities of daily living. At the presentation last year her chest pain was believed to be secondary to hypertensive urgency however her blood pressure is now much better controlled and her symptoms are worsening. She has been compliant to her meds. She has also been experiencing significant palpitations, last Friday lasting 5-6 hours. She experiences these almost  every day.  11/15/2014 - 1 month follow up, she underwent cardiac cath that was normal. She developed groin pain and arterial US was negative was pseudoaneurysm or fistula, this has resolved, she feels better, improved DOE, no chest pain, she is very active at work - works as a Engineer, petroleum at [employer]. Palpitations improved with metoprolol, only 3 since starting, few seconds lasting.   10/06/2016 - 2 year follow up, she was doing well until 2 months ago when she developed chest pains at rest, also DOE, with minimal exertion. Chest pain dull, retrosternal, non-exertional. She also feels short palpitations. No syncope. She went to the ER, positive D -dimer --> negative CTA chest for pulmonary embolism, no pneumonia, just mild B/L atelectasis, no CHF> She also complains of new LE edema, started on Lasix, but unable to take all the time as she cant to the bathroom at work often. Works at [employer] on a second shift.  Home Medications  Prior to Admission medications   Medication Sig Start Date End Date Taking? Authorizing Provider  aspirin EC 81 MG EC tablet Take 1 tablet (81 mg total) by mouth daily. 11/09/13  Yes Leroy Sea, MD  benazepril-hydrochlorthiazide (LOTENSIN HCT) 20-12.5 MG per tablet Take 1 tablet by mouth daily.   Yes Historical Provider, MD  carvedilol (COREG) 6.25 MG tablet Take 1 tablet (6.25 mg total) by mouth 2 (two) times daily with a meal. 11/09/13  Yes Leroy Sea, MD  nitroGLYCERIN (NITROSTAT) 0.4 MG SL tablet Place 1 tablet (0.4 mg total) under the tongue every 5 (five) minutes as needed for chest pain (CP or SOB). 11/09/13  Yes Leroy Sea, MD  omeprazole (PRILOSEC) 20 MG capsule Take 20 mg by mouth daily.   Yes Historical Provider, MD  oxyCODONE-acetaminophen (PERCOCET/ROXICET) 5-325 MG per tablet Take 0.5 tablets by mouth daily as needed for moderate pain or severe pain (back pain from recent surgery).   Yes Historical Provider, MD  simvastatin (ZOCOR) 20 MG tablet Take 20 mg by  mouth at bedtime.   Yes Historical Provider, MD    Family History  Family History  Problem Relation Age of Onset  . CVA Father   . Heart failure Father   . Heart disease Father   . Diabetes Father   . Hypertension Father   . Ovarian cancer Mother     early 79's  . Cancer Mother   . Heart failure Mother   . Heart failure Maternal Grandmother   . Heart attack Maternal Grandfather   . Diabetes Sister   . Hypertension Sister   . Hypertension Sister     Social History  Social History   Social History  . Marital status: Married    Spouse name: N/A  . Number of children: N/A  . Years of education: N/A   Occupational History  . Not on file.   Social History Main Topics  . Smoking status: Never Smoker  . Smokeless tobacco: Never Used  . Alcohol use No  . Drug use: No  . Sexual activity: Not on file   Other Topics Concern  . Not on file   Social History Narrative  .  No narrative on file     Review of Systems, as per HPI, otherwise negative General:  No chills, fever, night sweats or weight changes.  Cardiovascular:  No chest pain, dyspnea on exertion, edema, orthopnea, palpitations, paroxysmal nocturnal dyspnea. Dermatological: No rash, lesions/masses Respiratory: No cough, dyspnea Urologic: No hematuria, dysuria Abdominal:   No nausea, vomiting, diarrhea, bright red blood per rectum, melena, or hematemesis Neurologic:  No visual changes, wkns, changes in mental status. All other systems reviewed and are otherwise negative except as noted above.  Physical Exam  Blood pressure (!) 150/72, pulse 68, height 5\' 2"  (1.575 m), weight 168 lb (76.2 kg).  General: Pleasant, NAD Psych: Normal affect. Neuro: Alert and oriented X 3. Moves all extremities spontaneously. HEENT: Normal  Neck: Supple without bruits or JVD. Lungs:  Resp regular and unlabored, CTA. Heart: RRR no s3, s4, or murmurs. Abdomen: Soft, non-tender, non-distended, BS + x 4.  Extremities: No  clubbing, cyanosis or edema. DP/PT/Radials 2+ and equal bilaterally.  Labs:  No results for input(s): CKTOTAL, CKMB, TROPONINI in the last 72 hours. Lab Results  Component Value Date   WBC 9.4 09/08/2016   HGB 13.6 09/08/2016   HCT 40.6 09/08/2016   MCV 86.9 09/08/2016   PLT 361 09/08/2016        Component Value Date/Time   NA 139 10/02/2016 0934   NA 141 08/23/2014 1444   K 3.9 10/02/2016 0934   K 3.4 (L) 08/23/2014 1444   CL 102 10/02/2016 0934   CL 107 08/23/2014 1444   CO2 22 10/02/2016 0934   CO2 28 08/23/2014 1444   GLUCOSE 154 (H) 10/02/2016 0934   GLUCOSE 123 (H) 09/08/2016 1841   GLUCOSE 160 (H) 08/23/2014 1444   BUN 23 10/02/2016 0934   BUN 17 08/23/2014 1444   CREATININE 0.67 10/02/2016 0934   CREATININE 0.75 08/23/2014 1444   CALCIUM 9.9 10/02/2016 0934   CALCIUM 9.0 08/23/2014 1444   PROT 6.2 10/02/2016 0934   ALBUMIN 4.1 10/02/2016 0934   AST 15 10/02/2016 0934   ALT 22 10/02/2016 0934   ALKPHOS 125 (H) 10/02/2016 0934   BILITOT 0.3 10/02/2016 0934   GFRNONAA 97 10/02/2016 0934   GFRNONAA >60 08/23/2014 1444   GFRNONAA >60 03/24/2013 0658   GFRAA 111 10/02/2016 0934   GFRAA >60 08/23/2014 1444   GFRAA >60 03/24/2013 0658   Lab Results  Component Value Date   CHOL 173 10/02/2016    Accessory Clinical Findings  Echocardiogram - stress 11/09/2013  - Stress ECG conclusions: There were no stress arrhythmias or conduction abnormalities. The stress ECG was negative for ischemia. - Staged echo: Normal echo stress Impressions: - Normal study after maximal exercise. Hypertensive stress Response.  TTE: 09/2016 - Left ventricle: The cavity size was at the upper limits of   normal. Wall thickness was increased in a pattern of mild LVH.   Systolic function was normal. The estimated ejection fraction was   in the range of 60% to 65%. Wall motion was normal; there were no   regional wall motion abnormalities. Doppler parameters are   consistent with  abnormal left ventricular relaxation (grade 1   diastolic dysfunction). - Right ventricle: The cavity size was normal. Systolic function   was normal.  ECG: NSR, anterior infarct, age undetermined, negative T waves in the anterolateral leads.   Left cardiac cath: 10/11/2014 ANGIOGRAPHIC DATA: The left main coronary artery is normal. The left anterior descending artery stops short of the left ventricular apex.  The vessel is normal. The left circumflex artery is normal. The right coronary artery is normal. LEFT VENTRICULOGRAM: Left ventricular angiogram was done in the 30 RAO projection and revealed normal cavity size and normal systolic function. EF 60% IMPRESSIONS: 1. Normal left heart catheterization with normal coronary arteries. 2. Normal left ventricular systolic and diastolic function. EF 60%    Assessment & Plan  61 year old female with prior medical history of hypertensive urgency, hyperlipidemia   1. Exertional chest pain and shortness of breath - normal cath in 2016, we will repeat stress test, start colchicine 0.6 mg po BID for possible acute pericarditis.  2. DOE, LE edema - BNP normal, TTE - LVEF 60-65%, impaired relaxation, normal RVEF< normal RVSP, normal albumin, no pulmonary embolism, we will order PFTs.  It is very unusual presentation, ? Vasculitis?  3. Palpitations - frequent PVCs - 450 in 24 hour Holter  4. Hypertension - Renal artery [place] was normal. Recheck normal.   5. Hyperlipidemia - followed by primary care physician currently on atorvastatin 10 mg daily.  Follow up in 2 months.  Tobias Alexander, MD, [facility] 10/06/2016, 8:24 AM

## 2016-10-08 ENCOUNTER — Telehealth (HOSPITAL_COMMUNITY): Payer: Self-pay | Admitting: *Deleted

## 2016-10-08 NOTE — Telephone Encounter (Signed)
Patient given detailed instructions per Myocardial Perfusion Study Information Sheet for the test on 10/10/16. Patient notified to arrive 15 minutes early and that it is imperative to arrive on time for appointment to keep from having the test rescheduled.  If you need to cancel or reschedule your appointment, please call the office within 24 hours of your appointment. Failure to do so may result in a cancellation of your appointment, and a $50 no show fee. Patient verbalized understanding. Amanda Lyons    

## 2016-10-09 ENCOUNTER — Telehealth: Payer: Self-pay

## 2016-10-09 NOTE — Telephone Encounter (Signed)
We received a letter from [facility] regarding patient medication atorvastatin and Colchicine. The letter is asking us to contact the patient to make sure she is not taking these medication together. If she is they can cause increase in statin levels, myopathy and rhabdomyolysis. Left a message asking the patient to give us a call back regarding the concern.

## 2016-10-10 ENCOUNTER — Ambulatory Visit (HOSPITAL_COMMUNITY): Payer: Managed Care, Other (non HMO) | Attending: Cardiology

## 2016-10-10 ENCOUNTER — Other Ambulatory Visit: Payer: Self-pay | Admitting: Family Medicine

## 2016-10-10 DIAGNOSIS — R072 Precordial pain: Secondary | ICD-10-CM | POA: Diagnosis not present

## 2016-10-10 DIAGNOSIS — I10 Essential (primary) hypertension: Secondary | ICD-10-CM | POA: Insufficient documentation

## 2016-10-10 DIAGNOSIS — R600 Localized edema: Secondary | ICD-10-CM | POA: Diagnosis not present

## 2016-10-10 DIAGNOSIS — R0602 Shortness of breath: Secondary | ICD-10-CM | POA: Insufficient documentation

## 2016-10-10 DIAGNOSIS — R0789 Other chest pain: Secondary | ICD-10-CM | POA: Diagnosis present

## 2016-10-10 LAB — MYOCARDIAL PERFUSION IMAGING
Estimated workload: 8.5 METS
Exercise duration (min): 7 min
Exercise duration (sec): 0 s
LV dias vol: 94 mL (ref 46–106)
LV sys vol: 31 mL
MPHR: 161 {beats}/min
Peak HR: 150 {beats}/min
Percent HR: 93 %
RATE: 0.41
Rest HR: 62 {beats}/min
SDS: 3
SRS: 2
SSS: 7
TID: 1.07

## 2016-10-10 MED ORDER — TECHNETIUM TC 99M TETROFOSMIN IV KIT
33.0000 | PACK | Freq: Once | INTRAVENOUS | Status: AC | PRN
  Administered 2016-10-10: 33 via INTRAVENOUS
  Filled 2016-10-10: qty 33

## 2016-10-10 MED ORDER — TECHNETIUM TC 99M TETROFOSMIN IV KIT
10.5000 | PACK | Freq: Once | INTRAVENOUS | Status: AC | PRN
  Administered 2016-10-10: 10.5 via INTRAVENOUS
  Filled 2016-10-10: qty 11

## 2016-10-10 NOTE — Telephone Encounter (Signed)
Dr. Delton SeeNelson already prescribing her furosemide; I denied the Rx for additional through my office

## 2016-10-13 ENCOUNTER — Telehealth: Payer: Self-pay | Admitting: *Deleted

## 2016-10-13 MED ORDER — AMLODIPINE BESYLATE 2.5 MG PO TABS: 2.5000 mg | ORAL_TABLET | Freq: Every day | ORAL | 3 refills | Status: DC

## 2016-10-13 NOTE — Telephone Encounter (Signed)
-----   Message from Lars MassonKatarina H Nelson, MD sent at 10/12/2016  1:12 PM EST ----- No ischemia on the stress test but hypertensive response to stress, I would add amlodipine 2.5 mg po daily to her regimen.

## 2016-10-13 NOTE — Telephone Encounter (Signed)
Notified the pt that per Dr Delton SeeNelson, her stress test showed no ischemia, but she did have a hypertensive response, and she recommends that we add amlodipine 2.5 mg po daily. Confirmed the pharmacy of choice with the pt.  Pt verbalized understanding and agrees with this plan.

## 2016-10-17 ENCOUNTER — Telehealth: Payer: Self-pay | Admitting: Family Medicine

## 2016-10-17 NOTE — Telephone Encounter (Signed)
Pt mention to me that she is completely out of  Metoprolol. Looked over her med list and saw that she should have at least have 34 tablets left and a refill. Her pharmacy states is to early because she should have some left. I agree ans tried to figure out how she was taking them. Verified she is taking the medication as instructed. Called the pharmacy and spoke with tyler and she stated her insurance will cover it on 10/22/2016. If she need  medication until that time she can get 8 tablets for 11.98. Left pt message and informed her she can call her pharmacy she she choose to go that route.

## 2016-10-17 NOTE — Telephone Encounter (Signed)
States that on last visit Dr Sherie DonLada upped pt Metroprolol 25mg  to 50mg . However she is now out and the new script was not sent to the pharmacy. Please [facility] [phone]

## 2016-11-10 ENCOUNTER — Ambulatory Visit (INDEPENDENT_AMBULATORY_CARE_PROVIDER_SITE_OTHER): Payer: Managed Care, Other (non HMO) | Admitting: Internal Medicine

## 2016-11-10 DIAGNOSIS — R600 Localized edema: Secondary | ICD-10-CM

## 2016-11-10 DIAGNOSIS — R0602 Shortness of breath: Secondary | ICD-10-CM

## 2016-11-10 DIAGNOSIS — R072 Precordial pain: Secondary | ICD-10-CM

## 2016-11-10 LAB — PULMONARY FUNCTION TEST
DL/VA % pred: 136 %
DL/VA: 6.18 ml/min/mmHg/L
DLCO cor % pred: 115 %
DLCO cor: 24.98 ml/min/mmHg
DLCO unc % pred: 117 %
DLCO unc: 25.36 ml/min/mmHg
FEF 25-75 Post: 3.4 L/sec
FEF 25-75 Pre: 2.82 L/sec
FEF2575-%Change-Post: 20 %
FEF2575-%Pred-Post: 149 %
FEF2575-%Pred-Pre: 124 %
FEV1-%Change-Post: 7 %
FEV1-%Pred-Post: 99 %
FEV1-%Pred-Pre: 92 %
FEV1-Post: 2.39 L
FEV1-Pre: 2.21 L
FEV1FVC-%Change-Post: 2 %
FEV1FVC-%Pred-Pre: 107 %
FEV6-%Change-Post: 4 %
FEV6-%Pred-Post: 90 %
FEV6-%Pred-Pre: 86 %
FEV6-Post: 2.68 L
FEV6-Pre: 2.57 L
FEV6FVC-%Change-Post: 0 %
FEV6FVC-%Pred-Post: 104 %
FEV6FVC-%Pred-Pre: 103 %
FVC-%Change-Post: 4 %
FVC-%Pred-Post: 89 %
FVC-%Pred-Pre: 85 %
FVC-Post: 2.78 L
FVC-Pre: 2.65 L
Post FEV1/FVC ratio: 86 %
Post FEV6/FVC ratio: 100 %
Pre FEV1/FVC ratio: 84 %
Pre FEV6/FVC Ratio: 99 %
RV % pred: 80 %
RV: 1.5 L
TLC % pred: 79 %
TLC: 3.79 L

## 2016-11-10 NOTE — Progress Notes (Signed)
PFT done today. 

## 2016-11-18 ENCOUNTER — Ambulatory Visit (INDEPENDENT_AMBULATORY_CARE_PROVIDER_SITE_OTHER): Payer: Managed Care, Other (non HMO) | Admitting: Pulmonary Disease

## 2016-11-18 ENCOUNTER — Encounter: Payer: Self-pay | Admitting: Pulmonary Disease

## 2016-11-18 VITALS — BP 158/90 | HR 73 | Ht 62.0 in | Wt 166.0 lb

## 2016-11-18 DIAGNOSIS — R0609 Other forms of dyspnea: Secondary | ICD-10-CM

## 2016-11-18 DIAGNOSIS — J4530 Mild persistent asthma, uncomplicated: Secondary | ICD-10-CM

## 2016-11-18 DIAGNOSIS — R5381 Other malaise: Secondary | ICD-10-CM

## 2016-11-18 MED ORDER — ALBUTEROL SULFATE HFA 108 (90 BASE) MCG/ACT IN AERS: 2.0000 | INHALATION_SPRAY | Freq: Four times a day (QID) | RESPIRATORY_TRACT | 11 refills | Status: DC | PRN

## 2016-11-18 MED ORDER — FLUTICASONE FUROATE-VILANTEROL 100-25 MCG/INH IN AEPB: 1.0000 | INHALATION_SPRAY | Freq: Every day | RESPIRATORY_TRACT | 3 refills | Status: DC

## 2016-11-18 NOTE — Progress Notes (Signed)
PULMONARY OFFICE F/U NOTE  Requesting MD/Service: Baruch Gouty Date of initial consultation: 07/13/15 Reason for consultation: severe intractable cough  Initial HPI 07/14/15:  66 F in her USOH until approx 5 weeks ago when she developed symptoms of a URI and was diagnosed with sinusitis, otitis and bronchitis. She was treated with amoxicillin and a cough suppressant with minimal benefit. She has been plagued with intractable vigorous rattling cough since that time that has also failed to respond to Tessalon. She underwent a CXR which was interpreted as abnormal and a follow up CT chest was performed. The results are discussed below. She is now referred for this cough. She denies fever, chills, sweats, pleuritic or anginal CP, hemoptysis, LE edema and calf tenderness. OF note, she was on an ACEI.  Initial impression: 1) Severe cough, 2) Acute bronchitis vs bronchiolitis, 3) Chronic ACEI therapy. PLAN: Levaquin, prednisone, Tussionex, Cont tessalon perles, Stop benazapril - HCTZ (losentin), Hyzaar daily in place of Losentin  ROV 07/19/15: Minimal to no improvement. Severe rhinitis noted on exam. Nasal steroid initiated. Recommended over the counter cold and sinus formula. To complete levofloxacin and prednisone as previously prescribed. To cont PPI ROV 07/27/15: Minimal improvement. Still coughing vigorously in exam room. Tussionex refilled. Trial of Breo inhaler (sample provided and Rx sent) ROV 08/27/15: Breo helped approx 80% improvement but she couldn't afford co-payment and stopped taking after sample was completed. Also new complaint of occasional dyspnea. Cough and dyspnea worse with exposure to cold air PFTs 09/30/25: poor quality test due to difficulty performing spirometry maneuver. Suggestive of obstruction with significant improvement after bronchodilator challenge ROV 10/03/15: increased DOE. Cough nearly resolved. Treated as asthma exacerbation with prednisone course, continue Breo. PRN  albuterol ordered with instructions on its use ROV 11/13/15: much improved. Cough fully resolved. No new complaints. Continue Breo and PRN albuterol. F/U in 3-4 months ROV 11/18/16: in interim, ED visit 09/08/16 with dyspnea. CXR 09/08/16: NACPD  CTA chest 09/08/16: no PE, normal. Referred to cardiology. Full cardiac workup revealed no evidence of ischemia and normal echocardiogram. PFTs were ordered by Cardiology and referred back to Pulmonary to discuss results. PFTs 11/10/16: normal spirometry, low normal to minimally reduced TLC. DLCO normal.   SUBJ: Since last visit, ED visit 09/08/16 with dyspnea. CXR 09/08/16: NACPD  CTA chest 09/08/16: no PE, normal. Referred to cardiology. Full cardiac workup revealed no evidence of ischemia and normal echocardiogram. PFTs were ordered by Cardiology and referred back to Pulmonary to discuss results. PFTs 11/10/16: normal spirometry, low normal to minimally reduced TLC. DLCO normal. Continues to have mild DOE with some variability. Denies CP, fever, purulent sputum, hemoptysis, LE edema and calf tenderness. Not using Breo or albuterol at all.  Vitals:   11/18/16 1203 11/18/16 1204  BP:  (!) 158/90  Pulse:  73  SpO2:  99%  Weight: 75.3 kg (166 lb)   Height:  (1.575 m)     EXAM:  Gen: NAD HEENT: WNL  Neck: NO LAN, no JVD noted Lungs: full BS, no wheezes Cardiovascular: Reg, no M noted Abdomen: Soft, NT +BS Ext: no C/C/E   DATA:  BMP Latest Ref Rng & Units 10/02/2016 09/08/2016 05/22/2016  Glucose 65 - 99 mg/dL 161(W) 960(A) 540(J)  BUN 6 - 24 mg/dL 23 20 81(X)  Creatinine 0.57 - 1.00 mg/dL 9.14 7.82 9.56  BUN/Creat Ratio 9 - 23 34(H) - -  Sodium 134 - 144 mmol/L 139 139 139  Potassium 3.5 - 5.2 mmol/L 3.9 4.1 3.5  Chloride  96 - 106 mmol/L 102 101 104  CO2 18 - 29 mmol/L Calcium 8.7 - 10.2 mg/dL 9.9 16.1 9.8   CBC Latest Ref Rng & Units 09/08/2016 01/15/2016 10/09/2014  WBC 3.6 - 11.0 K/uL 9.4 7.0 8.0  Hemoglobin 12.0 - 16.0 g/dL  09.6 - 04.5  Hematocrit 35.0 - 47.0 % 40.6 40.7 40.0  Platelets 150 - 440 K/uL 361 333 352.0   Xrays and PFTs as above  IMPRESSION:   Exertional dyspnea without clear explanation. Previously thought to have mild persistent asthma. Recent PFTs fail to demonstrate obstruction. Suspect component of physical deconditioning since symptoms developed after shoulder surgery in Oct 2017   PLAN:  Resume Breo as previously ordered Resume PRN albuterol inhaler - 2 puffs as needed for shortness of breath  Advised increasing activity level - walking briskly 20-30 mins at least 5 days awake ROV 3-4 months  Billy Fischer, MD PCCM service Mobile [phone] Pager [phone]

## 2016-11-18 NOTE — Patient Instructions (Signed)
Resume Breo inhaler-1 inhalation daily area difference mouth after use Albuterol inhaler 1-2 sprays every 4 hours as needed I want to to work on reconditioning. Walk 20-30 minutes at least 5 days a week at a brisk pace Follow-up 3-4 months

## 2016-11-21 ENCOUNTER — Encounter: Payer: Self-pay | Admitting: Cardiology

## 2016-11-21 ENCOUNTER — Telehealth: Payer: Self-pay | Admitting: Pulmonary Disease

## 2016-11-21 NOTE — Telephone Encounter (Signed)
Called and spoke with Richard at the pharmacy and he stated that they have the pt listed with allergies to cortical steroids and this was an interaction with the breo.  DS please advise if you would like to change this medication.  Thanks  Allergies  Allergen Reactions  . Prednisone Nausea And Vomiting  . Sulfa Antibiotics Nausea And Vomiting

## 2016-11-21 NOTE — Telephone Encounter (Signed)
Pharmacy calling stating pt is allergic cortical steroids, this is interacting with her brio  Please advise     Reference number [id number]

## 2016-12-09 DIAGNOSIS — I209 Angina pectoris, unspecified: Secondary | ICD-10-CM

## 2016-12-09 HISTORY — DX: Angina pectoris, unspecified: I20.9

## 2016-12-10 ENCOUNTER — Other Ambulatory Visit: Payer: Self-pay | Admitting: Pulmonary Disease

## 2016-12-10 MED ORDER — ALBUTEROL SULFATE HFA 108 (90 BASE) MCG/ACT IN AERS: 2.0000 | INHALATION_SPRAY | Freq: Four times a day (QID) | RESPIRATORY_TRACT | 11 refills | Status: DC | PRN

## 2016-12-11 ENCOUNTER — Ambulatory Visit: Payer: Managed Care, Other (non HMO) | Admitting: Cardiology

## 2016-12-16 ENCOUNTER — Telehealth: Payer: Self-pay | Admitting: Pulmonary Disease

## 2016-12-16 NOTE — Telephone Encounter (Signed)
Pt calling stating any refills from our office [facility] [address]   Please no longer use the mail order

## 2016-12-16 NOTE — Telephone Encounter (Signed)
LMOM for pt to call back to see if she needs any refills or if she is just calling to update pharmacy.

## 2016-12-17 NOTE — Telephone Encounter (Signed)
LMOM for pt to call back if she needs any refills at this time.

## 2016-12-18 MED ORDER — ALBUTEROL SULFATE HFA 108 (90 BASE) MCG/ACT IN AERS: 2.0000 | INHALATION_SPRAY | Freq: Four times a day (QID) | RESPIRATORY_TRACT | 11 refills | Status: DC | PRN

## 2016-12-18 NOTE — Telephone Encounter (Signed)
Original rx for Albuterol was submitted to patient mail order pharmacy. After telephone call from patient refill was [facility].

## 2016-12-20 ENCOUNTER — Other Ambulatory Visit: Payer: Self-pay | Admitting: Family Medicine

## 2016-12-20 NOTE — Telephone Encounter (Signed)
Reviewed Feb Cr and A1c; Rx approved

## 2017-01-19 ENCOUNTER — Other Ambulatory Visit: Payer: Self-pay | Admitting: Family Medicine

## 2017-01-19 MED ORDER — METOPROLOL SUCCINATE ER 50 MG PO TB24: 50.0000 mg | ORAL_TABLET | Freq: Every day | ORAL | 1 refills | Status: DC

## 2017-01-19 NOTE — Telephone Encounter (Signed)
rx written

## 2017-02-05 ENCOUNTER — Other Ambulatory Visit: Payer: Self-pay | Admitting: Family Medicine

## 2017-02-09 ENCOUNTER — Other Ambulatory Visit: Payer: Self-pay | Admitting: Family Medicine

## 2017-02-09 NOTE — Telephone Encounter (Signed)
6mos of refills provided on 01/19/2017. Too soon to refill

## 2017-02-14 ENCOUNTER — Other Ambulatory Visit: Payer: Self-pay | Admitting: Family Medicine

## 2017-02-26 ENCOUNTER — Telehealth: Payer: Self-pay | Admitting: Cardiology

## 2017-02-26 ENCOUNTER — Telehealth: Payer: Self-pay

## 2017-02-26 ENCOUNTER — Other Ambulatory Visit: Payer: Self-pay

## 2017-02-26 ENCOUNTER — Other Ambulatory Visit: Payer: Self-pay | Admitting: Family Medicine

## 2017-02-26 MED ORDER — AMLODIPINE BESYLATE 5 MG PO TABS: 5.0000 mg | ORAL_TABLET | Freq: Every day | ORAL | 1 refills | Status: DC

## 2017-02-26 NOTE — Telephone Encounter (Signed)
Patient called states her blood pressure has been elevated top number only running in the 160's-170's range.  She also states not now but last week was having some chest pain/pressure.  I told patient if ever having chest pain or pressure to definitely have that checked at either at the ER or urgent care.  I verified patients meds and asked if taking as directed, I also noticed that one of the bp meds was prescribed by different doctor and asked if she saw a cardiologist she stated yes.  I told her that Dr.Lada would want her to call and notify them of high elevation and see if they would like to see her or adjust medications.

## 2017-02-26 NOTE — Telephone Encounter (Signed)
New message  Pt c/o BP issue: STAT if pt c/o blurred vision, one-sided weakness or slurred speech  1. What are your last 5 BP readings? Per pt ranging from 160-170/80   2. Are you having any other symptoms (ex. Dizziness, headache, blurred vision, passed out)? Pressure on chest   3. What is your BP issue? Pt would like to speak with RN to see if her bp is normal. Pt wants to speak with RN before scheduling appt. Please call back to discuss

## 2017-02-26 NOTE — Addendum Note (Signed)
Addended by: Loa SocksMARTIN, IVY M on: 02/26/2017 12:10 PM   Modules accepted: Orders

## 2017-02-26 NOTE — Telephone Encounter (Signed)
Spoke with pt and she states since last Thursday she has been having chest pressure intermittently throughout the day.  States it improves when she lays down but once she gets back up, it worsens.  She started checking her BP and it is consistently 160-170/80.  Denies CP, SOB, HA or visual disturbances.  States she occasionally feels "swimmy headed".  Currently pt is just having the chest pressure, no other sx.  Currently taking Amlodipine 2.5mg  and Losartan 100mg  QD.  Hasn't had Metoprolol Succinate in 2-3 weeks and says this is because prescribing office wouldn't fill the medication.    Spoke with Dr. Delton SeeNelson and she said to have pt increase her Amlodipine to 5mg  QD.    Spoke with pt and made her aware of recommendations per Dr. Delton SeeNelson.  Spoke with pt about monitoring BP and when appropriate to go to ER or call back.  Pt verbalized understanding and was appreciative for call.

## 2017-02-26 NOTE — Telephone Encounter (Signed)
Patient called back states spoke with cardiology, they went over meds again even though I asked her if she was taking both blood pressure meds, she found out by checking bottles that in fact she had only been taking the amlodipine.  So they said that is probably the problem she has not been on since march or April.  I told her it was just prescribed and sent to [facility], she states that she does not use this pharmacy anymore.  I have updated pharmacy, will need sent to correct pharmacy

## 2017-02-27 MED ORDER — METOPROLOL SUCCINATE ER 50 MG PO TB24: 50.0000 mg | ORAL_TABLET | Freq: Every day | ORAL | 0 refills | Status: DC

## 2017-02-27 NOTE — Telephone Encounter (Signed)
The last note I received requested 50 mg Toprol; this request is for 25 mg Toprol; please clarify which she is supposed to be taking; if she's supposed to be on 25 mg, please cancel the Rx for 50 mg and send this back to me

## 2017-02-27 NOTE — Telephone Encounter (Signed)
New Rx sent.

## 2017-03-02 ENCOUNTER — Telehealth: Payer: Self-pay | Admitting: Family Medicine

## 2017-03-02 NOTE — Telephone Encounter (Signed)
Pt is calling back stating she is on 25 mg of the Metoprolol.

## 2017-03-02 NOTE — Telephone Encounter (Signed)
Ok it has been updated and changed in chart

## 2017-03-02 NOTE — Telephone Encounter (Signed)
Updated in chart

## 2017-03-26 ENCOUNTER — Other Ambulatory Visit: Payer: Self-pay

## 2017-03-26 MED ORDER — ATORVASTATIN CALCIUM 10 MG PO TABS: 10.0000 mg | ORAL_TABLET | Freq: Every day | ORAL | 1 refills | Status: DC

## 2017-03-26 NOTE — Telephone Encounter (Signed)
Patient due for 6 month f/u for her diabetes, cholesterol, etc. I'll approve refill Ask her to come fasting and we look forward to seeing her soon

## 2017-03-26 NOTE — Telephone Encounter (Signed)
LMOM to inform pt to call the office to schedule an appt °

## 2017-05-28 ENCOUNTER — Other Ambulatory Visit: Payer: Self-pay | Admitting: Family Medicine

## 2017-06-04 ENCOUNTER — Ambulatory Visit (INDEPENDENT_AMBULATORY_CARE_PROVIDER_SITE_OTHER): Payer: 59 | Admitting: Family Medicine

## 2017-06-04 ENCOUNTER — Encounter: Payer: Self-pay | Admitting: Family Medicine

## 2017-06-04 VITALS — BP 140/78 | HR 67 | Temp 97.5°F | Resp 14 | Wt 171.6 lb

## 2017-06-04 DIAGNOSIS — I10 Essential (primary) hypertension: Secondary | ICD-10-CM

## 2017-06-04 DIAGNOSIS — J0100 Acute maxillary sinusitis, unspecified: Secondary | ICD-10-CM

## 2017-06-04 DIAGNOSIS — K219 Gastro-esophageal reflux disease without esophagitis: Secondary | ICD-10-CM | POA: Diagnosis not present

## 2017-06-04 DIAGNOSIS — J219 Acute bronchiolitis, unspecified: Secondary | ICD-10-CM

## 2017-06-04 MED ORDER — AMLODIPINE BESYLATE 5 MG PO TABS: 7.5000 mg | ORAL_TABLET | Freq: Every day | ORAL | 1 refills | Status: DC

## 2017-06-04 MED ORDER — AMOXICILLIN-POT CLAVULANATE 875-125 MG PO TABS: 1.0000 | ORAL_TABLET | Freq: Two times a day (BID) | ORAL | 0 refills | Status: AC

## 2017-06-04 NOTE — Assessment & Plan Note (Addendum)
Dash guidelines, avoid decongestants, NSAIDs; increase CCB; patient to monitor and call if not less than 130

## 2017-06-04 NOTE — Assessment & Plan Note (Signed)
Managed by pulmonologist; f/u with him if worsening or limiting activities

## 2017-06-04 NOTE — Patient Instructions (Addendum)
If you need something for aches or pains, try to use Tylenol (acetaminophen) instead of non-steroidals (which include Aleve, ibuprofen, Advil, Motrin, and naproxen); non-steroidals can cause long-term kidney damage Start the antibiotics Please do eat yogurt daily or take a probiotic daily for the next month or two We want to replace the healthy germs in the gut If you notice foul, watery diarrhea in the next two months, schedule an appointment RIGHT AWAY Check the Prilosec dose and call us with updated dose Caution: prolonged use of proton pump inhibitors like omeprazole (Prilosec), pantoprazole (Protonix), esomeprazole (Nexium), and others like Dexilant and Aciphex may increase your risk of pneumonia, Clostridium difficile colitis, osteoporosis, anemia and other health complications Try to limit or avoid triggers like coffee, caffeinated beverages, onions, chocolate, spicy foods, peppermint, acid foods like pizza, spaghetti sauce, and orange juice Lose weight if you are overweight or obese Try elevating the head of your bed by placing a small wedge between your mattress and box springs to keep acid in the stomach at night instead of coming up into your esophagus Be aware that meloxicam can raise your blood pressure Try to use PLAIN allergy medicine without the decongestant Avoid: phenylephrine, phenylpropanolamine, and pseudoephredine Increase your amlodipine to 7.5 mg daily Check your blood pressure and let me know if not under 130 on top

## 2017-06-04 NOTE — Progress Notes (Signed)
BP 140/78   Pulse 67   Temp (!) 97.5 F (36.4 C) (Oral)   Resp 14   Wt 171 lb 9.6 oz (77.8 kg)   SpO2 98%   BMI 31.39 kg/m    Subjective:    Patient ID: Amanda Lyons, female    DOB: [birth year], 61 y.o.   MRN: [id number]  HPI: Amanda Lyons is a 61 y.o. female  Chief Complaint  Patient presents with  . Medication Refill  . Dizziness   HPI Patient felt dizzy last week; she checked her blood pressure and it was high, 168/76 or 78 Just standing there cutting up salad, nothing stressful at all I asked about salt in the previous few meals; fish sandwich 500+ mg of sodium No decongestants, then says she took 500 mg tylenol; no cold and sinus medicine Feels a little off; trouble focusing; nothing spinning; just a little off Come and go; lasts about five minutes; no other symptoms when those happens Last note in the chart was to start back on 25 mg of metoprolol back in July Still gets [facility] when walking at times; sees pulmonologist, Dr. Bard Herbert; that's been going on for two years; bronchiolitis; even walking at a fast pace and she can really feel it; not using Breo regularly, though Thinks she has been fighting a cold or something the last few days Both knees are hurting so bad, she says getting up on the table meds reviewed; not taking colchicine She gets prilosec OTC from the store  Depression screen [facility] 2/9 06/04/2017 10/01/2016 09/25/2016 09/11/2016 05/22/2016  Decreased Interest 0 0 0 0 0  Down, Depressed, Hopeless 0 0 0 0 0  PHQ - 2 Score 0 0 0 0 0    Relevant past medical, surgical, family and social history reviewed Past Medical History:  Diagnosis Date  . Anxiety   . Asthma   . Chest pain 11/08/2013  . Diabetes mellitus without complication (HCC)   . Dyslipidemia 11/08/2013  . Dysrhythmia    fast  . GERD (gastroesophageal reflux disease)   . History of anxiety    had anxiety attacks  . History of kidney stones   . HNP (herniated nucleus pulposus), lumbar  05/02/2013  . Hypertension   . Trigeminal neuralgia    Past Surgical History:  Procedure Laterality Date  . ABDOMINAL HYSTERECTOMY    . BRAIN SURGERY     for trigeminal neuralgia  . CARDIAC CATHETERIZATION    . gamma knife  1999   for trigeminal neuralgia  . LEFT HEART CATHETERIZATION WITH CORONARY ANGIOGRAM N/A 10/11/2014   Procedure: LEFT HEART CATHETERIZATION WITH CORONARY ANGIOGRAM;  Surgeon: Lesleigh Noe, MD;  Location: [facility] CATH LAB;  Service: Cardiovascular;  Laterality: N/A;  . LUMBAR LAMINECTOMY/DECOMPRESSION MICRODISCECTOMY  04/19/2003   Right L5-S1 intralaminar laminotomy for excision of herniated disk with the operating microscope  . LUMBAR LAMINECTOMY/DECOMPRESSION MICRODISCECTOMY Right 05/02/2013   Procedure: Right Lumbar Five Sacral One Microdiskectomy;  Surgeon: Temple Pacini, MD;  Location: [facility] NEURO ORS;  Service: Neurosurgery;  Laterality: Right;  LUMBAR LAMINECTOMY/DECOMPRESSION MICRODISCECTOMY 1 LEVEL  . SHOULDER ARTHROSCOPY WITH OPEN ROTATOR CUFF REPAIR Right 05/28/2016   Procedure: SHOULDER ARTHROSCOPY WITH OPEN ROTATOR CUFF REPAIR;  Surgeon: Deeann Saint, MD;  Location: [facility] ORS;  Service: Orthopedics;  Laterality: Right;   Family History  Problem Relation Age of Onset  . CVA Father   . Heart failure Father   . Heart disease Father   . Diabetes Father   .  Hypertension Father   . Ovarian cancer Mother        early 33's  . Cancer Mother   . Heart failure Mother   . Heart failure Maternal Grandmother   . Heart attack Maternal Grandfather   . Diabetes Sister   . Hypertension Sister   . Hypertension Sister    Social History   Social History  . Marital status: Married    Spouse name: N/A  . Number of children: N/A  . Years of education: N/A   Occupational History  . Not on file.   Social History Main Topics  . Smoking status: Never Smoker  . Smokeless tobacco: Never Used  . Alcohol use No  . Drug use: No  . Sexual activity: Yes   Other Topics  Concern  . Not on file   Social History Narrative  . No narrative on file   Interim medical history since last visit reviewed. Allergies and medications reviewed  Review of Systems Per HPI unless specifically indicated above     Objective:    BP 140/78   Pulse 67   Temp (!) 97.5 F (36.4 C) (Oral)   Resp 14   Wt 171 lb 9.6 oz (77.8 kg)   SpO2 98%   BMI 31.39 kg/m   Wt Readings from Last 3 Encounters:  06/04/17 171 lb 9.6 oz (77.8 kg)  11/18/16 166 lb (75.3 kg)  10/06/16 168 lb (76.2 kg)    Physical Exam  Constitutional: She appears well-developed and well-nourished. No distress.  HENT:  Head: Normocephalic and atraumatic.  Right Ear: Tympanic membrane and ear canal normal. No drainage. Tympanic membrane is not erythematous.  Left Ear: Tympanic membrane and ear canal normal. No drainage. Tympanic membrane is not erythematous.  Nose: Mucosal edema and rhinorrhea present. Right sinus exhibits frontal sinus tenderness. Left sinus exhibits maxillary sinus tenderness.  Mouth/Throat: Oropharynx is clear and moist and mucous membranes are normal.  Eyes: EOM are normal. No scleral icterus.  Cardiovascular: Normal rate, regular rhythm and normal heart sounds.   No murmur heard. Pulmonary/Chest: Effort normal and breath sounds normal. No respiratory distress. She has no wheezes.  Abdominal: She exhibits no distension.  Musculoskeletal: Normal range of motion. She exhibits no edema.  Lymphadenopathy:    She has no cervical adenopathy.  Neurological: She is alert. She exhibits normal muscle tone.  Skin: Skin is warm and dry. She is not diaphoretic. No pallor.  Psychiatric: She has a normal mood and affect. Her behavior is normal. Judgment and thought content normal. Her mood appears not anxious. She does not exhibit a depressed mood.    Results for orders placed or performed in visit on 11/10/16  Pulmonary function test  Result Value Ref Range   FVC-Pre 2.65 L   FVC-%Pred-Pre  85 %   FVC-Post 2.78 L   FVC-%Pred-Post 89 %   FVC-%Change-Post 4 %   FEV1-Pre 2.21 L   FEV1-%Pred-Pre 92 %   FEV1-Post 2.39 L   FEV1-%Pred-Post 99 %   FEV1-%Change-Post 7 %   FEV6-Pre 2.57 L   FEV6-%Pred-Pre 86 %   FEV6-Post 2.68 L   FEV6-%Pred-Post 90 %   FEV6-%Change-Post 4 %   Pre FEV1/FVC ratio 84 %   FEV1FVC-%Pred-Pre 107 %   Post FEV1/FVC ratio 86 %   FEV1FVC-%Change-Post 2 %   Pre FEV6/FVC Ratio 99 %   FEV6FVC-%Pred-Pre 103 %   Post FEV6/FVC ratio 100 %   FEV6FVC-%Pred-Post 104 %   FEV6FVC-%Change-Post 0 %  FEF 25-75 Pre 2.82 L/sec   FEF2575-%Pred-Pre 124 %   FEF 25-75 Post 3.40 L/sec   FEF2575-%Pred-Post 149 %   FEF2575-%Change-Post 20 %   RV 1.50 L   RV % pred 80 %   TLC 3.79 L   TLC % pred 79 %   DLCO unc 25.36 ml/min/mmHg   DLCO unc % pred 117 %   DLCO cor 24.98 ml/min/mmHg   DLCO cor % pred 115 %   DL/VA 1.616.18 ml/min/mmHg/L   DL/VA % pred 096136 %      Assessment & Plan:   Problem List Items Addressed This Visit      Cardiovascular and Mediastinum   Hypertension    Dash guidelines, avoid decongestants, NSAIDs; increase CCB; patient to monitor and call if not less than 130      Relevant Medications   amLODipine (NORVASC) 5 MG tablet     Respiratory   Bronchiolitis    Managed by pulmonologist; f/u with him if worsening or limiting activities        Digestive   GERD (gastroesophageal reflux disease)    Cautioned about long term use of PPI       Other Visit Diagnoses    Acute non-recurrent maxillary sinusitis    -  Primary   Relevant Medications   amoxicillin-clavulanate (AUGMENTIN) 875-125 MG tablet       Follow up plan: No Follow-up on file.  An after-visit summary was printed and given to the patient at check-out.  Please see the patient instructions which may contain other information and recommendations beyond what is mentioned above in the assessment and plan.  Meds ordered this encounter  Medications  . amoxicillin-clavulanate  (AUGMENTIN) 875-125 MG tablet    Sig: Take 1 tablet by mouth 2 (two) times daily.    Dispense:  20 tablet    Refill:  0  . amLODipine (NORVASC) 5 MG tablet    Sig: Take 1.5 tablets (7.5 mg total) by mouth daily.    Dispense:  135 tablet    Refill:  1    No orders of the defined types were placed in this encounter.

## 2017-06-04 NOTE — Assessment & Plan Note (Signed)
Cautioned about long term use of PPI

## 2017-06-07 ENCOUNTER — Telehealth: Payer: Self-pay | Admitting: Family Medicine

## 2017-06-08 ENCOUNTER — Telehealth: Payer: Self-pay | Admitting: Family Medicine

## 2017-06-08 NOTE — Telephone Encounter (Signed)
Request for metoprolol succ 50 mg received; DENIED She is on 25 mg; six month supply of that approved on 03/02/17; she should not be out Please resolve with pharmacy

## 2017-06-08 NOTE — Telephone Encounter (Signed)
Left voicemail with pharmacy with current dose

## 2017-06-08 NOTE — Telephone Encounter (Signed)
Copied from CRM #[id number]. Topic: General - Other >> Jun 08, 2017  2:28 PM Everardo PacificMoton, Mylan Lengyel, [place] wrote: Reason for ZOX:[id number]:[facility] [address] [place] has called to check the dose of Metoprolol Succinate for a [employer] Script[employer]becca Ormand. If someone could could give him a call back please

## 2017-06-08 NOTE — Telephone Encounter (Signed)
Pt is requesting that you please resend the metoprolol 25 mg prescription to [facility] States that she did not pick up that script in July and she is now completely out.

## 2017-06-08 NOTE — Telephone Encounter (Signed)
Called patient she has 90 day supply, told her to call pharmacy and ask for refill she should have plenty.

## 2017-07-03 ENCOUNTER — Other Ambulatory Visit: Payer: Self-pay | Admitting: Family Medicine

## 2017-07-05 NOTE — Telephone Encounter (Signed)
Patient is overdue for a visit for her diabetes She needs to be seen this week, not wait until late December I'll send in refill and we look forward to seeing her soon in the office; we'll get fasting labs that day too Thank you

## 2017-07-07 NOTE — Telephone Encounter (Signed)
LVM for pt to call and schedule an appt earlier than 08/07/17

## 2017-07-20 ENCOUNTER — Ambulatory Visit: Payer: 59 | Admitting: Family Medicine

## 2017-07-23 ENCOUNTER — Ambulatory Visit (INDEPENDENT_AMBULATORY_CARE_PROVIDER_SITE_OTHER): Payer: 59 | Admitting: Family Medicine

## 2017-07-23 ENCOUNTER — Encounter: Payer: Self-pay | Admitting: Family Medicine

## 2017-07-23 VITALS — BP 152/84 | HR 95 | Temp 97.9°F | Ht 62.0 in | Wt 171.5 lb

## 2017-07-23 DIAGNOSIS — I10 Essential (primary) hypertension: Secondary | ICD-10-CM

## 2017-07-23 DIAGNOSIS — M2240 Chondromalacia patellae, unspecified knee: Secondary | ICD-10-CM | POA: Insufficient documentation

## 2017-07-23 DIAGNOSIS — E669 Obesity, unspecified: Secondary | ICD-10-CM

## 2017-07-23 DIAGNOSIS — E785 Hyperlipidemia, unspecified: Secondary | ICD-10-CM

## 2017-07-23 DIAGNOSIS — E119 Type 2 diabetes mellitus without complications: Secondary | ICD-10-CM

## 2017-07-23 DIAGNOSIS — J410 Simple chronic bronchitis: Secondary | ICD-10-CM

## 2017-07-23 DIAGNOSIS — Z5181 Encounter for therapeutic drug level monitoring: Secondary | ICD-10-CM | POA: Diagnosis not present

## 2017-07-23 MED ORDER — AMLODIPINE BESYLATE 10 MG PO TABS: 10.0000 mg | ORAL_TABLET | Freq: Every day | ORAL | 3 refills | Status: DC

## 2017-07-23 NOTE — Progress Notes (Signed)
BP (!) 152/84 (BP Location: Left Arm, Patient Position: Sitting, Cuff Size: Large)   Pulse 95   Temp 97.9 F (36.6 C) (Oral)   Ht 5\' 2"  (1.575 m)   Wt 171 lb 8 oz (77.8 kg)   SpO2 97%   BMI 31.37 kg/m    Subjective:    Patient ID: Amanda Lyons, female    DOB: 09-19-1955, 61 y.o.   MRN: [id number]  HPI: Amanda RocherRebecca B Teehan is a 61 y.o. female  Chief Complaint  Patient presents with  . Hypertension  . Hyperlipidemia  . Diabetes  . Immunizations    discuss tdap, declines all other vaccines     HPI  High BP; up to 170s systolic; uses salt shaker, but trying to cut back some No decongestants; no black licorice or black jelly beans She takes meloxicam daily  Type 2 diabetes; no blurred vision or dry mouth Lab Results  Component Value Date   HGBA1C 6.3 (H) 10/02/2016   High cholesterol; not really limiting bacon or sausage; not a whole lot anyway though; eats red meat 3x a week; just one egg or less a week; not much cheese; on statin  Chronic bronchitis; eyes watery; sinus trouble; sees pulmonologist She was on augmentin in October (antibiotic); not doing saline rinses   Depression screen [facility] 2/9 07/23/2017 06/04/2017 10/01/2016 09/25/2016 09/11/2016  Decreased Interest 0 0 0 0 0  Down, Depressed, Hopeless 0 0 0 0 0  PHQ - 2 Score 0 0 0 0 0    Relevant past medical, surgical, family and social history reviewed Past Medical History:  Diagnosis Date  . Anxiety   . Asthma   . Chest pain 11/08/2013  . Diabetes mellitus without complication (HCC)   . Dyslipidemia 11/08/2013  . Dysrhythmia    fast  . GERD (gastroesophageal reflux disease)   . History of anxiety    had anxiety attacks  . History of kidney stones   . HNP (herniated nucleus pulposus), lumbar 05/02/2013  . Hypertension   . Trigeminal neuralgia    Past Surgical History:  Procedure Laterality Date  . ABDOMINAL HYSTERECTOMY    . BRAIN SURGERY     for trigeminal neuralgia  . CARDIAC CATHETERIZATION    .  gamma knife  1999   for trigeminal neuralgia  . LEFT HEART CATHETERIZATION WITH CORONARY ANGIOGRAM N/A 10/11/2014   Procedure: LEFT HEART CATHETERIZATION WITH CORONARY ANGIOGRAM;  Surgeon: Lesleigh NoeHenry W Smith III, MD;  [facility];  Service: Cardiovascular;  Laterality: N/A;  . LUMBAR LAMINECTOMY/DECOMPRESSION MICRODISCECTOMY  04/19/2003   Right L5-S1 intralaminar laminotomy for excision of herniated disk with the operating microscope  . LUMBAR LAMINECTOMY/DECOMPRESSION MICRODISCECTOMY Right 05/02/2013   Procedure: Right Lumbar Five Sacral One Microdiskectomy;  Surgeon: Temple PaciniHenry A Pool, MD;  Location: [facility] NEUR[facility];  Service: Neurosurgery;  Laterality: Right;  LUMBAR LAMINECTOMY/DECOMPRESSION MICRODISCECTOMY 1 LEVEL  . SHOULDER ARTHROSCOPY WITH OPEN ROTATOR CUFF REPAIR Right 05/28/2016   Procedure: SHOULDER ARTHROSCOPY WITH OPEN ROTATOR CUFF REPAIR;  Surgeon: Deeann SaintHoward Miller, MD;  Location: [facility] ORS;  Service: Orthopedics;  Laterality: Right;   Family History  Problem Relation Age of Onset  . CVA Father   . Heart failure Father   . Heart disease Father   . Diabetes Father   . Hypertension Father   . Ovarian cancer Mother        early 1670's  . Cancer Mother   . Heart failure Mother   . Heart failure Maternal Grandmother   .  Heart attack Maternal Grandfather   . Diabetes Sister   . Hypertension Sister   . Hypertension Sister    Social History   Tobacco Use  . Smoking status: Never Smoker  . Smokeless tobacco: Never Used  Substance Use Topics  . Alcohol use: No  . Drug use: No    Interim medical history since last visit reviewed. Allergies and medications reviewed  Review of Systems Per HPI unless specifically indicated above     Objective:    BP (!) 152/84 (BP Location: Left Arm, Patient Position: Sitting, Cuff Size: Large)   Pulse 95   Temp 97.9 F (36.6 C) (Oral)   Ht 5\' 2"  (1.575 m)   Wt 171 lb 8 oz (77.8 kg)   SpO2 97%   BMI 31.37 kg/m   Wt Readings from Last 3  Encounters:  07/23/17 171 lb 8 oz (77.8 kg)  06/04/17 171 lb 9.6 oz (77.8 kg)  11/18/16 166 lb (75.3 kg)    Vitals:   07/23/17 0934  BP: (!) 152/84  Pulse: 95  Temp: 97.9 F (36.6 C)  SpO2: 97%    Physical Exam  Constitutional: She appears well-developed and well-nourished. No distress.  HENT:  Right Ear: Tympanic membrane and ear canal normal.  Left Ear: Tympanic membrane and ear canal normal.  Nose: Rhinorrhea (scant cloudy) present. No mucosal edema.  Mouth/Throat: Oropharynx is clear and moist and mucous membranes are normal. Mucous membranes are not dry. No posterior oropharyngeal erythema.  Eyes: EOM are normal. No scleral icterus.  Neck: No JVD present. No thyromegaly present.  Cardiovascular: Normal rate and regular rhythm.  Pulmonary/Chest: Effort normal and breath sounds normal. No respiratory distress. She has no wheezes. She has no rales.  Musculoskeletal: She exhibits no edema.  Skin: She is not diaphoretic. No pallor.  Psychiatric: She has a normal mood and affect. Her mood appears not anxious. She does not exhibit a depressed mood.   Diabetic Foot Form - Detailed   Diabetic Foot Exam - detailed Diabetic Foot exam was performed with the following findings:  Yes 07/23/2017 10:16 AM  Visual Foot Exam completed.:  Yes  Pulse Foot Exam completed.:  Yes  Right Dorsalis Pedis:  Present Left Dorsalis Pedis:  Present  Sensory Foot Exam Completed.:  Yes Semmes-Weinstein Monofilament Test R Site 1-Great Toe:  Pos L Site 1-Great Toe:  Pos    Comments:  High riding 4th toes; bunion LEFT foot       Assessment & Plan:   Problem List Items Addressed This Visit      Cardiovascular and Mediastinum   Hypertension    Increase amlodipine to 10 mg; try the DASH guidelines, less salt, less processed food; monitor BP at home and call me if not under 130 on top      Relevant Medications   amLODipine (NORVASC) 10 MG tablet     Respiratory   Chronic bronchitis (HCC)     Continue to work with Dr. Sung Amabile        Endocrine   Type 2 diabetes mellitus (HCC) - Primary    Check A1c; foot exam by MD today; eye exam UTD; limit sweets      Relevant Orders   Hemoglobin A1c   Microalbumin / creatinine urine ratio   Lipid panel   Hemoglobin A1c     Other   Obesity (BMI 30.0-34.9)    Work on gradual modest weight loss, try to lose 10 pounds over the next 2-3 months  Medication monitoring encounter    Monitor Cr and K+; monitor SGPT      Relevant Orders   COMPLETE METABOLIC PANEL WITH GFR   Dyslipidemia (Chronic)    Discussed TG, goal less than 150; LDL less than 100 at least and under 70 is ideal; limit foods that originated from cows and pigs      Relevant Orders   Lipid panel       Follow up plan: Return in about 6 months (around 01/21/2018) for twenty minute follow-up with fasting labs.  An after-visit summary was printed and given to the patient at check-out.  Please see the patient instructions which may contain other information and recommendations beyond what is mentioned above in the assessment and plan.  Meds ordered this encounter  Medications  . amLODipine (NORVASC) 10 MG tablet    Sig: Take 1 tablet (10 mg total) by mouth daily.    Dispense:  90 tablet    Refill:  3    Cancel any outstanding refills of the 5 mg strength please    Orders Placed This Encounter  Procedures  . Hemoglobin A1c  . Microalbumin / creatinine urine ratio  . Lipid panel  . Hemoglobin A1c  . COMPLETE METABOLIC PANEL WITH GFR

## 2017-07-23 NOTE — Assessment & Plan Note (Signed)
Check A1c; foot exam by MD today; eye exam UTD; limit sweets

## 2017-07-23 NOTE — Assessment & Plan Note (Signed)
Increase amlodipine to 10 mg; try the DASH guidelines, less salt, less processed food; monitor BP at home and call me if not under 130 on top

## 2017-07-23 NOTE — Patient Instructions (Addendum)
I recommend that you stop the meloxicam because your blood pressure is too high Increase amlodipine to 10 mg daily Please do see your eye doctor regularly, and have your eyes examined every year (or more often per his or her recommendation) Check your feet every night and let me know right away of any sores, infections, numbness, etc. Try to limit sweets, white bread, white rice, white potatoes It is okay with me for you to not check your fingerstick blood sugars (per [employer] Best Practices), unless you are interested and feel it would be helpful for you Try nasal saline mist and then gently blow to clear passages  Food Choices to Lower Your Triglycerides Triglycerides are a type of fat in your blood. High levels of triglycerides can increase the risk of heart disease and stroke. If your triglyceride levels are high, the foods you eat and your eating habits are very important. Choosing the right foods can help lower your triglycerides. What general guidelines do I need to follow?  Lose weight if you are overweight.  Limit or avoid alcohol.  Fill one half of your plate with vegetables and green salads.  Limit fruit to two servings a day. Choose fruit instead of juice.  Make one fourth of your plate whole grains. Look for the word "whole" as the first word in the ingredient list.  Fill one fourth of your plate with lean protein foods.  Enjoy fatty fish (such as salmon, mackerel, sardines, and tuna) three times a week.  Choose healthy fats.  Limit foods high in starch and sugar.  Eat more home-cooked food and less restaurant, buffet, and fast food.  Limit fried foods.  Cook foods using methods other than frying.  Limit saturated fats.  Check ingredient lists to avoid foods with partially hydrogenated oils (trans fats) in them. What foods can I eat? Grains Whole grains, such as whole wheat or whole grain breads, crackers, cereals, and pasta. Unsweetened  oatmeal, bulgur, barley, quinoa, or brown rice. Corn or whole wheat flour tortillas. Vegetables Fresh or frozen vegetables (raw, steamed, roasted, or grilled). Green salads. Fruits All fresh, canned (in natural juice), or frozen fruits. Meat and Other Protein Products Ground beef (85% or leaner), grass-fed beef, or beef trimmed of fat. Skinless chicken or [place]. Ground chicken or [place]. Pork trimmed of fat. All fish and seafood. Eggs. Dried beans, peas, or lentils. Unsalted nuts or seeds. Unsalted canned or dry beans. Dairy Low-fat dairy products, such as skim or 1% milk, 2% or reduced-fat cheeses, low-fat ricotta or cottage cheese, or plain low-fat yogurt. Fats and Oils Tub margarines without trans fats. Light or reduced-fat mayonnaise and salad dressings. Avocado. Safflower, olive, or canola oils. Natural peanut or almond butter. The items listed above may not be a complete list of recommended foods or beverages. Contact your dietitian for more options. What foods are not recommended? Grains White bread. White pasta. White rice. Cornbread. Bagels, pastries, and croissants. Crackers that contain trans fat. Vegetables White potatoes. Corn. Creamed or fried vegetables. Vegetables in a cheese sauce. Fruits Dried fruits. Canned fruit in light or heavy syrup. Fruit juice. Meat and Other Protein Products Fatty cuts of meat. Ribs, chicken wings, bacon, sausage, bologna, salami, chitterlings, fatback, hot dogs, bratwurst, and packaged luncheon meats. Dairy Whole or 2% milk, cream, half-and-half, and cream cheese. Whole-fat or sweetened yogurt. Full-fat cheeses. Nondairy creamers and whipped toppings. Processed cheese, cheese spreads, or cheese curds. Sweets and Desserts Corn syrup, sugars, honey, and molasses.  Candy. Jam and jelly. Syrup. Sweetened cereals. Cookies, pies, cakes, donuts, muffins, and ice cream. Fats and Oils Butter, stick margarine, lard, shortening, ghee, or bacon fat. Coconut,  palm kernel, or palm oils. Beverages Alcohol. Sweetened drinks (such as sodas, lemonade, and fruit drinks or punches). The items listed above may not be a complete list of foods and beverages to avoid. Contact your dietitian for more information. This information is not intended to replace advice given to you by your health care provider. Make sure you discuss any questions you have with your health care provider. Document Released: 05/15/2004 Document Revised: 01/03/2016 Document Reviewed: 06/01/2013 Elsevier Interactive Patient Education  2017 Elsevier Inc.  DASH Eating Plan DASH stands for "Dietary Approaches to Stop Hypertension." The DASH eating plan is a healthy eating plan that has been shown to reduce high blood pressure (hypertension). It may also reduce your risk for type 2 diabetes, heart disease, and stroke. The DASH eating plan may also help with weight loss. What are tips for following this plan? General guidelines  Avoid eating more than 2,300 mg (milligrams) of salt (sodium) a day. If you have hypertension, you may need to reduce your sodium intake to 1,500 mg a day.  Limit alcohol intake to no more than 1 drink a day for nonpregnant women and 2 drinks a day for men. One drink equals 12 oz of beer, 5 oz of wine, or 1 oz of hard liquor.  Work with your health care provider to maintain a healthy body weight or to lose weight. Ask what an ideal weight is for you.  Get at least 30 minutes of exercise that causes your heart to beat faster (aerobic exercise) most days of the week. Activities may include walking, swimming, or biking.  Work with your health care provider or diet and nutrition specialist (dietitian) to adjust your eating plan to your individual calorie needs. Reading food labels  Check food labels for the amount of sodium per serving. Choose foods with less than 5 percent of the Daily Value of sodium. Generally, foods with less than 300 mg of sodium per serving fit  into this eating plan.  To find whole grains, look for the word "whole" as the first word in the ingredient list. Shopping  Buy products labeled as "low-sodium" or "no salt added."  Buy fresh foods. Avoid canned foods and premade or frozen meals. Cooking  Avoid adding salt when cooking. Use salt-free seasonings or herbs instead of table salt or sea salt. Check with your health care provider or pharmacist before using salt substitutes.  Do not fry foods. Cook foods using healthy methods such as baking, boiling, grilling, and broiling instead.  Cook with heart-healthy oils, such as olive, canola, soybean, or sunflower oil. Meal planning   Eat a balanced diet that includes: ? 5 or more servings of fruits and vegetables each day. At each meal, try to fill half of your plate with fruits and vegetables. ? Up to 6-8 servings of whole grains each day. ? Less than 6 oz of lean meat, poultry, or fish each day. A 3-oz serving of meat is about the same size as a deck of cards. One egg equals 1 oz. ? 2 servings of low-fat dairy each day. ? A serving of nuts, seeds, or beans 5 times each week. ? Heart-healthy fats. Healthy fats called Omega-3 fatty acids are found in foods such as flaxseeds and coldwater fish, like sardines, salmon, and mackerel.  Limit how much you eat  of the following: ? Canned or prepackaged foods. ? Food that is high in trans fat, such as fried foods. ? Food that is high in saturated fat, such as fatty meat. ? Sweets, desserts, sugary drinks, and other foods with added sugar. ? Full-fat dairy products.  Do not salt foods before eating.  Try to eat at least 2 vegetarian meals each week.  Eat more home-cooked food and less restaurant, buffet, and fast food.  When eating at a restaurant, ask that your food be prepared with less salt or no salt, if possible. What foods are recommended? The items listed may not be a complete list. Talk with your dietitian about what dietary  choices are best for you. Grains Whole-grain or whole-wheat bread. Whole-grain or whole-wheat pasta. Brown rice. Orpah Cobbatmeal. Quinoa. Bulgur. Whole-grain and low-sodium cereals. Pita bread. Low-fat, low-sodium crackers. Whole-wheat flour tortillas. Vegetables Fresh or frozen vegetables (raw, steamed, roasted, or grilled). Low-sodium or reduced-sodium tomato and vegetable juice. Low-sodium or reduced-sodium tomato sauce and tomato paste. Low-sodium or reduced-sodium canned vegetables. Fruits All fresh, dried, or frozen fruit. Canned fruit in natural juice (without added sugar). Meat and other protein foods Skinless chicken or [place]. Ground chicken or [place]. Pork with fat trimmed off. Fish and seafood. Egg whites. Dried beans, peas, or lentils. Unsalted nuts, nut butters, and seeds. Unsalted canned beans. Lean cuts of beef with fat trimmed off. Low-sodium, lean deli meat. Dairy Low-fat (1%) or fat-free (skim) milk. Fat-free, low-fat, or reduced-fat cheeses. Nonfat, low-sodium ricotta or cottage cheese. Low-fat or nonfat yogurt. Low-fat, low-sodium cheese. Fats and oils Soft margarine without trans fats. Vegetable oil. Low-fat, reduced-fat, or light mayonnaise and salad dressings (reduced-sodium). Canola, safflower, olive, soybean, and sunflower oils. Avocado. Seasoning and other foods Herbs. Spices. Seasoning mixes without salt. Unsalted popcorn and pretzels. Fat-free sweets. What foods are not recommended? The items listed may not be a complete list. Talk with your dietitian about what dietary choices are best for you. Grains Baked goods made with fat, such as croissants, muffins, or some breads. Dry pasta or rice meal packs. Vegetables Creamed or fried vegetables. Vegetables in a cheese sauce. Regular canned vegetables (not low-sodium or reduced-sodium). Regular canned tomato sauce and paste (not low-sodium or reduced-sodium). Regular tomato and vegetable juice (not low-sodium or reduced-sodium).  Rosita FirePickles. Olives. Fruits Canned fruit in a light or heavy syrup. Fried fruit. Fruit in cream or butter sauce. Meat and other protein foods Fatty cuts of meat. Ribs. Fried meat. Tomasa BlaseBacon. Sausage. Bologna and other processed lunch meats. Salami. Fatback. Hotdogs. Bratwurst. Salted nuts and seeds. Canned beans with added salt. Canned or smoked fish. Whole eggs or egg yolks. Chicken or [place] with skin. Dairy Whole or 2% milk, cream, and half-and-half. Whole or full-fat cream cheese. Whole-fat or sweetened yogurt. Full-fat cheese. Nondairy creamers. Whipped toppings. Processed cheese and cheese spreads. Fats and oils Butter. Stick margarine. Lard. Shortening. Ghee. Bacon fat. Tropical oils, such as coconut, palm kernel, or palm oil. Seasoning and other foods Salted popcorn and pretzels. Onion salt, garlic salt, seasoned salt, table salt, and sea salt. Worcestershire sauce. Tartar sauce. Barbecue sauce. Teriyaki sauce. Soy sauce, including reduced-sodium. Steak sauce. Canned and packaged gravies. Fish sauce. Oyster sauce. Cocktail sauce. Horseradish that you find on the shelf. Ketchup. Mustard. Meat flavorings and tenderizers. Bouillon cubes. Hot sauce and Tabasco sauce. Premade or packaged marinades. Premade or packaged taco seasonings. Relishes. Regular salad dressings. Where to find more information:  National Heart, Lung, and Blood Institute: PopSteam.iswww.nhlbi.nih.gov  American Heart  Association: www.heart.org Summary  The DASH eating plan is a healthy eating plan that has been shown to reduce high blood pressure (hypertension). It may also reduce your risk for type 2 diabetes, heart disease, and stroke.  With the DASH eating plan, you should limit salt (sodium) intake to 2,300 mg a day. If you have hypertension, you may need to reduce your sodium intake to 1,500 mg a day.  When on the DASH eating plan, aim to eat more fresh fruits and vegetables, whole grains, lean proteins, low-fat dairy, and  heart-healthy fats.  Work with your health care provider or diet and nutrition specialist (dietitian) to adjust your eating plan to your individual calorie needs. This information is not intended to replace advice given to you by your health care provider. Make sure you discuss any questions you have with your health care provider. Document Released: 07/17/2011 Document Revised: 07/21/2016 Document Reviewed: 07/21/2016 Elsevier Interactive Patient Education  2017 [employer]

## 2017-07-23 NOTE — Assessment & Plan Note (Signed)
Discussed TG, goal less than 150; LDL less than 100 at least and under 70 is ideal; limit foods that originated from cows and pigs

## 2017-07-23 NOTE — Assessment & Plan Note (Signed)
Continue to work with Dr. Sung AmabileSimonds

## 2017-07-23 NOTE — Assessment & Plan Note (Signed)
Monitor Cr and K+; monitor SGPT

## 2017-07-23 NOTE — Assessment & Plan Note (Signed)
Work on gradual modest weight loss, try to lose 10 pounds over the next 2-3 months

## 2017-07-24 LAB — COMPLETE METABOLIC PANEL WITH GFR
AG Ratio: 1.8 (calc) (ref 1.0–2.5)
ALBUMIN MSPROF: 4.2 g/dL (ref 3.6–5.1)
ALT: 36 U/L — ABNORMAL HIGH (ref 6–29)
AST: 23 U/L (ref 10–35)
Alkaline phosphatase (APISO): 140 U/L — ABNORMAL HIGH (ref 33–130)
BUN: 17 mg/dL (ref 7–25)
CALCIUM: 10.2 mg/dL (ref 8.6–10.4)
CO2: 28 mmol/L (ref 20–32)
CREATININE: 0.63 mg/dL (ref 0.50–1.05)
Chloride: 104 mmol/L (ref 98–110)
GFR, EST NON AFRICAN AMERICAN: 98 mL/min/{1.73_m2} (ref >=60)
GFR, Est African American: 114 mL/min/{1.73_m2} (ref >=60)
Globulin: 2.4 g/dL (calc) (ref 1.9–3.7)
Glucose, Bld: 103 mg/dL — ABNORMAL HIGH (ref 65–99)
Potassium: 4.4 mmol/L (ref 3.5–5.3)
Sodium: 140 mmol/L (ref 135–146)
TOTAL PROTEIN: 6.6 g/dL (ref 6.1–8.1)
Total Bilirubin: 0.4 mg/dL (ref 0.2–1.2)

## 2017-07-24 LAB — LIPID PANEL
Cholesterol: 185 mg/dL (ref <200)
HDL: 70 mg/dL (ref >50)
LDL Cholesterol (Calc): 95 mg/dL (calc)
NON-HDL CHOLESTEROL (CALC): 115 mg/dL (ref <130)
Total CHOL/HDL Ratio: 2.6 (calc) (ref <5.0)
Triglycerides: 107 mg/dL (ref <150)

## 2017-07-24 LAB — MICROALBUMIN / CREATININE URINE RATIO
CREATININE, URINE: 63 mg/dL (ref 20–275)
MICROALB/CREAT RATIO: 11 ug/mg{creat} (ref <30)
Microalb, Ur: 0.7 mg/dL

## 2017-07-24 LAB — HEMOGLOBIN A1C
HEMOGLOBIN A1C: 6.2 %{Hb} — AB (ref <5.7)
MEAN PLASMA GLUCOSE: 131 (calc)
eAG (mmol/L): 7.3 (calc)

## 2017-07-27 ENCOUNTER — Telehealth: Payer: Self-pay

## 2017-07-27 DIAGNOSIS — R748 Abnormal levels of other serum enzymes: Secondary | ICD-10-CM

## 2017-07-27 DIAGNOSIS — R7401 Elevation of levels of liver transaminase levels: Secondary | ICD-10-CM

## 2017-07-27 DIAGNOSIS — R740 Nonspecific elevation of levels of transaminase and lactic acid dehydrogenase [LDH]: Principal | ICD-10-CM

## 2017-07-27 NOTE — Telephone Encounter (Signed)
-----  Message from Arnetha Courser, MD sent at 07/27/2017 10:59 AM EST ----- Please let the patient know that she is not spilling excessive protein through her kidneys (good news) Her LDL is 95, and we want it at least under 100; encourage her to try to eat fewer foods that contain saturated fats, and get in more whole grains and fruits and vegetables Her 3 month blood sugar average is excellent; her diabetes is well-controlled Two of her liver enzymes are elevated; does she drink alcohol? Does she take a lot of tylenol? I'd like recheck these in 2 weeks (nonfasting hepatic function panel, please order) and get a liver US (please order); diagnoses for both are elevated ALT and elevated alk phos; thank you

## 2017-07-27 NOTE — Telephone Encounter (Signed)
Called pt no answer. LM for pt informing her of the results below per Dr.Lada. Advised pt to call back with answers about alcohol consumption and Tylenol intake. CRM created. [facility] Future lab orders placed, U/S ordered.

## 2017-07-28 ENCOUNTER — Telehealth: Payer: Self-pay | Admitting: Family Medicine

## 2017-07-28 NOTE — Telephone Encounter (Signed)
Pt given results per Dr Sanda Klein "Please let the patient know that she is not spilling excessive protein through her kidneys (good news) Her LDL is 95, and we want it at least under 100; encourage her to try to eat fewer foods that contain saturated fats, and get in more whole grains and fruits and vegetables; Her 3 month blood sugar average is excellent; her diabetes is well-controlled;  Two of her liver enzymes are elevated; does she drink alcohol? Does she take a lot of tylenol? I'd like recheck these in 2 weeks (nonfasting hepatic function panel, please order) and get a liver US (please order); diagnoses for both are elevated ALT and elevated alk phos; thank you" ;pt states that she doesn't drink but takes tylenol 500 mg 2-3 times a week; pt verbalizes understanding, and agrees to labs and ultrasouind; pt would like a morning appointment on Thursday 08/13/17; please contact her today at [phone] before 1300 to schedule labs and ultrasound; will route to [place] pool

## 2017-08-07 ENCOUNTER — Ambulatory Visit: Payer: 59 | Admitting: Family Medicine

## 2017-08-17 ENCOUNTER — Ambulatory Visit
Admission: RE | Admit: 2017-08-17 | Discharge: 2017-08-17 | Disposition: A | Discharge status: Home / Self Care | Payer: 59 | Source: Ambulatory Visit | Attending: Family Medicine | Admitting: Family Medicine

## 2017-08-17 DIAGNOSIS — R7401 Elevation of levels of liver transaminase levels: Secondary | ICD-10-CM

## 2017-08-17 DIAGNOSIS — R748 Abnormal levels of other serum enzymes: Secondary | ICD-10-CM

## 2017-08-17 DIAGNOSIS — R932 Abnormal findings on diagnostic imaging of liver and biliary tract: Secondary | ICD-10-CM | POA: Insufficient documentation

## 2017-08-17 DIAGNOSIS — R740 Nonspecific elevation of levels of transaminase and lactic acid dehydrogenase [LDH]: Secondary | ICD-10-CM | POA: Insufficient documentation

## 2017-08-19 ENCOUNTER — Ambulatory Visit: Payer: 59 | Admitting: Family Medicine

## 2017-08-21 ENCOUNTER — Ambulatory Visit: Payer: 59 | Admitting: Family Medicine

## 2017-09-29 ENCOUNTER — Other Ambulatory Visit: Payer: Self-pay | Admitting: Family Medicine

## 2017-09-29 NOTE — Telephone Encounter (Signed)
Please contact patient; she was supposed to return for recheck of labs 2 weeks after last sent (in late Dec, early January) Thank you

## 2017-09-29 NOTE — Telephone Encounter (Signed)
Called pt informed her of the need to come in for labs. Pt gave verbal understanding.

## 2017-10-05 ENCOUNTER — Encounter: Payer: Self-pay | Admitting: Pulmonary Disease

## 2017-10-05 ENCOUNTER — Ambulatory Visit: Payer: 59 | Admitting: Pulmonary Disease

## 2017-10-05 VITALS — BP 130/90 | HR 75 | Ht 62.0 in | Wt 169.0 lb

## 2017-10-05 DIAGNOSIS — J4530 Mild persistent asthma, uncomplicated: Secondary | ICD-10-CM | POA: Diagnosis not present

## 2017-10-05 DIAGNOSIS — R0609 Other forms of dyspnea: Secondary | ICD-10-CM | POA: Diagnosis not present

## 2017-10-05 DIAGNOSIS — R05 Cough: Secondary | ICD-10-CM | POA: Diagnosis not present

## 2017-10-05 DIAGNOSIS — R5381 Other malaise: Secondary | ICD-10-CM | POA: Diagnosis not present

## 2017-10-05 DIAGNOSIS — R059 Cough, unspecified: Secondary | ICD-10-CM

## 2017-10-05 MED ORDER — FLUTICASONE FUROATE-VILANTEROL 100-25 MCG/INH IN AEPB: 1.0000 | INHALATION_SPRAY | Freq: Every day | RESPIRATORY_TRACT | 5 refills | Status: DC

## 2017-10-05 MED ORDER — ALBUTEROL SULFATE HFA 108 (90 BASE) MCG/ACT IN AERS: 2.0000 | INHALATION_SPRAY | Freq: Four times a day (QID) | RESPIRATORY_TRACT | 5 refills | Status: AC | PRN

## 2017-10-05 NOTE — Patient Instructions (Signed)
Resume Breo inhaler, 1 inhalation daily Resume albuterol inhaler, 2 puffs as needed for shortness of breath, chest tightness Repeat pulmonary function tests have been ordered Follow-up in 4-6 weeks.  If no improvement with Breo inhaler, will consider cardiopulmonary stress test

## 2017-10-05 NOTE — Progress Notes (Signed)
PULMONARY OFFICE F/U NOTE  Requesting MD/Service: Baruch GoutyMelinda Lada Date of initial consultation: 07/14/15 Reason for consultation: severe intractable cough  PT PROFILE:  62 y.o.  F initially seen 07/14/15 with 5 weeks of persistent cough and S OB initially diagnosed as sinusitis, otitis and bronchitis and treated with amoxicillin and a cough suppressant with minimal benefit.  She was noted to be on an ACE inhibitor.  She was initially treated in pulmonary medicine with levofloxacin, prednisone, cough suppressants.  The ACE inhibitor was discontinued.  Cough persisted on follow-up evaluations.  She was started on ICS/LABA with significant improvement in cough.  She continues to have persistent, not-well-explained exertional dyspnea.  She has not been compliant with Breo inhaler  DATA:  PFTs 10/01/15: poor quality test due to difficulty performing spirometry maneuver. Suggestive of obstruction with significant improvement after bronchodilator challenge CTA chest 09/08/16: no PE, normal. Referred to cardiology.  Echocardiogram 09/23/16: Normal LVEF.  No valvular abnormalities.  Normal right ventricle and right atrium. PFTs 11/10/16:  normal spirometry, low normal to minimally reduced TLC. DLCO normal.   INTERVAL: Last visit 11/18/16.  No major pulmonary or respiratory problems   SUBJ: This is a routine reevaluation.  She has had no major pulmonary problems since last visit.  She continues to describe exertional dyspnea (class II) with little day-to-day variation.  She also reports chest tightness and occasional dry cough.  She has been off the Breo inhaler for several months with no perceived worsening of her symptoms since discontinuation.  She is not using albuterol rescue inhaler at all.  Denies fever, purulent sputum, hemoptysis, LE edema and calf tenderness.   Vitals:   10/05/17 0827 10/05/17 0830  BP:  130/90  Pulse:  75  SpO2:  100%  Weight: 76.7 kg (169 lb)   Height: 5\' 2"  (1.575 m)      EXAM:   Gen: WDWN in NAD, not coughing during this encounter HEENT: NCAT, sclerae white, oropharynx normal Neck: No LAN, no JVD noted Lungs: full BS, normal percussion note, no wheezes or other adventitious sounds Cardiovascular: RRR, no M noted Abdomen: Soft, NT, +BS Ext: no C/C/E Neuro: PERRL, EOMI, motor/sensory grossly intact    DATA:  BMP Latest Ref Rng & Units 07/23/2017 10/02/2016 09/08/2016  Glucose 65 - 99 mg/dL 161(W103(H) 960(A154(H) 540(J123(H)  BUN 7 - 25 mg/dL 17 23 20   Creatinine 0.50 - 1.05 mg/dL 8.110.63 9.140.67 7.820.66  BUN/Creat Ratio 6 - 22 (calc) NOT APPLICABLE 34(H) -  Sodium 135 - 146 mmol/L 140 139 139  Potassium 3.5 - 5.3 mmol/L 4.4 3.9 4.1  Chloride 98 - 110 mmol/L 104 102 101  CO2 20 - 32 mmol/L 28 22 29   Calcium 8.6 - 10.4 mg/dL 95.610.2 9.9 21.310.3   CBC Latest Ref Rng & Units 09/08/2016 01/15/2016 10/09/2014  WBC 3.6 - 11.0 K/uL 9.4 7.0 8.0  Hemoglobin 12.0 - 16.0 g/dL 08.613.6 57.813.3 46.913.5  Hematocrit 35.0 - 47.0 % 40.6 40.7 40.0  Platelets 150 - 440 K/uL 361 333 352.0   CXR: No recent film  IMPRESSION:   Exertional dyspnea of unclear etiology, previously thought to represent mild persistent asthma. Suspect component of physical deconditioning   PLAN:  Resume Breo as previously ordered Resume PRN albuterol inhaler - 2 puffs as needed for shortness of breath  Repeat PFTs have been ordered Follow-up in 4-6 weeks.  If no improvement with Breo inhaler, will consider CPST  Billy Fischeravid Kristofor Michalowski, MD PCCM service Mobile [phone] Pager [phone] 10/05/2017 9:26 AM

## 2017-10-06 ENCOUNTER — Other Ambulatory Visit: Payer: Self-pay

## 2017-10-06 MED ORDER — METFORMIN HCL ER 500 MG PO TB24: 500.0000 mg | ORAL_TABLET | Freq: Every day | ORAL | 1 refills | Status: DC

## 2017-10-06 NOTE — Telephone Encounter (Signed)
90 day supply

## 2017-10-06 NOTE — Telephone Encounter (Signed)
Last Cr normal; Rx approved 

## 2017-10-10 ENCOUNTER — Other Ambulatory Visit: Payer: Self-pay | Admitting: Family Medicine

## 2017-10-12 ENCOUNTER — Telehealth: Payer: Self-pay | Admitting: Pulmonary Disease

## 2017-10-12 NOTE — Telephone Encounter (Signed)
Spoke with pt and walked her through the process of getting a coupon card for [employer]. Nothing further needed.

## 2017-10-12 NOTE — Telephone Encounter (Signed)
Patient cannot get the coupon to print for bre[facility] to [facility] she needs a manufacturer coupon   Patient wants to come to office to pick one up if we are able to print it for her

## 2017-10-29 ENCOUNTER — Ambulatory Visit: Payer: 59

## 2017-11-05 ENCOUNTER — Ambulatory Visit: Payer: 59 | Admitting: Pulmonary Disease

## 2017-11-24 ENCOUNTER — Ambulatory Visit (INDEPENDENT_AMBULATORY_CARE_PROVIDER_SITE_OTHER): Payer: 59 | Admitting: Family Medicine

## 2017-11-24 ENCOUNTER — Encounter: Payer: Self-pay | Admitting: Family Medicine

## 2017-11-24 VITALS — BP 136/70 | HR 73 | Temp 98.2°F | Resp 14 | Ht 62.0 in | Wt 169.8 lb

## 2017-11-24 DIAGNOSIS — E1100 Type 2 diabetes mellitus with hyperosmolarity without nonketotic hyperglycemic-hyperosmolar coma (NKHHC): Secondary | ICD-10-CM | POA: Diagnosis not present

## 2017-11-24 DIAGNOSIS — R072 Precordial pain: Secondary | ICD-10-CM | POA: Diagnosis not present

## 2017-11-24 DIAGNOSIS — E669 Obesity, unspecified: Secondary | ICD-10-CM

## 2017-11-24 DIAGNOSIS — Z01818 Encounter for other preprocedural examination: Secondary | ICD-10-CM | POA: Diagnosis not present

## 2017-11-24 DIAGNOSIS — I10 Essential (primary) hypertension: Secondary | ICD-10-CM | POA: Diagnosis not present

## 2017-11-24 DIAGNOSIS — K219 Gastro-esophageal reflux disease without esophagitis: Secondary | ICD-10-CM

## 2017-11-24 DIAGNOSIS — J410 Simple chronic bronchitis: Secondary | ICD-10-CM | POA: Diagnosis not present

## 2017-11-24 DIAGNOSIS — E66811 Obesity, class 1: Secondary | ICD-10-CM

## 2017-11-24 DIAGNOSIS — Z5181 Encounter for therapeutic drug level monitoring: Secondary | ICD-10-CM | POA: Diagnosis not present

## 2017-11-24 DIAGNOSIS — M75122 Complete rotator cuff tear or rupture of left shoulder, not specified as traumatic: Secondary | ICD-10-CM

## 2017-11-24 DIAGNOSIS — M75102 Unspecified rotator cuff tear or rupture of left shoulder, not specified as traumatic: Secondary | ICD-10-CM

## 2017-11-24 DIAGNOSIS — W891XXD Exposure to tanning bed, subsequent encounter: Secondary | ICD-10-CM | POA: Diagnosis not present

## 2017-11-24 HISTORY — DX: Unspecified rotator cuff tear or rupture of left shoulder, not specified as traumatic: M75.102

## 2017-11-24 NOTE — Assessment & Plan Note (Signed)
Last episode was 6-7 months ago; has been seen by Dr. Tobias AlexanderKatarina Nelson, last stress test over a year ago; will ask her to provide cardiac clearance

## 2017-11-24 NOTE — Assessment & Plan Note (Addendum)
Foot exam by MD today; check A1c prior to surgery

## 2017-11-24 NOTE — Assessment & Plan Note (Signed)
Well-controlled; continue meds, try DASH guidelines 

## 2017-11-24 NOTE — Progress Notes (Signed)
BP 136/70   Pulse 73   Temp 98.2 F (36.8 C) (Oral)   Resp 14   Ht 5\' 2"  (1.575 m)   Wt 169 lb 12.8 oz (77 kg)   SpO2 97%   BMI 31.06 kg/m    Subjective:    Patient ID: Amanda Lyons, female    DOB: 05/09/1956, 62 y.o.   MRN: [id number]  HPI: Amanda Lyons is a 62 y.o. female  Chief Complaint  Patient presents with  . Surgical clearance    Left shoulder tear repair    HPI Patient is here for surgical clearance I reviewed the note from [facility] She has a full thickness tear of her supraspinatus with modest retraction There is mild atrophy of the musculature The infraspinatus has mild changes There is tendinosis of the long head of the biceps, and severe AC arthritis They did her right shoulder a year ago, but they had problems waking her up; they did a nerve block too when they did the surgery; *I advised her to talk with anesthesiologist about this*  Type 2 diabetes; has not checked her sugars in a while; no dry mouth or blurred vision; trying to stay away from sugary foods and drinks Lab Results  Component Value Date   HGBA1C 6.4 (H) 11/24/2017   She has NTG prescribed by Dr. Susa Raring, on file since 2015; sees Dr. Tobias Alexander, [facility]; stress test done March 2018; sees her PRN; last dose NTG -- "I've never really took it"; had a spell at work and took her to the hospital and everything was fine; wasn't a heart attack; that's when they prescribed the NTG  HTN; on three medicines; trying to stay away from salt; knows to avoid decongestants  Type of asthma; seeing lung doctor; on Breo; holding things pretty stable  She had her thyroid checked by Dr. Willeen Cass and he wasn't worried unless it starts growing  Depression screen [facility] 2/9 11/24/2017 07/23/2017 06/04/2017 10/01/2016 09/25/2016  Decreased Interest 0 0 0 0 0  Down, Depressed, Hopeless 0 0 0 0 0  PHQ - 2 Score 0 0 0 0 0    Relevant past medical, surgical, family and social history reviewed Past  Medical History:  Diagnosis Date  . Anxiety   . Asthma   . Chest pain 11/08/2013  . Diabetes mellitus without complication (HCC)   . Dyslipidemia 11/08/2013  . Dysrhythmia    fast  . GERD (gastroesophageal reflux disease)   . History of anxiety    had anxiety attacks  . History of kidney stones   . HNP (herniated nucleus pulposus), lumbar 05/02/2013  . Hypertension   . Left rotator cuff tear 11/24/2017  . Trigeminal neuralgia    Past Surgical History:  Procedure Laterality Date  . ABDOMINAL HYSTERECTOMY    . BRAIN SURGERY     for trigeminal neuralgia  . CARDIAC CATHETERIZATION    . gamma knife  1999   for trigeminal neuralgia  . LEFT HEART CATHETERIZATION WITH CORONARY ANGIOGRAM N/A 10/11/2014   Procedure: LEFT HEART CATHETERIZATION WITH CORONARY ANGIOGRAM;  Surgeon: Lesleigh Noe, MD;  Location: [facility] CATH LAB;  Service: Cardiovascular;  Laterality: N/A;  . LUMBAR LAMINECTOMY/DECOMPRESSION MICRODISCECTOMY  04/19/2003   Right L5-S1 intralaminar laminotomy for excision of herniated disk with the operating microscope  . LUMBAR LAMINECTOMY/DECOMPRESSION MICRODISCECTOMY Right 05/02/2013   Procedure: Right Lumbar Five Sacral One Microdiskectomy;  Surgeon: Temple Pacini, MD;  Location: [facility] NEURO ORS;  Service: Neurosurgery;  Laterality: Right;  LUMBAR LAMINECTOMY/DECOMPRESSION MICRODISCECTOMY 1 LEVEL  . SHOULDER ARTHROSCOPY WITH OPEN ROTATOR CUFF REPAIR Right 05/28/2016   Procedure: SHOULDER ARTHROSCOPY WITH OPEN ROTATOR CUFF REPAIR;  Surgeon: Deeann Saint, MD;  Location: [facility] ORS;  Service: Orthopedics;  Laterality: Right;   Family History  Problem Relation Age of Onset  . CVA Father   . Heart failure Father   . Heart disease Father   . Diabetes Father   . Hypertension Father   . Ovarian cancer Mother        early 2's  . Cancer Mother   . Heart failure Mother   . Heart failure Maternal Grandmother   . Heart attack Maternal Grandfather   . Diabetes Sister   . Hypertension Sister     . Hypertension Sister    Social History   Tobacco Use  . Smoking status: Never Smoker  . Smokeless tobacco: Never Used  Substance Use Topics  . Alcohol use: No  . Drug use: No  MD note: confirmed NEVER smoker  Interim medical history since last visit reviewed. Allergies and medications reviewed  Review of Systems  Constitutional: Negative for chills, fever and unexpected weight change.  HENT: Negative for hearing loss.   Eyes: Negative for visual disturbance.  Respiratory: Negative for cough and wheezing.   Cardiovascular: Negative for chest pain, palpitations and leg swelling.  Gastrointestinal: Negative for blood in stool.  Endocrine: Negative for polydipsia.  Genitourinary: Negative for dysuria and hematuria.  Musculoskeletal: Positive for arthralgias (left shoulder).  Skin:       No boils, no hx of MRSA  Allergic/Immunologic: Negative for food allergies.  Neurological: Negative for tremors.  Hematological: Does not bruise/bleed easily.  Psychiatric/Behavioral: Negative for dysphoric mood.   Per HPI unless specifically indicated above     Objective:    BP 136/70   Pulse 73   Temp 98.2 F (36.8 C) (Oral)   Resp 14   Ht 5\' 2"  (1.575 m)   Wt 169 lb 12.8 oz (77 kg)   SpO2 97%   BMI 31.06 kg/m   Wt Readings from Last 3 Encounters:  11/24/17 169 lb 12.8 oz (77 kg)  10/05/17 169 lb (76.7 kg)  07/23/17 171 lb 8 oz (77.8 kg)    Physical Exam  Constitutional: She appears well-developed and well-nourished. No distress.  HENT:  Head: Normocephalic and atraumatic.  Eyes: EOM are normal. No scleral icterus.  Neck: No thyromegaly present.  Cardiovascular: Normal rate, regular rhythm and normal heart sounds.  No murmur heard. Pulmonary/Chest: Effort normal and breath sounds normal. No respiratory distress. She has no wheezes.  Abdominal: Soft. Bowel sounds are normal. She exhibits no distension.  Musculoskeletal: She exhibits no edema.  Neurological: She is alert.  She exhibits normal muscle tone.  Skin: Skin is warm and dry. She is not diaphoretic. No pallor.  Psychiatric: She has a normal mood and affect. Her behavior is normal. Judgment and thought content normal.   Diabetic Foot Form - Detailed   Diabetic Foot Exam - detailed Diabetic Foot exam was performed with the following findings:  Yes 11/24/2017  4:34 PM  Visual Foot Exam completed.:  Yes  Pulse Foot Exam completed.:  Yes  Right Dorsalis Pedis:  Present Left Dorsalis Pedis:  Present  Sensory Foot Exam Completed.:  Yes Semmes-Weinstein Monofilament Test R Site 1-Great Toe:  Pos L Site 1-Great Toe:  Pos          Assessment & Plan:   Problem List Items  Addressed This Visit      Cardiovascular and Mediastinum   Hypertension (Chronic)    Well-controlled; continue meds, try DASH guidelines        Respiratory   Chronic bronchitis (HCC) (Chronic)    Well-controlled with Breo; rare to no rescue inhaler use        Digestive   GERD (gastroesophageal reflux disease)    Controlled with medicine; advised of risks of PPI        Endocrine   Type 2 diabetes mellitus (HCC)    Foot exam by MD today; check A1c prior to surgery      Relevant Orders   Lipid panel (Completed)   Hemoglobin A1c (Completed)     Musculoskeletal and Integument   Left rotator cuff tear    Reviewed the orthopaedist's note from last week; planning for surgery        Other   Obesity (BMI 30.0-34.9)    Working on this; down a few pounds over the last few months; increase steps, build up gradually and safely; increase water intake      Relevant Orders   Lipid panel (Completed)   Medication monitoring encounter    Check liver and kidneys      Relevant Orders   COMPLETE METABOLIC PANEL WITH GFR (Completed)   Chest pain    Last episode was 6-7 months ago; has been seen by Dr. Tobias AlexanderKatarina Nelson, last stress test over a year ago; will ask her to provide cardiac clearance       Other Visit Diagnoses     Pre-op exam    -  Primary   will get labs today; I believe once labs are back that she may proceed with surgery, barring any unexpected findings; CARDIAC clearance to come from cardiologis   Relevant Orders   EKG 12-Lead (Completed)   CBC with Differential/Platelet (Completed)   Urinalysis w microscopic + reflex cultur   DG Chest 2 View (Completed)   Exposure to tanning bed, subsequent encounter       cautioned about possible skin cancer, early ageing, etc.       Follow up plan: No follow-ups on file.  An after-visit summary was printed and given to the patient at check-out.  Please see the patient instructions which may contain other information and recommendations beyond what is mentioned above in the assessment and plan.  No orders of the defined types were placed in this encounter.   Orders Placed This Encounter  Procedures  . DG Chest 2 View  . Lipid panel  . Hemoglobin A1c  . COMPLETE METABOLIC PANEL WITH GFR  . CBC with Differential/Platelet  . Urinalysis w microscopic + reflex cultur  . Urinalysis w microscopic + reflex cultur  . REFLEXIVE URINE CULTURE  . Alkaline Phosphatase Isoenzymes  . EKG 12-Lead

## 2017-11-24 NOTE — Assessment & Plan Note (Signed)
Check liver and kidneys 

## 2017-11-24 NOTE — Assessment & Plan Note (Signed)
Reviewed the orthopaedist's note from last week; planning for surgery

## 2017-11-24 NOTE — Assessment & Plan Note (Signed)
Controlled with medicine; advised of risks of PPI

## 2017-11-24 NOTE — Assessment & Plan Note (Signed)
Working on this; down a few pounds over the last few months; increase steps, build up gradually and safely; increase water intake

## 2017-11-24 NOTE — Assessment & Plan Note (Signed)
Well-controlled with Breo; rare to no rescue inhaler use

## 2017-11-24 NOTE — Patient Instructions (Addendum)
Check out the information at familydoctor.org entitled "Nutrition for Weight Loss: What You Need to Know about Fad Diets" Try to lose between 1-2 pounds per week by taking in fewer calories and burning off more calories You can succeed by limiting portions, limiting foods dense in calories and fat, becoming more active, and drinking 8 glasses of water a day (64 ounces) Don't skip meals, especially breakfast, as skipping meals may alter your metabolism Do not use over-the-counter weight loss pills or gimmicks that claim rapid weight loss A healthy BMI (or body mass index) is between 18.5 and 24.9 You can calculate your ideal BMI at the NIH website [employer]huhttp://www.nhlbi.nih.gov/health/educational/lose_wt/BMI/bmicalc.htm Try to follow the DASH guidelines (DASH stands for Dietary Approaches to Stop Hypertension). Try to limit the sodium in your diet to no more than 1,500mg  of sodium per day. Certainly try to not exceed 2,000 mg per day at the very most. Do not add salt when cooking or at the table.  Check the sodium amount on labels when shopping, and choose items lower in sodium when given a choice. Avoid or limit foods that already contain a lot of sodium. Eat a diet rich in fruits and vegetables and whole grains, and try to lose weight if overweight or obese Caution: prolonged use of proton pump inhibitors like omeprazole (Prilosec), pantoprazole (Protonix), esomeprazole (Nexium), and others like Dexilant and Aciphex may increase your risk of pneumonia, Clostridium difficile colitis, osteoporosis, anemia and other health complications Try to limit or avoid triggers like coffee, caffeinated beverages, onions, chocolate, spicy foods, peppermint, acidic foods like pizza, spaghetti sauce, and orange juice Lose weight if you are overweight or obese Try elevating the head of your bed by placing a small wedge between your mattress and box springs to keep acid in the stomach at night instead of coming up into your  esophagus You can have the chest xray done at your convenience this week We'll have your heart doctor provide cardiac clearance for you, and I'll give medical clearance once your labs and chest xray are back (if normal) Please call your heart doctor to get that taken care of

## 2017-11-26 ENCOUNTER — Ambulatory Visit: Payer: 59

## 2017-11-30 ENCOUNTER — Ambulatory Visit
Admission: RE | Admit: 2017-11-30 | Discharge: 2017-11-30 | Disposition: A | Discharge status: Home / Self Care | Payer: 59 | Source: Ambulatory Visit | Attending: Family Medicine | Admitting: Family Medicine

## 2017-11-30 ENCOUNTER — Telehealth: Payer: Self-pay

## 2017-11-30 DIAGNOSIS — Z01818 Encounter for other preprocedural examination: Secondary | ICD-10-CM | POA: Diagnosis not present

## 2017-11-30 DIAGNOSIS — J42 Unspecified chronic bronchitis: Secondary | ICD-10-CM | POA: Diagnosis present

## 2017-11-30 NOTE — Telephone Encounter (Signed)
Pt is scheduled to see Jacolyn ReedyMichele Lenze, PA on Dec 21, 2017 for pre-op clearance. I will let Dr. Smiley HousemanHoward Miller's office know of appt with Cardiology.

## 2017-11-30 NOTE — Telephone Encounter (Signed)
   Primary Cardiologist:Katarina Delton SeeNelson, MD (last seen 09/2016)  Chart reviewed as part of pre-operative protocol coverage. Because of Lurena JoinerRebecca B Pancoast's past medical history and time since last visit, he/she will require a follow-up visit in order to better assess preoperative cardiovascular risk.  Pre-op covering staff: - Please schedule appointment and call patient to inform them. - Please contact requesting surgeon's office via preferred method (i.e, phone, fax) to inform them of need for appointment prior to surgery.  FairfieldBhavinkumar Allin Frix, [place]  11/30/2017, 1:37 PM

## 2017-11-30 NOTE — Telephone Encounter (Signed)
   [facility] Pre-operative Risk Assessment    Request for surgical clearance:  1. What type of surgery is being performed?    - LEFT arthroscopy with rotator cuff  2. When is this surgery scheduled?    - Date to be determined  3. Are there any medications that need to be held prior to surgery and how long?   - Please assist  Practice name and name of physician performing surgery?    - [facility]   - Dr. Earnestine Leys  4. What is your office phone and fax number?    -Phone: [phone]    - Fax: [phone]  5. Anesthesia type (None, local, MAC, general) ?    Vivi Barrack J 11/30/2017, 10:55 AM  _________________________________________________________________   (provider comments below)

## 2017-12-01 LAB — COMPLETE METABOLIC PANEL WITH GFR
AG Ratio: 1.8 (calc) (ref 1.0–2.5)
ALT: 30 U/L — AB (ref 6–29)
AST: 21 U/L (ref 10–35)
Albumin: 4.4 g/dL (ref 3.6–5.1)
Alkaline phosphatase (APISO): 139 U/L — ABNORMAL HIGH (ref 33–130)
BUN: 19 mg/dL (ref 7–25)
CALCIUM: 10 mg/dL (ref 8.6–10.4)
CHLORIDE: 104 mmol/L (ref 98–110)
CO2: 28 mmol/L (ref 20–32)
Creat: 0.62 mg/dL (ref 0.50–0.99)
GFR, EST AFRICAN AMERICAN: 114 mL/min/{1.73_m2} (ref >=60)
GFR, EST NON AFRICAN AMERICAN: 98 mL/min/{1.73_m2} (ref >=60)
GLUCOSE: 101 mg/dL (ref 65–139)
Globulin: 2.4 g/dL (calc) (ref 1.9–3.7)
Potassium: 3.8 mmol/L (ref 3.5–5.3)
Sodium: 138 mmol/L (ref 135–146)
TOTAL PROTEIN: 6.8 g/dL (ref 6.1–8.1)
Total Bilirubin: 0.3 mg/dL (ref 0.2–1.2)

## 2017-12-01 LAB — URINALYSIS W MICROSCOPIC + REFLEX CULTURE
BACTERIA UA: NONE SEEN /HPF
Bilirubin Urine: NEGATIVE
Glucose, UA: NEGATIVE
HYALINE CAST: NONE SEEN /LPF
Hgb urine dipstick: NEGATIVE
Ketones, ur: NEGATIVE
Leukocyte Esterase: NEGATIVE
Nitrites, Initial: NEGATIVE
PH: 6 (ref 5.0–8.0)
Protein, ur: NEGATIVE
RBC / HPF: NONE SEEN /HPF (ref 0–2)
SPECIFIC GRAVITY, URINE: 1.021 (ref 1.001–1.03)
Squamous Epithelial / LPF: NONE SEEN /HPF (ref <=5)
WBC, UA: NONE SEEN /HPF (ref 0–5)

## 2017-12-01 LAB — ALKALINE PHOSPHATASE ISOENZYMES
ALKALINE PHOSPHATASE (APISO): 147 U/L — AB (ref 33–130)
BONE ISOENZYMES (ALP ISO): 60 % (ref 28–66)
INTESTINAL ISOENZYMES (ALP ISO): 7 % (ref 1–24)
LIVER ISOENZYMES (ALP ISO): 33 % (ref 25–69)

## 2017-12-01 LAB — CBC WITH DIFFERENTIAL/PLATELET
BASOS ABS: 37 {cells}/uL (ref 0–200)
BASOS PCT: 0.5 %
EOS ABS: 183 {cells}/uL (ref 15–500)
EOS PCT: 2.5 %
HEMATOCRIT: 40 % (ref 35.0–45.0)
HEMOGLOBIN: 13.6 g/dL (ref 11.7–15.5)
LYMPHS ABS: 1971 {cells}/uL (ref 850–3900)
MCH: 28.9 pg (ref 27.0–33.0)
MCHC: 34 g/dL (ref 32.0–36.0)
MCV: 85.1 fL (ref 80.0–100.0)
MPV: 9.3 fL (ref 7.5–12.5)
Monocytes Relative: 9.3 %
NEUTROS ABS: 4431 {cells}/uL (ref 1500–7800)
Neutrophils Relative %: 60.7 %
Platelets: 371 10*3/uL (ref 140–400)
RBC: 4.7 10*6/uL (ref 3.80–5.10)
RDW: 12.7 % (ref 11.0–15.0)
Total Lymphocyte: 27 %
WBC mixed population: 679 cells/uL (ref 200–950)
WBC: 7.3 10*3/uL (ref 3.8–10.8)

## 2017-12-01 LAB — LIPID PANEL
Cholesterol: 193 mg/dL (ref <200)
HDL: 71 mg/dL (ref >50)
LDL Cholesterol (Calc): 96 mg/dL (calc)
NON-HDL CHOLESTEROL (CALC): 122 mg/dL (ref <130)
Total CHOL/HDL Ratio: 2.7 (calc) (ref <5.0)
Triglycerides: 156 mg/dL — ABNORMAL HIGH (ref <150)

## 2017-12-01 LAB — HEMOGLOBIN A1C
EAG (MMOL/L): 7.6 (calc)
HEMOGLOBIN A1C: 6.4 %{Hb} — AB (ref <5.7)
MEAN PLASMA GLUCOSE: 137 (calc)

## 2017-12-01 LAB — NO CULTURE INDICATED

## 2017-12-03 ENCOUNTER — Other Ambulatory Visit: Payer: Self-pay

## 2017-12-03 DIAGNOSIS — R748 Abnormal levels of other serum enzymes: Secondary | ICD-10-CM

## 2017-12-06 ENCOUNTER — Other Ambulatory Visit: Payer: Self-pay | Admitting: Family Medicine

## 2017-12-11 ENCOUNTER — Other Ambulatory Visit: Payer: Self-pay | Admitting: Family Medicine

## 2017-12-17 DIAGNOSIS — Z01818 Encounter for other preprocedural examination: Secondary | ICD-10-CM | POA: Insufficient documentation

## 2017-12-17 NOTE — Progress Notes (Signed)
Cardiology Office Note    Date:  12/21/2017   ID:  Amanda Lyons, DOB 15-Mar-1956, MRN [id number]  PCP:  Kerman Passey, MD  Cardiologist: Tobias Alexander, MD  Chief Complaint  Patient presents with  . Pre-op Exam    History of Present Illness:  Amanda Lyons is a 62 y.o. female with history of hypertensive urgency, hyperlipidemia, chest pain and shortness of breath with normal cath in 2016, repeat nuclear stress test for chest pain 10/2016 showed no ischemia but hypertensive response and amlodipine 2.5 mg added daily.  Was also given colchicine for possible pericarditis.  2D echo 09/23/2016 normal LVEF 60 to 65% with grade 1 DD.  Patient is here today for cardiac clearance before undergoing shoulder arthroscopy with rotator cuff repair by Dr. Deeann Saint 01/06/2018.Denies any problems with chest pain, dyspnea, dizziness or presyncope.  She has some dyspnea on exertion secondary to her asthma relieved with inhalers.  She works with the [employer] and does a lot of walking and heavy lifting.  She does not exercise outside of work.  EKG done by PCP last month was normal.  Labs were also done which look good except for hemoglobin A1c of 6.4 LDL of 96, triglycerides of 156.    Past Medical History:  Diagnosis Date  . Anxiety   . Asthma   . Chest pain 11/08/2013  . Diabetes mellitus without complication (HCC)   . Dyslipidemia 11/08/2013  . Dysrhythmia    fast  . GERD (gastroesophageal reflux disease)   . History of anxiety    had anxiety attacks  . History of kidney stones   . HNP (herniated nucleus pulposus), lumbar 05/02/2013  . Hypertension   . Left rotator cuff tear 11/24/2017  . Trigeminal neuralgia     Past Surgical History:  Procedure Laterality Date  . ABDOMINAL HYSTERECTOMY    . BRAIN SURGERY     for trigeminal neuralgia  . CARDIAC CATHETERIZATION    . gamma knife  1999   for trigeminal neuralgia  . LEFT HEART CATHETERIZATION WITH CORONARY ANGIOGRAM N/A 10/11/2014    Procedure: LEFT HEART CATHETERIZATION WITH CORONARY ANGIOGRAM;  Surgeon: Lesleigh Noe, MD;  [facility] CATH LAB;  Service: Cardiovascular;  Laterality: N/A;  . LUMBAR LAMINECTOMY/DECOMPRESSION MICRODISCECTOMY  04/19/2003   Right L5-S1 intralaminar laminotomy for excision of herniated disk with the operating microscope  . LUMBAR LAMINECTOMY/DECOMPRESSION MICRODISCECTOMY Right 05/02/2013   Procedure: Right Lumbar Five Sacral One Microdiskectomy;  Surgeon: Temple Pacini, MD;  Location: [facility] NEURO ORS;  Service: Neurosurgery;  Laterality: Right;  LUMBAR LAMINECTOMY/DECOMPRESSION MICRODISCECTOMY 1 LEVEL  . SHOULDER ARTHROSCOPY WITH OPEN ROTATOR CUFF REPAIR Right 05/28/2016   Procedure: SHOULDER ARTHROSCOPY WITH OPEN ROTATOR CUFF REPAIR;  Surgeon: Deeann Saint, MD;  Location: [facility] ORS;  Service: Orthopedics;  Laterality: Right;    Current Medications: Current Meds  Medication Sig  . albuterol (PROVENTIL HFA;VENTOLIN HFA) 108 (90 Base) MCG/ACT inhaler Inhale 2 puffs into the lungs every 6 (six) hours as needed for wheezing or shortness of breath.  Marland Kitchen amLODipine (NORVASC) 10 MG tablet Take 1 tablet (10 mg total) by mouth daily.  Marland Kitchen aspirin EC 81 MG EC tablet Take 1 tablet (81 mg total) by mouth daily.  Marland Kitchen atorvastatin (LIPITOR) 10 MG tablet TAKE 1 TABLET BY MOUTH EVERYDAY AT BEDTIME  . Cyanocobalamin (VITAMIN B-12 ER PO) Take by mouth daily.  . fluticasone furoate-vilanterol (BREO ELLIPTA) 100-25 MCG/INH AEPB Inhale 1 puff into the lungs daily.  Marland Kitchen  losartan (COZAAR) 100 MG tablet TAKE 1 TABLET (100 MG TOTAL) BY MOUTH DAILY. THIS REPLACES THE COMBO BP MEDICINE  . meloxicam (MOBIC) 15 MG tablet Take 15 mg by mouth daily.  . metFORMIN (GLUCOPHAGE-XR) 500 MG 24 hr tablet Take 1 tablet (500 mg total) by mouth daily with supper.  . metoprolol succinate (TOPROL-XL) 25 MG 24 hr tablet TAKE 1 TABLET (25 MG TOTAL) BY MOUTH DAILY.  . nitroGLYCERIN (NITROSTAT) 0.4 MG SL tablet Place 1 tablet (0.4 mg total) under  the tongue every 5 (five) minutes as needed for chest pain (CP or SOB).  Marland Kitchen omeprazole (PRILOSEC) 40 MG capsule Take 40 mg by mouth daily.   Marland Kitchen oxyCODONE-Acetaminophen (PERCOCET PO) Take 1 tablet by mouth as needed (for pain).  . [DISCONTINUED] amLODipine (NORVASC) 10 MG tablet Take 1 tablet (10 mg total) by mouth daily.     Allergies:   Prednisone and Sulfa antibiotics   Social History   Socioeconomic History  . Marital status: Married    Spouse name: Not on file  . Number of children: Not on file  . Years of education: Not on file  . Highest education level: Not on file  Occupational History  . Not on file  Social Needs  . Financial resource strain: Not on file  . Food insecurity:    Worry: Not on file    Inability: Not on file  . Transportation needs:    Medical: Not on file    Non-medical: Not on file  Tobacco Use  . Smoking status: Never Smoker  . Smokeless tobacco: Never Used  Substance and Sexual Activity  . Alcohol use: No  . Drug use: No  . Sexual activity: Yes    Birth control/protection: Post-menopausal  Lifestyle  . Physical activity:    Days per week: Not on file    Minutes per session: Not on file  . Stress: Not on file  Relationships  . Social connections:    Talks on phone: Not on file    Gets together: Not on file    Attends religious service: Not on file    Active member of club or organization: Not on file    Attends meetings of clubs or organizations: Not on file    Relationship status: Not on file  Other Topics Concern  . Not on file  Social History Narrative  . Not on file     Family History:  The patient's family history includes CVA in her father; Cancer in her mother; Diabetes in her father and sister; Heart attack in her maternal grandfather; Heart disease in her father; Heart failure in her father, maternal grandmother, and mother; Hypertension in her father, sister, and sister; Ovarian cancer in her mother.   ROS:   Please see the  history of present illness.    Review of Systems  Constitution: Negative.  HENT: Negative.   Eyes: Negative.   Cardiovascular: Positive for dyspnea on exertion.  Respiratory: Negative.   Hematologic/Lymphatic: Negative.   Musculoskeletal: Negative.  Negative for joint pain.  Gastrointestinal: Negative.   Genitourinary: Negative.   Neurological: Negative.    All other systems reviewed and are negative.   PHYSICAL EXAM:   VS:  BP 132/74   Pulse 63   Ht  (1.575 m)   Wt 171 lb (77.6 kg)   SpO2 97%   BMI 31.28 kg/m   Physical Exam  GEN: Well nourished, well developed, in no acute distress  Neck: no JVD, carotid bruits,  or masses Cardiac:RRR; no murmurs, rubs, or gallops  Respiratory:  clear to auscultation bilaterally, normal work of breathing GI: soft, nontender, nondistended, + BS Ext: without cyanosis, clubbing, or edema, Good distal pulses bilaterally Neuro:  Alert and Oriented x 3 Psych: euthymic mood, full affect  Wt Readings from Last 3 Encounters:  12/21/17 171 lb (77.6 kg)  11/24/17 169 lb 12.8 oz (77 kg)  10/05/17 169 lb (76.7 kg)      Studies/Labs Reviewed:   EKG:  EKG is not ordered today.  EKG reviewed from 11/24/2017 normal sinus rhythm, no acute change Recent Labs: 11/24/2017: ALT 30; BUN 19; Creat 0.62; Hemoglobin 13.6; Platelets 371; Potassium 3.8; Sodium 138   Lipid Panel    Component Value Date/Time   CHOL 193 11/24/2017 1155   CHOL 173 10/02/2016 1017   TRIG 156 (H) 11/24/2017 1155   HDL 71 11/24/2017 1155   HDL 55 10/02/2016 1017   CHOLHDL 2.7 11/24/2017 1155   LDLCALC 96 11/24/2017 1155    Additional studies/ records that were reviewed today include:  2D echo 2/13/2018Study Conclusions   - Left ventricle: The cavity size was at the upper limits of   normal. Wall thickness was increased in a pattern of mild LVH.   Systolic function was normal. The estimated ejection fraction was   in the range of 60% to 65%. Wall motion was normal;  there were no   regional wall motion abnormalities. Doppler parameters are   consistent with abnormal left ventricular relaxation (grade 1   diastolic dysfunction). - Right ventricle: The cavity size was normal. Systolic function   was normal.   Chest CT 1/29/2018IMPRESSION: 1. No evidence of pulmonary embolus. 2. Minimal bibasilar atelectasis noted.  Lungs otherwise clear. 3. Borderline cardiomegaly.     Electronically Signed   By: Roanna Raider M.D.   On: 09/08/2016 23:42   NST 3/2018Study Highlights      Nuclear stress EF: 67%.  Blood pressure demonstrated a hypertensive response to exercise.  There was no ST segment deviation noted during stress.  The study is normal.  This is a low risk study.  The left ventricular ejection fraction is hyperdynamic (>65%).   Normal stress nuclear study with no ischemia or infarction; EF 67 with normal wall motion.         ASSESSMENT:    1. Preoperative clearance   2. Chest pain, unspecified type   3. Essential hypertension   4. Dyslipidemia      PLAN:  In order of problems listed above:  Preoperative clearance for left arthroscopy with rotator cuff repair by Dr. Deeann Saint 01/06/18.  No recent chest pain, normal cath in 2016 and no ischemia on nuclear stress test 2018.  Revised cardiac index 0.4% so no further cardiac work-up needed.  Functional capacity is 7.68 minutes.  Can proceed with rotator cuff surgery without further cardiac work-up. According to the Revised Cardiac Risk Index (RCRI), her Perioperative Risk of Major Cardiac Event is (%): 0.4  Her Functional Capacity in METs is: 7.68 according to the Duke Activity Status Index (DASI).    Long history of chest pain with normal cardiac cath in 2016, nuclear stress test 2018 without ischemia, normal LVEF.  No chest pain since last seen here.  Recommend 30 minutes of exercise daily.  Essential hypertension blood pressure well controlled on losartan, amlodipine  and metoprolol  Dyslipidemia on low-dose Lipitor.  LDL 92, managed by PCP    Medication Adjustments/Labs and Tests Ordered: Current medicines  are reviewed at length with the patient today.  Concerns regarding medicines are outlined above.  Medication changes, Labs and Tests ordered today are listed in the Patient Instructions below. Patient Instructions  Medication Instructions:  Your physician recommends that you continue on your current medications as directed. Please refer to the Current Medication list given to you today.  Labwork: NONE  Testing/Procedures: NONE  Follow-Up: Your physician wants you to follow-up in: 12 months with Dr. Delton See. You will receive a reminder letter in the mail two months in advance. If you don't receive a letter, please call our office to schedule the follow-up appointment.   If you need a refill on your cardiac medications before your next appointment, please call your pharmacy.       Amanda Clan, PA-C  12/21/2017 8:46 AM    [facility] [address] [place], [place], [place]  [address] Phone: [phone]; Fax: [phone]

## 2017-12-21 ENCOUNTER — Encounter: Payer: Self-pay | Admitting: Physician Assistant

## 2017-12-21 ENCOUNTER — Ambulatory Visit: Payer: 59 | Admitting: Physician Assistant

## 2017-12-21 VITALS — BP 132/74 | HR 63 | Ht 62.0 in | Wt 171.0 lb

## 2017-12-21 DIAGNOSIS — Z01818 Encounter for other preprocedural examination: Secondary | ICD-10-CM

## 2017-12-21 DIAGNOSIS — I10 Essential (primary) hypertension: Secondary | ICD-10-CM | POA: Diagnosis not present

## 2017-12-21 DIAGNOSIS — R079 Chest pain, unspecified: Secondary | ICD-10-CM | POA: Diagnosis not present

## 2017-12-21 DIAGNOSIS — E785 Hyperlipidemia, unspecified: Secondary | ICD-10-CM

## 2017-12-21 MED ORDER — AMLODIPINE BESYLATE 10 MG PO TABS: 10.0000 mg | ORAL_TABLET | Freq: Every day | ORAL | 3 refills | Status: DC

## 2017-12-21 NOTE — Patient Instructions (Signed)
Medication Instructions:  Your physician recommends that you continue on your current medications as directed. Please refer to the Current Medication list given to you today.  Labwork: NONE  Testing/Procedures: NONE  Follow-Up: Your physician wants you to follow-up in: 12 months with Dr. Nelson. You will receive a reminder letter in the mail two months in advance. If you don't receive a letter, please call our office to schedule the follow-up appointment.   If you need a refill on your cardiac medications before your next appointment, please call your pharmacy.    

## 2017-12-22 ENCOUNTER — Telehealth: Payer: Self-pay | Admitting: Cardiology

## 2017-12-22 NOTE — Telephone Encounter (Signed)
New Message:       Aundra Millet is calling from [facility] for the clearance results from pt's appt on yesterday. Please fax clearance results to [phone]

## 2017-12-22 NOTE — Telephone Encounter (Signed)
Faxed Herma Carson PA-C office note indicating that the pt is cleared for her upcoming procedure, via Epic fax function to [facility], with confirmed fax number of Fax: [phone].  [facility] was also contacted that the clearance note is documented in Herma Carson PA-C assessment and plan portion of her OV note faxed.  Representative at [facility] verbalized understanding and agrees with this plan.  Below is Herma Carson PA-C note indicating clearance.       PLAN:  In order of problems listed above:  Preoperative clearance for left arthroscopy with rotator cuff repair by Dr. Deeann Saint 01/06/18.  No recent chest pain, normal cath in 2016 and no ischemia on nuclear stress test 2018.  Revised cardiac index 0.4% so no further cardiac work-up needed.  Functional capacity is 7.68 minutes.  Can proceed with rotator cuff surgery without further cardiac work-up. According to the Revised Cardiac Risk Index (RCRI), her Perioperative Risk of Major Cardiac Event is (%): 0.4  Her Functional Capacity in METs is: 7.68 according to the Duke Activity Status Index (DASI).    Long history of chest pain with normal cardiac cath in 2016, nuclear stress test 2018 without ischemia, normal LVEF.  No chest pain since last seen here.  Recommend 30 minutes of exercise daily.  Essential hypertension blood pressure well controlled on losartan, amlodipine and metoprolol  Dyslipidemia on low-dose Lipitor.  LDL 92, managed by PCP

## 2017-12-24 ENCOUNTER — Other Ambulatory Visit: Payer: Self-pay | Admitting: Specialist

## 2017-12-29 ENCOUNTER — Other Ambulatory Visit: Payer: Self-pay

## 2017-12-29 ENCOUNTER — Encounter
Admission: RE | Admit: 2017-12-29 | Discharge: 2017-12-29 | Disposition: A | Discharge status: Home / Self Care | Payer: 59 | Source: Ambulatory Visit | Attending: Specialist | Admitting: Specialist

## 2017-12-29 DIAGNOSIS — Z01818 Encounter for other preprocedural examination: Secondary | ICD-10-CM | POA: Insufficient documentation

## 2017-12-29 HISTORY — DX: Angina pectoris, unspecified: I20.9

## 2017-12-29 HISTORY — DX: Unspecified osteoarthritis, unspecified site: M19.90

## 2017-12-29 NOTE — Patient Instructions (Signed)
Your procedure is scheduled on: Wednesday, Jan 06, 2018  Report to THE SECOND FLOOR OF THE MEDICAL MALL. DO NOT STOP ON THE FIRST FLOOR TO REGISTER.  To find out your arrival time please call [phone] between 1PM - 3PM on Tuesday, Jan 05, 2018  Remember: Instructions that are not followed completely may result in serious  medical risk, up to and including death, or upon the discretion of your surgeon  and anesthesiologist your surgery may need to be rescheduled.     _X__ 1. Do not eat food after midnight the night before your procedure.                 No gum chewing or hard candies.                  You may drink clear liquids up to 2 hours                 before you are scheduled to arrive for your surgery-                   DO not drink clear liquids within 2 hours of the start of your surgery.                  Clear Liquids include:  water, apple juice without pulp, clear carbohydrate                 drink such as Clearfast of Gartorade, Black Coffee or Tea (Do not add                 anything to coffee or tea).  __X__2.  On the morning of surgery brush your teeth with toothpaste and water,                     You may rinse your mouth with mouthwash if you wish.                          Do not swallow any toothpaste of mouthwash.     _X__ 3.  No Alcohol for 24 hours before or after surgery.   _X__ 4.  Do Not Smoke or use e-cigarettes For 24 Hours Prior to Your Surgery.                 Do not use any chewable tobacco products for at least 6 hours prior to                 surgery.  ____  5.  Bring all medications with you on the day of surgery if instructed.   ____  6.  Notify your doctor if there is any change in your medical condition      (cold, fever, infections).     Do not wear jewelry, make-up, hairpins, clips or nail polish. Do not wear lotions, powders, or perfumes. You may wear deodorant. Do not shave 48 hours prior to surgery. Men may  shave face and neck. Do not bring valuables to the hospital.    [facility] is not responsible for any belongings or valuables.  Contacts, dentures or bridgework may not be worn into surgery. Leave your suitcase in the car. After surgery it may be brought to your room. For patients admitted to the hospital, discharge time is determined by your treatment team.   Patients discharged the day of surgery will not be allowed to drive  home.   Please read over the following fact sheets that you were given:                   PREPARING FOR SURGERY   ____ Take these medicines the morning of surgery with A SIP OF WATER:    1. BREO INHALER  2. METOPROLOL  3. AMLODIPINE  4. PRILOSEC  5. LIPITOR  6.  ____ Fleet Enema (as directed)   __X__ Use CHG Soap as directed  _X___ Use inhalers on the day of surgery  __X__ Stop metformin 2 days prior to surgery. STOP AS OF THE 26TH OF MAY    __X__ Stop ALL ASPIRIN PRODUCTS AS OF 12/30/2017  __X__ Stop Anti-inflammatories AS OF 12/30/2017             THIS INCLUDES IBUPROFEN / MOTRIN Amanda Lyons / ALEVE / MOBIC   _X___ Stop supplements until after surgery.                  YOU MAY CONTINUE VITAMIN B12 BUT DO NOT TAKE ON THE DAY OF SURGERY  ____ Bring C-Pap to the hospital.   DO NOT TAKE LOSARTAN ON THE DAY OF SURGERY BUT CONTINUE TAKING UP UNTIL THAT DAY.  NO CONTACTS  WEAR LARGE AND LOOSE FITTING FOR SURGERY

## 2017-12-30 NOTE — Pre-Procedure Instructions (Signed)
CLEARED BY CARDIOLOGY ON CHART 

## 2018-01-05 MED ORDER — CEFAZOLIN SODIUM-DEXTROSE 2-4 GM/100ML-% IV SOLN
2.0000 g | INTRAVENOUS | Status: AC
  Administered 2018-01-06: 2 g via INTRAVENOUS

## 2018-01-06 ENCOUNTER — Ambulatory Visit: Payer: 59 | Admitting: Anesthesiology

## 2018-01-06 ENCOUNTER — Other Ambulatory Visit: Payer: Self-pay

## 2018-01-06 ENCOUNTER — Ambulatory Visit
Admission: RE | Admit: 2018-01-06 | Discharge: 2018-01-06 | Disposition: A | Discharge status: Home / Self Care | Payer: 59 | Source: Ambulatory Visit | Attending: Specialist | Admitting: Specialist

## 2018-01-06 ENCOUNTER — Encounter
Admission: RE | Disposition: A | Discharge status: Home / Self Care | Payer: Self-pay | Source: Ambulatory Visit | Attending: Specialist

## 2018-01-06 DIAGNOSIS — E119 Type 2 diabetes mellitus without complications: Secondary | ICD-10-CM | POA: Diagnosis not present

## 2018-01-06 DIAGNOSIS — S43422A Sprain of left rotator cuff capsule, initial encounter: Secondary | ICD-10-CM | POA: Insufficient documentation

## 2018-01-06 DIAGNOSIS — F419 Anxiety disorder, unspecified: Secondary | ICD-10-CM | POA: Insufficient documentation

## 2018-01-06 DIAGNOSIS — Z79899 Other long term (current) drug therapy: Secondary | ICD-10-CM | POA: Insufficient documentation

## 2018-01-06 DIAGNOSIS — X500XXA Overexertion from strenuous movement or load, initial encounter: Secondary | ICD-10-CM | POA: Insufficient documentation

## 2018-01-06 DIAGNOSIS — Z7982 Long term (current) use of aspirin: Secondary | ICD-10-CM | POA: Diagnosis not present

## 2018-01-06 DIAGNOSIS — Y92242 Post office as the place of occurrence of the external cause: Secondary | ICD-10-CM | POA: Insufficient documentation

## 2018-01-06 DIAGNOSIS — I10 Essential (primary) hypertension: Secondary | ICD-10-CM | POA: Insufficient documentation

## 2018-01-06 DIAGNOSIS — Z7984 Long term (current) use of oral hypoglycemic drugs: Secondary | ICD-10-CM | POA: Insufficient documentation

## 2018-01-06 DIAGNOSIS — E785 Hyperlipidemia, unspecified: Secondary | ICD-10-CM | POA: Diagnosis not present

## 2018-01-06 DIAGNOSIS — K219 Gastro-esophageal reflux disease without esophagitis: Secondary | ICD-10-CM | POA: Insufficient documentation

## 2018-01-06 HISTORY — PX: SHOULDER ARTHROSCOPY WITH OPEN ROTATOR CUFF REPAIR: SHX6092

## 2018-01-06 LAB — GLUCOSE, CAPILLARY
GLUCOSE-CAPILLARY: 115 mg/dL — AB (ref 65–99)
Glucose-Capillary: 123 mg/dL — ABNORMAL HIGH (ref 65–99)

## 2018-01-06 SURGERY — ARTHROSCOPY, SHOULDER WITH REPAIR, ROTATOR CUFF, OPEN
Anesthesia: General | Site: Shoulder | Laterality: Left | Wound class: Clean

## 2018-01-06 MED ORDER — HYDROCODONE-ACETAMINOPHEN 5-325 MG PO TABS: 1.0000 | ORAL_TABLET | Freq: Four times a day (QID) | ORAL | 0 refills | Status: DC | PRN

## 2018-01-06 MED ORDER — LIDOCAINE HCL (PF) 1 % IJ SOLN
INTRAMUSCULAR | Status: AC
  Filled 2018-01-06: qty 5

## 2018-01-06 MED ORDER — MIDAZOLAM HCL 2 MG/2ML IJ SOLN
INTRAMUSCULAR | Status: AC
  Administered 2018-01-06: 1 mg via INTRAVENOUS
  Filled 2018-01-06: qty 2

## 2018-01-06 MED ORDER — SUGAMMADEX SODIUM 200 MG/2ML IV SOLN
INTRAVENOUS | Status: DC | PRN
  Administered 2018-01-06: 150 mg via INTRAVENOUS

## 2018-01-06 MED ORDER — MIDAZOLAM HCL 2 MG/2ML IJ SOLN
INTRAMUSCULAR | Status: DC | PRN
  Administered 2018-01-06: 2 mg via INTRAVENOUS

## 2018-01-06 MED ORDER — MELOXICAM 7.5 MG PO TABS
ORAL_TABLET | ORAL | Status: AC
  Administered 2018-01-06: 15 mg via ORAL
  Filled 2018-01-06: qty 2

## 2018-01-06 MED ORDER — PROPOFOL 10 MG/ML IV BOLUS
INTRAVENOUS | Status: AC
Start: 2018-01-06 — End: ?
  Filled 2018-01-06: qty 20

## 2018-01-06 MED ORDER — ROCURONIUM BROMIDE 50 MG/5ML IV SOLN
INTRAVENOUS | Status: AC
  Filled 2018-01-06: qty 1

## 2018-01-06 MED ORDER — CEFAZOLIN SODIUM-DEXTROSE 2-4 GM/100ML-% IV SOLN
INTRAVENOUS | Status: AC
  Filled 2018-01-06: qty 100

## 2018-01-06 MED ORDER — ROCURONIUM BROMIDE 50 MG/5ML IV SOLN
INTRAVENOUS | Status: AC
Start: 2018-01-06 — End: ?
  Filled 2018-01-06: qty 1

## 2018-01-06 MED ORDER — GABAPENTIN 300 MG PO CAPS
300.0000 mg | ORAL_CAPSULE | ORAL | Status: AC
  Administered 2018-01-06: 300 mg via ORAL

## 2018-01-06 MED ORDER — FENTANYL CITRATE (PF) 100 MCG/2ML IJ SOLN: 25.0000 ug | INTRAMUSCULAR | Status: DC | PRN

## 2018-01-06 MED ORDER — ROCURONIUM BROMIDE 100 MG/10ML IV SOLN
INTRAVENOUS | Status: DC | PRN
  Administered 2018-01-06: 10 mg via INTRAVENOUS
  Administered 2018-01-06: 20 mg via INTRAVENOUS
  Administered 2018-01-06: 40 mg via INTRAVENOUS
  Administered 2018-01-06: 10 mg via INTRAVENOUS

## 2018-01-06 MED ORDER — GABAPENTIN 300 MG PO CAPS
ORAL_CAPSULE | ORAL | Status: AC
  Administered 2018-01-06: 300 mg via ORAL
  Filled 2018-01-06: qty 1

## 2018-01-06 MED ORDER — MIDAZOLAM HCL 2 MG/2ML IJ SOLN
1.0000 mg | Freq: Once | INTRAMUSCULAR | Status: AC
  Administered 2018-01-06: 1 mg via INTRAVENOUS

## 2018-01-06 MED ORDER — PHENYLEPHRINE HCL 10 MG/ML IJ SOLN
INTRAMUSCULAR | Status: AC
  Filled 2018-01-06: qty 1

## 2018-01-06 MED ORDER — PHENYLEPHRINE HCL 10 MG/ML IJ SOLN
INTRAMUSCULAR | Status: DC | PRN
  Administered 2018-01-06 (×6): 100 ug via INTRAVENOUS

## 2018-01-06 MED ORDER — SUGAMMADEX SODIUM 200 MG/2ML IV SOLN
INTRAVENOUS | Status: AC
Start: 2018-01-06 — End: ?
  Filled 2018-01-06: qty 2

## 2018-01-06 MED ORDER — BUPIVACAINE LIPOSOME 1.3 % IJ SUSP
INTRAMUSCULAR | Status: DC | PRN
Start: 2018-01-06 — End: 2018-01-06
  Administered 2018-01-06: 7 mL via PERINEURAL
  Administered 2018-01-06: 13 mL via PERINEURAL

## 2018-01-06 MED ORDER — PROPOFOL 10 MG/ML IV BOLUS
INTRAVENOUS | Status: AC
  Filled 2018-01-06: qty 20

## 2018-01-06 MED ORDER — CLINDAMYCIN PHOSPHATE 600 MG/50ML IV SOLN
INTRAVENOUS | Status: AC
  Filled 2018-01-06: qty 50

## 2018-01-06 MED ORDER — BUPIVACAINE LIPOSOME 1.3 % IJ SUSP
INTRAMUSCULAR | Status: AC
  Filled 2018-01-06: qty 20

## 2018-01-06 MED ORDER — NEOMYCIN-POLYMYXIN B GU 40-200000 IR SOLN
Status: AC
  Filled 2018-01-06: qty 2

## 2018-01-06 MED ORDER — OXYCODONE HCL 5 MG/5ML PO SOLN: 5.0000 mg | Freq: Once | ORAL | Status: DC | PRN

## 2018-01-06 MED ORDER — CLINDAMYCIN PHOSPHATE 600 MG/50ML IV SOLN
600.0000 mg | INTRAVENOUS | Status: AC
  Administered 2018-01-06: 600 mg via INTRAVENOUS

## 2018-01-06 MED ORDER — GABAPENTIN 400 MG PO CAPS: 400.0000 mg | ORAL_CAPSULE | Freq: Two times a day (BID) | ORAL | 3 refills | Status: DC

## 2018-01-06 MED ORDER — BUPIVACAINE-EPINEPHRINE (PF) 0.25% -1:200000 IJ SOLN
INTRAMUSCULAR | Status: AC
  Filled 2018-01-06: qty 60

## 2018-01-06 MED ORDER — NEOMYCIN-POLYMYXIN B GU 40-200000 IR SOLN
Status: DC | PRN
  Administered 2018-01-06: 2 mL

## 2018-01-06 MED ORDER — OXYCODONE HCL 5 MG PO TABS: 5.0000 mg | ORAL_TABLET | Freq: Once | ORAL | Status: DC | PRN

## 2018-01-06 MED ORDER — BUPIVACAINE-EPINEPHRINE (PF) 0.25% -1:200000 IJ SOLN
INTRAMUSCULAR | Status: DC | PRN
  Administered 2018-01-06: 20 mL
  Administered 2018-01-06: 40 mL

## 2018-01-06 MED ORDER — BUPIVACAINE HCL (PF) 0.5 % IJ SOLN
INTRAMUSCULAR | Status: DC | PRN
  Administered 2018-01-06: 3 mL via PERINEURAL
  Administered 2018-01-06: 7 mL via PERINEURAL

## 2018-01-06 MED ORDER — ONDANSETRON HCL 4 MG/2ML IJ SOLN
INTRAMUSCULAR | Status: AC
  Filled 2018-01-06: qty 2

## 2018-01-06 MED ORDER — MELOXICAM 7.5 MG PO TABS
15.0000 mg | ORAL_TABLET | ORAL | Status: AC
  Administered 2018-01-06: 15 mg via ORAL

## 2018-01-06 MED ORDER — FENTANYL CITRATE (PF) 100 MCG/2ML IJ SOLN
50.0000 ug | Freq: Once | INTRAMUSCULAR | Status: AC
  Administered 2018-01-06: 50 ug via INTRAVENOUS

## 2018-01-06 MED ORDER — FENTANYL CITRATE (PF) 100 MCG/2ML IJ SOLN
INTRAMUSCULAR | Status: AC
  Filled 2018-01-06: qty 2

## 2018-01-06 MED ORDER — MORPHINE SULFATE (PF) 4 MG/ML IV SOLN
INTRAVENOUS | Status: DC | PRN
  Administered 2018-01-06: 4 mg

## 2018-01-06 MED ORDER — EPHEDRINE SULFATE 50 MG/ML IJ SOLN
INTRAMUSCULAR | Status: AC
  Filled 2018-01-06: qty 2

## 2018-01-06 MED ORDER — OXYCODONE HCL 5 MG PO TABS: 5.0000 mg | ORAL_TABLET | Freq: Three times a day (TID) | ORAL | 0 refills | Status: AC | PRN

## 2018-01-06 MED ORDER — SODIUM CHLORIDE 0.9 % IV SOLN
INTRAVENOUS | Status: DC | PRN
  Administered 2018-01-06: 50 ug/min via INTRAVENOUS

## 2018-01-06 MED ORDER — MORPHINE SULFATE (PF) 4 MG/ML IV SOLN
INTRAVENOUS | Status: AC
  Filled 2018-01-06: qty 1

## 2018-01-06 MED ORDER — FENTANYL CITRATE (PF) 100 MCG/2ML IJ SOLN
INTRAMUSCULAR | Status: DC | PRN
  Administered 2018-01-06: 100 ug via INTRAVENOUS

## 2018-01-06 MED ORDER — CHLORHEXIDINE GLUCONATE CLOTH 2 % EX PADS: 6.0000 | MEDICATED_PAD | Freq: Once | CUTANEOUS | Status: DC

## 2018-01-06 MED ORDER — SUCCINYLCHOLINE CHLORIDE 20 MG/ML IJ SOLN
INTRAMUSCULAR | Status: AC
  Filled 2018-01-06: qty 1

## 2018-01-06 MED ORDER — SODIUM CHLORIDE 0.9 % IV SOLN
INTRAVENOUS | Status: DC
  Administered 2018-01-06: 1000 mL via INTRAVENOUS

## 2018-01-06 MED ORDER — ONDANSETRON HCL 4 MG/2ML IJ SOLN
INTRAMUSCULAR | Status: DC | PRN
  Administered 2018-01-06: 4 mg via INTRAVENOUS

## 2018-01-06 MED ORDER — MIDAZOLAM HCL 2 MG/2ML IJ SOLN
INTRAMUSCULAR | Status: AC
  Filled 2018-01-06: qty 2

## 2018-01-06 MED ORDER — PROPOFOL 10 MG/ML IV BOLUS
INTRAVENOUS | Status: DC | PRN
  Administered 2018-01-06: 150 mg via INTRAVENOUS

## 2018-01-06 MED ORDER — LIDOCAINE HCL (PF) 2 % IJ SOLN
INTRAMUSCULAR | Status: AC
  Filled 2018-01-06: qty 10

## 2018-01-06 MED ORDER — BUPIVACAINE HCL (PF) 0.5 % IJ SOLN
INTRAMUSCULAR | Status: AC
  Filled 2018-01-06: qty 10

## 2018-01-06 MED ORDER — FENTANYL CITRATE (PF) 100 MCG/2ML IJ SOLN
INTRAMUSCULAR | Status: AC
  Administered 2018-01-06: 50 ug via INTRAVENOUS
  Filled 2018-01-06: qty 2

## 2018-01-06 MED ORDER — EPINEPHRINE 30 MG/30ML IJ SOLN
INTRAMUSCULAR | Status: AC
  Filled 2018-01-06: qty 1

## 2018-01-06 MED ORDER — EPINEPHRINE PF 1 MG/ML IJ SOLN
INTRAMUSCULAR | Status: DC | PRN
  Administered 2018-01-06: 20 mL

## 2018-01-06 SURGICAL SUPPLY — 54 items
ADAPTER IRRIG TUBE 2 SPIKE SOL (ADAPTER) ×2 IMPLANT
ANCHOR SUT 5.5 MULTIFIX (Orthopedic Implant) ×2 IMPLANT
BLADE AGGRESSIVE PLUS 4.0 (BLADE) ×2 IMPLANT
BUR AGGRESSIVE+ 5.5 (BURR) ×2 IMPLANT
BUR BR 5.5 12 FLUTE (BURR) ×4 IMPLANT
BUR RADIUS 4.0X18.5 (BURR) ×2 IMPLANT
BUR RADIUS 5.5 (BURR) IMPLANT
CANNULA 5.75X7 CRYSTAL CLEAR (CANNULA) IMPLANT
CANNULA 8.5X75 THRED (CANNULA) ×2 IMPLANT
CANNULA PARTIAL THREAD 2X7 (CANNULA) IMPLANT
CHLORAPREP W/TINT 26ML (MISCELLANEOUS) ×4 IMPLANT
CONNECTOR PERFECT PASSER (CONNECTOR) ×4 IMPLANT
COVER MAYO STAND STRL (DRAPES) ×2 IMPLANT
DRAPE IMP U-DRAPE 54X76 (DRAPES) ×4 IMPLANT
DRAPE SHEET LG 3/4 BI-LAMINATE (DRAPES) ×8 IMPLANT
DRAPE STERI 35X30 U-POUCH (DRAPES) ×2 IMPLANT
GAUZE PETRO XEROFOAM 1X8 (MISCELLANEOUS) ×2 IMPLANT
GAUZE SPONGE 4X4 12PLY STRL (GAUZE/BANDAGES/DRESSINGS) ×4 IMPLANT
GLOVE BIOGEL PI IND STRL 7.0 (GLOVE) ×6 IMPLANT
GLOVE BIOGEL PI INDICATOR 7.0 (GLOVE) ×6
GLOVE SURG ORTHO 8.0 STRL STRW (GLOVE) ×4 IMPLANT
GOWN STRL REUS W/TWL LRG LVL4 (GOWN DISPOSABLE) ×4 IMPLANT
IV LACTATED RINGER IRRG 3000ML (IV SOLUTION) ×20
IV LR IRRIG 3000ML ARTHROMATIC (IV SOLUTION) ×20 IMPLANT
KIT SHOULDER TRACTION (DRAPES) ×2 IMPLANT
KIT TURNOVER KIT A (KITS) ×2 IMPLANT
MANIFOLD NEPTUNE II (INSTRUMENTS) ×2 IMPLANT
MAT BLUE FLOOR 46X72 FLO (MISCELLANEOUS) ×4 IMPLANT
NDL SAFETY ECLIPSE 18X1.5 (NEEDLE) ×1 IMPLANT
NEEDLE HYPO 18GX1.5 SHARP (NEEDLE) ×1
NEEDLE SPNL 18GX3.5 QUINCKE PK (NEEDLE) ×2 IMPLANT
NS IRRIG 500ML POUR BTL (IV SOLUTION) ×2 IMPLANT
PACK ARTHROSCOPY SHOULDER (MISCELLANEOUS) ×2 IMPLANT
PASSER SUT CAPTURE FIRST (SUTURE) ×2 IMPLANT
SET TUBE SUCT SHAVER OUTFL 24K (TUBING) ×2 IMPLANT
SLING ULTRA II LG (MISCELLANEOUS) ×2 IMPLANT
SOL PREP PVP 2OZ (MISCELLANEOUS) ×2
SOLUTION PREP PVP 2OZ (MISCELLANEOUS) ×1 IMPLANT
SUT ETHILON 3 0 FSLX (SUTURE) ×2 IMPLANT
SUT PDS PLUS 0 (SUTURE) ×1
SUT PDS PLUS AB 0 CT-2 (SUTURE) ×1 IMPLANT
SUT PERFECTPASSER WHITE CART (SUTURE) ×2 IMPLANT
SUT SMART STITCH CARTRIDGE (SUTURE) ×2 IMPLANT
SUT VIC AB 2-0 CT2 27 (SUTURE) ×2 IMPLANT
SUT VICRYL 3-0 27IN (SUTURE) ×2 IMPLANT
SUTURE MAGNUM WIRE 2X48 BLK (SUTURE) ×4 IMPLANT
SYR 20CC LL (SYRINGE) ×2 IMPLANT
SYR 30ML LL (SYRINGE) ×2 IMPLANT
SYR 50ML LL SCALE MARK (SYRINGE) ×2 IMPLANT
TUBING ARTHRO INFLOW-ONLY STRL (TUBING) ×2 IMPLANT
TUBING CONNECTING 10 (TUBING) ×2 IMPLANT
WAND COBLATION FLOW 50 (SURGICAL WAND) IMPLANT
WAND HAND CNTRL MULTIVAC 90 (MISCELLANEOUS) ×4 IMPLANT
self adhering sterile post-op erative electrodes ×2 IMPLANT

## 2018-01-06 NOTE — Anesthesia Postprocedure Evaluation (Signed)
Anesthesia Post Note  Patient: Amanda Lyons  Procedure(s) Performed: SHOULDER ARTHROSCOPY WITH  ROTATOR CUFF REPAIR (Left Shoulder)  Patient location during evaluation: PACU Anesthesia Type: General Level of consciousness: awake and alert Pain management: pain level controlled Vital Signs Assessment: post-procedure vital signs reviewed and stable Respiratory status: spontaneous breathing, nonlabored ventilation, respiratory function stable and patient connected to nasal cannula oxygen Cardiovascular status: blood pressure returned to baseline and stable Postop Assessment: no apparent nausea or vomiting Anesthetic complications: no     Last Vitals:  Vitals:   01/06/18 1053 01/06/18 1105  BP: 114/72 116/67  Pulse: 81 80  Resp: 19 16  Temp:  36.6 C  SpO2: 92% 97%    Last Pain:  Vitals:   01/06/18 1105  TempSrc: Temporal  PainSc: 0-No pain                 Cleda Mccreedy Piscitello

## 2018-01-06 NOTE — H&P (Signed)
THE PATIENT WAS SEEN PRIOR TO SURGERY TODAY.  HISTORY, ALLERGIES, HOME MEDICATIONS AND OPERATIVE PROCEDURE WERE REVIEWED. RISKS AND BENEFITS OF SURGERY DISCUSSED WITH PATIENT AGAIN.  NO CHANGES FROM INITIAL HISTORY AND PHYSICAL NOTED.    

## 2018-01-06 NOTE — Anesthesia Post-op Follow-up Note (Signed)
Anesthesia QCDR form completed.        

## 2018-01-06 NOTE — Anesthesia Procedure Notes (Signed)
Procedure Name: Intubation Date/Time: 01/06/2018 7:49 AM Performed by: Hedda Slade, CRNA Pre-anesthesia Checklist: Patient identified, Patient being monitored, Timeout performed, Emergency Drugs available and Suction available Patient Re-evaluated:Patient Re-evaluated prior to induction Oxygen Delivery Method: Circle system utilized Preoxygenation: Pre-oxygenation with 100% oxygen Induction Type: IV induction Ventilation: Mask ventilation without difficulty Laryngoscope Size: Mac and 3 Grade View: Grade I Tube type: Oral Tube size: 7.0 mm Number of attempts: 1 Airway Equipment and Method: Stylet Placement Confirmation: ETT inserted through vocal cords under direct vision,  positive ETCO2 and breath sounds checked- equal and bilateral Secured at: 21 cm Tube secured with: Tape Dental Injury: Teeth and Oropharynx as per pre-operative assessment

## 2018-01-06 NOTE — Anesthesia Preprocedure Evaluation (Signed)
Anesthesia Evaluation  Patient identified by MRN, date of birth, ID band Patient awake    Reviewed: Allergy & Precautions, H&P , NPO status , Patient's Chart, lab work & pertinent test results  History of Anesthesia Complications Negative for: history of anesthetic complications  Airway Mallampati: III  TM Distance: <3 FB Neck ROM: limited    Dental  (+) Chipped, Poor Dentition   Pulmonary neg shortness of breath, asthma ,           Cardiovascular Exercise Tolerance: Good hypertension, + angina (-) Past MI + dysrhythmias Supra Ventricular Tachycardia      Neuro/Psych Anxiety  Neuromuscular disease negative psych ROS   GI/Hepatic Neg liver ROS, GERD  Medicated and Controlled,  Endo/Other  diabetes, Type 2  Renal/GU      Musculoskeletal  (+) Arthritis ,   Abdominal   Peds  Hematology negative hematology ROS (+)   Anesthesia Other Findings Past Medical History: 12/2016: Anginal pain (HCC)     Comment:  chest pain last time one year ago No date: Anxiety No date: Arthritis     Comment:  left shoulder No date: Asthma 11/08/2013: Chest pain No date: Diabetes mellitus without complication (HCC) 11/08/2013: Dyslipidemia No date: Dysrhythmia     Comment:  fast No date: GERD (gastroesophageal reflux disease) No date: History of anxiety     Comment:  had anxiety attacks No date: History of kidney stones 05/02/2013: HNP (herniated nucleus pulposus), lumbar No date: Hypertension 11/24/2017: Left rotator cuff tear No date: Trigeminal neuralgia  Past Surgical History: No date: ABDOMINAL HYSTERECTOMY No date: BACK SURGERY 2000,2001: BRAIN SURGERY     Comment:  for trigeminal neuralgia No date: CARDIAC CATHETERIZATION 1999: gamma knife     Comment:  for trigeminal neuralgia 10/11/2014: LEFT HEART CATHETERIZATION WITH CORONARY ANGIOGRAM; N/A     Comment:  Procedure: LEFT HEART CATHETERIZATION WITH CORONARY   ANGIOGRAM;  Surgeon: Lesleigh Noe, MD;  Location: [facility]              CATH LAB;  Service: Cardiovascular;  Laterality: N/A; 04/19/2003: LUMBAR LAMINECTOMY/DECOMPRESSION MICRODISCECTOMY     Comment:  Right L5-S1 intralaminar laminotomy for excision of               herniated disk with the operating microscope 05/02/2013: LUMBAR LAMINECTOMY/DECOMPRESSION MICRODISCECTOMY; Right     Comment:  Procedure: Right Lumbar Five Sacral One Microdiskectomy;              Surgeon: Temple Pacini, MD;  Location: [facility] NEURO ORS;                Service: Neurosurgery;  Laterality: Right;  LUMBAR               LAMINECTOMY/DECOMPRESSION MICRODISCECTOMY 1 LEVEL 05/28/2016: SHOULDER ARTHROSCOPY WITH OPEN ROTATOR CUFF REPAIR; Right     Comment:  Procedure: SHOULDER ARTHROSCOPY WITH OPEN ROTATOR CUFF               REPAIR;  Surgeon: Deeann Saint, MD;  Location: [facility] ORS;              Service: Orthopedics;  Laterality: Right;  BMI    Body Mass Index:  31.28 kg/m      Reproductive/Obstetrics negative OB ROS                             Anesthesia Physical Anesthesia Plan  ASA: III  Anesthesia Plan: General ETT  Post-op Pain Management: GA combined w/ Regional for post-op pain   Induction: Intravenous  PONV Risk Score and Plan: Ondansetron and Midazolam  Airway Management Planned: Oral ETT  Additional Equipment:   Intra-op Plan:   Post-operative Plan: Extubation in OR  Informed Consent: I have reviewed the patients History and Physical, chart, labs and discussed the procedure including the risks, benefits and alternatives for the proposed anesthesia with the patient or authorized representative who has indicated his/her understanding and acceptance.   Dental Advisory Given  Plan Discussed with: Anesthesiologist, CRNA and Surgeon  Anesthesia Plan Comments: (Patient consented for risks of anesthesia including but not limited to:  - adverse reactions to medications - damage to  teeth, lips or other oral mucosa - sore throat or hoarseness - Damage to heart, brain, lungs or loss of life  Patient voiced understanding.)        Anesthesia Quick Evaluation

## 2018-01-06 NOTE — Op Note (Signed)
01/06/2018  10:16 AM  PATIENT:  Amanda Lyons    PRE-OPERATIVE DIAGNOSIS:  O96.295M Sprain of left rotator cuff capsule, initial encounter  POST-OPERATIVE DIAGNOSIS:  Same  PROCEDURE:  1) SHOULDER ARTHROSCOPY WITH ARTHROSCOPIC LEFT ROTATOR CUFF REPAIR 2) ARTHROSCOPIC LEFT DISTAL CLAVICLE EXCISION 3) ARTHROSCOPIC LEFT SUBACROMIAL DECOMPRESSION 4) ARTHROSCOPIC LEFT BICEPS TENOTOMY  SURGEON:  Valinda Hoar, MD  ASST:  Altamese Cabal, PAC  ANESTHESIA:   General plus interscalene block  PREOPERATIVE INDICATIONS:  MACKENZEY CROWNOVER is a  62 y.o. female with a diagnosis of S43.422A Sprain of left rotator cuff capsule, initial encounter who failed conservative measures and elected for surgical management.    The risks benefits and alternatives were discussed with the patient preoperatively including but not limited to the risks of infection, bleeding, nerve injury, cardiopulmonary complications, the need for revision surgery, among others, and the patient was willing to proceed.  OPERATIVE IMPLANTS: 1 MULTIFIX ANCHOR  OPERATIVE FINDINGS: The patient had severe subacromial bursitis and impingement. The anterior acromion was prominent. The ACjoint was arthritic. The glenohumeral joint was intact.  There was mild fraying of the biceps tendon proximally.  There was mild fraying of the labrum.  There was a large rotator cuff tear anteriorly measuring about 0.5 cm.  There is minimal retraction.  OPERATIVE PROCEDURE: The patient was brought to the operating room where satisfactory general endotracheal and interscalene anesthesia were accomplished.The patient was turned into the lateral decubitus position and the shoulder was prepped and draped in a sterile fashion. Arthroscopy was carried out from a posterior portal with accessory portals laterally and anteriorly. The  joint was examined first. The above findings were encountered. The motorized shaver was introduced anteriorly and the undersurface of the  rotator cuff probed and lightly debrided. The biceps tendon was frayed proximally..  The ArthroCare wand was introduced and the biceps tendon was released completely. The labrum was trimmed up with the ArthroCare wand as well. The arthroscope was redirected into the subacromial space. There was severe bursitis which was resected with the motorized shaver and ArthroCare wand. The large bur was introduced from a posterior portal and the anterior acromion was debrided. The undersurface of the clavicle was debrided with the bur which was then reintroduced from an anterior portal and the remaining distal clavicle completely excised.  The tendinous attachment laterally was cleaned up with a arc repair wand to remove all soft tissue.  The bur was then used to lightly abrade the bone on the surface.  The edge of the cuff had been completely trimmed.  2 Magnum wire sutures were inserted into the cuff anteriorly and posteriorly.  A pilot hole was made laterally for the anchor.  The sutures were brought out through the lateral portal and passed through the multi fix anchor.  This was then advanced down into the pilot hole pulling tension on the sutures.  This brought the cuff over nicely to completely cover the tuberosity.  The anchor was completely deployed and the sutures were cut short.  Excellent repair and coverage of this was obtained this way.  Final debridement was carried out with the motorized shaver and the ArthroCare wand. The joint was flushed and the stab wounds and closed with 3-0 nylon suture. 1/4% Marcaine with morphine was injected into the shoulder. Sponge and needle counts were correct.   The dry sterile dressing and was applied along with a TENS unit and padded sling. Patient was awakened and taken recovery in good condition.   Dimas Aguas  V[place] Agar.D.

## 2018-01-06 NOTE — Discharge Instructions (Signed)
AMBULATORY SURGERY  DISCHARGE INSTRUCTIONS   1) The drugs that you were given will stay in your system until tomorrow so for the next 24 hours you should not:  A) Drive an automobile B) Make any legal decisions C) Drink any alcoholic beverage   2) You may resume regular meals tomorrow.  Today it is better to start with liquids and gradually work up to solid foods.  You may eat anything you prefer, but it is better to start with liquids, then soup and crackers, and gradually work up to solid foods.   3) Please notify your doctor immediately if you have any unusual bleeding, trouble breathing, redness and pain at the surgery site, drainage, fever, or pain not relieved by medication.    4) Additional Instructions:   Please contact your physician with any problems or Same Day Surgery at [phone], Monday through Friday 6 am to 4 pm, or [facility] number at [phone].     Interscalene Nerve Block with Exparel  1.  For your surgery you have received an Interscalene Nerve Block with Exparel. 2. Nerve Blocks affect many types of nerves, including nerves that control movement, pain and normal sensation.  You may experience feelings such as numbness, tingling, heaviness, weakness or the inability to move your arm or the feeling or sensation that your arm has "fallen asleep". 3. A nerve block with Exparel can last up to 5 days.  Usually the weakness wears off first.  The tingling and heaviness usually wear off next.  Finally you may start to notice pain.  Keep in mind that this may occur in any order.  Once a nerve block starts to wear off it is usually completely gone within 60 minutes. 4. ISNB may cause mild shortness of breath, a hoarse voice, blurry vision, unequal pupils, or drooping of the face on the same side as the nerve block.  These symptoms will usually resolve with the numbness.  Very rarely the procedure itself can cause mild seizures. 5. If needed, your  surgeon will give you a prescription for pain medication.  It will take about 60 minutes for the oral pain medication to become fully effective.  So, it is recommended that you start taking this medication before the nerve block first begins to wear off, or when you first begin to feel discomfort. 6. Take your pain medication only as prescribed.  Pain medication can cause sedation and decrease your breathing if you take more than you need for the level of pain that you have. 7. Nausea is a common side effect of many pain medications.  You may want to eat something before taking your pain medicine to prevent nausea. 8. After an Interscalene nerve block, you cannot feel pain, pressure or extremes in temperature in the effected arm.  Because your arm is numb it is at an increased risk for injury.  To decrease the possibility of injury, please practice the following:  a. While you are awake change the position of your arm frequently to prevent too much pressure on any one area for prolonged periods of time. b.  If you have a cast or tight dressing, check the color or your fingers every couple of hours.  Call your surgeon with the appearance of any discoloration (white or blue). c. If you are given a sling to wear before you go home, please wear it  at all times until the block has completely worn off.  Do not get up at   night without your sling. d. Please contact [facility] or your surgeon if you do not begin to regain sensation after 7 days from the surgery.  Anesthesia may be contacted by calling the Same Day Surgery Department, Mon. through Fri., 6 am to 4 pm at [phone].   e. If you experience any other problems or concerns, please contact your surgeon's office. f. If you experience severe or prolonged shortness of breath go to the nearest emergency department. 

## 2018-01-06 NOTE — Transfer of Care (Signed)
Immediate Anesthesia Transfer of Care Note  Patient: Amanda Lyons  Procedure(s) Performed: SHOULDER ARTHROSCOPY WITH  ROTATOR CUFF REPAIR (Left Shoulder)  Patient Location: PACU  Anesthesia Type:General  Level of Consciousness: sedated  Airway & Oxygen Therapy: Patient Spontanous Breathing and Patient connected to face mask oxygen  Post-op Assessment: Report given to RN and Post -op Vital signs reviewed and stable  Post vital signs: Reviewed and stable  Last Vitals:  Vitals Value Taken Time  BP 115/74 01/06/2018 10:23 AM  Temp 36.4 C 01/06/2018 10:23 AM  Pulse 81 01/06/2018 10:23 AM  Resp 14 01/06/2018 10:23 AM  SpO2 98 % 01/06/2018 10:23 AM    Last Pain:  Vitals:   01/06/18 1023  TempSrc:   PainSc: 0-No pain         Complications: No apparent anesthesia complications

## 2018-01-06 NOTE — Anesthesia Procedure Notes (Signed)
Anesthesia Regional Block: Interscalene brachial plexus block   Pre-Anesthetic Checklist: ,, timeout performed, Correct Patient, Correct Site, Correct Laterality, Correct Procedure, Correct Position, site marked, Risks and benefits discussed,  Surgical consent,  Pre-op evaluation,  At surgeon's request and post-op pain management  Laterality: Upper and Left  Prep: chloraprep       Needles:  Injection technique: Single-shot  Needle Type: Stimiplex     Needle Length: 5cm  Needle Gauge: 22     Additional Needles:   Procedures:,,,, ultrasound used (permanent image in chart),,,,  Narrative:  Start time: 01/06/2018 7:30 AM End time: 01/06/2018 7:32 AM Injection made incrementally with aspirations every 5 mL.  Performed by: Personally  Anesthesiologist: Piscitello, Cleda Mccreedy, MD  Additional Notes: Functioning IV was confirmed and monitors were applied.  A 50mm 22ga Stimuplex needle was used. Sterile prep,hand hygiene and sterile gloves were used.  Minimal sedation used for procedure.  No paresthesia endorsed by patient during the procedure.  Negative aspiration and negative test dose prior to incremental administration of local anesthetic. The patient tolerated the procedure well with no immediate complications.

## 2018-01-07 ENCOUNTER — Encounter: Payer: Self-pay | Admitting: Specialist

## 2018-01-21 ENCOUNTER — Ambulatory Visit: Payer: 59 | Admitting: Family Medicine

## 2018-02-10 ENCOUNTER — Other Ambulatory Visit: Payer: Self-pay | Admitting: Family Medicine

## 2018-03-30 ENCOUNTER — Encounter: Payer: Self-pay | Admitting: Family Medicine

## 2018-03-30 ENCOUNTER — Ambulatory Visit: Payer: 59 | Admitting: Family Medicine

## 2018-03-30 ENCOUNTER — Telehealth: Payer: Self-pay | Admitting: Cardiology

## 2018-03-30 VITALS — BP 132/84 | HR 75 | Temp 98.3°F | Ht 62.0 in | Wt 168.5 lb

## 2018-03-30 DIAGNOSIS — E538 Deficiency of other specified B group vitamins: Secondary | ICD-10-CM

## 2018-03-30 DIAGNOSIS — I519 Heart disease, unspecified: Secondary | ICD-10-CM

## 2018-03-30 DIAGNOSIS — E1100 Type 2 diabetes mellitus with hyperosmolarity without nonketotic hyperglycemic-hyperosmolar coma (NKHHC): Secondary | ICD-10-CM | POA: Diagnosis not present

## 2018-03-30 DIAGNOSIS — I5189 Other ill-defined heart diseases: Secondary | ICD-10-CM | POA: Insufficient documentation

## 2018-03-30 DIAGNOSIS — E669 Obesity, unspecified: Secondary | ICD-10-CM

## 2018-03-30 DIAGNOSIS — R600 Localized edema: Secondary | ICD-10-CM | POA: Diagnosis not present

## 2018-03-30 DIAGNOSIS — K760 Fatty (change of) liver, not elsewhere classified: Secondary | ICD-10-CM | POA: Diagnosis not present

## 2018-03-30 DIAGNOSIS — E559 Vitamin D deficiency, unspecified: Secondary | ICD-10-CM

## 2018-03-30 DIAGNOSIS — R5383 Other fatigue: Secondary | ICD-10-CM

## 2018-03-30 MED ORDER — HYDROCHLOROTHIAZIDE 25 MG PO TABS: 25.0000 mg | ORAL_TABLET | Freq: Every day | ORAL | 1 refills | Status: DC

## 2018-03-30 MED ORDER — AMLODIPINE BESYLATE 2.5 MG PO TABS: 2.5000 mg | ORAL_TABLET | Freq: Every day | ORAL | 1 refills | Status: DC

## 2018-03-30 NOTE — Telephone Encounter (Signed)
Decrease amlodipine to 2.5 mg po daily and add HCTZ 25 mg po daily, have her call us in 2 weeks if there is any change and what her BP is

## 2018-03-30 NOTE — Assessment & Plan Note (Signed)
Foot exam done by MD; watch toenails

## 2018-03-30 NOTE — Telephone Encounter (Signed)
Pt is calling to report to Dr Delton SeeNelson that over the last 3 weeks she has noticed some mild lower extremity edema.  Pt states she does work on her feet 12 hours a day.  Pt states she went in to see her PCP for this, and her PCP thinks that its her amlodipine causing her LEE, and advised her to call our office and request for Dr Delton SeeNelson to advise on a different regimen.  Pt has no other cardiac complaints at this time.  Pt does wear stockings while at work.  Pt states her BP at her PCP visit yesterday was 132/80. Informed the pt that Dr Delton SeeNelson is out of the office today, but I will route this message to her for further review and recommendation, and follow-up with the pt shortly thereafter. Pt verbalized understanding and agrees with this plan.

## 2018-03-30 NOTE — Progress Notes (Signed)
BP 132/84   Pulse 75   Temp 98.3 F (36.8 C)   Ht _0  (1.575 m)   Wt 168 lb 8 oz (76.4 kg)   SpO2 96%   BMI 30.82 kg/m    Subjective:    Patient ID: Amanda Lyons, female    DOB: August 25, 1955, 62 y.o.   MRN: [id number]  HPI: Amanda Lyons is a 62 y.o. female  Chief Complaint  Patient presents with  . Foot Swelling    Biltaeral, swelling going up left leg. Onset 2 weeks ago.     HPI Patient is here for an acute visit She has swelling in her legs Started two weeks ago Both legs Does not want to wear compression stockings in the summer with shorts; too hot where she works Cutting back on salt; uses some, but trying to cut back No weight gain Has a cardiologist; no hx of CHF; she has been in the recliner lately because of shoulder surgery, but not orthopneic; no DOE with flight of stairs Last echo Feb 2018; LVEF 29-47%; grade 1 diastolic dysfunction On amlodipine, started by heart doctor; she has been on this for a while Lots of spider veins in both legs; no big varicose veins No protein in the urine last check Urinating well; no foamy urine Mildly elev alk phos, fatty liver on [place] Diabetes Lab Results  Component Value Date   HGBA1C 6.4 (H) 11/24/2017  obesity; did lose a few pounds Tired and no energy Taking B12 and quit vit D; no hx of anemia; no blood in the stools  Depression screen [facility] 2/9 03/30/2018 11/24/2017 07/23/2017 06/04/2017 10/01/2016  Decreased Interest 0 0 0 0 0  Down, Depressed, Hopeless 0 0 0 0 0  PHQ - 2 Score 0 0 0 0 0    Relevant past medical, surgical, family and social history reviewed Past Medical History:  Diagnosis Date  . Anginal pain ([place]) 12/2016   chest pain last time one year ago  . Anxiety   . Arthritis    left shoulder  . Asthma   . Chest pain 11/08/2013  . Diabetes mellitus without complication ([place])   . Dyslipidemia 11/08/2013  . Dysrhythmia    fast  . GERD (gastroesophageal reflux disease)   . History of anxiety    had  anxiety attacks  . History of kidney stones   . HNP (herniated nucleus pulposus), lumbar 05/02/2013  . Hypertension   . Left rotator cuff tear 11/24/2017  . Trigeminal neuralgia    Past Surgical History:  Procedure Laterality Date  . ABDOMINAL HYSTERECTOMY    . BACK SURGERY    . BRAIN SURGERY  2000,2001   for trigeminal neuralgia  . CARDIAC CATHETERIZATION    . gamma knife  1999   for trigeminal neuralgia  . LEFT HEART CATHETERIZATION WITH CORONARY ANGIOGRAM N/A 10/11/2014   Procedure: LEFT HEART CATHETERIZATION WITH CORONARY ANGIOGRAM;  Surgeon: Sinclair Grooms, MD;  Location: [facility] CATH LAB;  Service: Cardiovascular;  Laterality: N/A;  . LUMBAR LAMINECTOMY/DECOMPRESSION MICRODISCECTOMY  04/19/2003   Right L5-S1 intralaminar laminotomy for excision of herniated disk with the operating microscope  . LUMBAR LAMINECTOMY/DECOMPRESSION MICRODISCECTOMY Right 05/02/2013   Procedure: Right Lumbar Five Sacral One Microdiskectomy;  Surgeon: Charlie Pitter, MD;  Location: [place] NEURO ORS;  Service: Neurosurgery;  Laterality: Right;  LUMBAR LAMINECTOMY/DECOMPRESSION MICRODISCECTOMY 1 LEVEL  . SHOULDER ARTHROSCOPY WITH OPEN ROTATOR CUFF REPAIR Right 05/28/2016   Procedure: SHOULDER ARTHROSCOPY WITH OPEN ROTATOR CUFF  REPAIR;  Surgeon: Earnestine Leys, MD;  Location: [facility] ORS;  Service: Orthopedics;  Laterality: Right;  . SHOULDER ARTHROSCOPY WITH OPEN ROTATOR CUFF REPAIR Left 01/06/2018   Procedure: SHOULDER ARTHROSCOPY WITH  ROTATOR CUFF REPAIR;  Surgeon: Earnestine Leys, MD;  Location: [facility] ORS;  Service: Orthopedics;  Laterality: Left;   Family History  Problem Relation Age of Onset  . CVA Father   . Heart failure Father   . Heart disease Father   . Diabetes Father   . Hypertension Father   . Ovarian cancer Mother        early 45's  . Cancer Mother   . Heart failure Mother   . Heart failure Maternal Grandmother   . Heart attack Maternal Grandfather   . Diabetes Sister   . Hypertension Sister   .  Hypertension Sister    Social History   Tobacco Use  . Smoking status: Never Smoker  . Smokeless tobacco: Never Used  Substance Use Topics  . Alcohol use: No  . Drug use: No    Interim medical history since last visit reviewed. Allergies and medications reviewed  Review of Systems Per HPI unless specifically indicated above     Objective:    BP 132/84   Pulse 75   Temp 98.3 F (36.8 C)   Ht _0  (1.575 m)   Wt 168 lb 8 oz (76.4 kg)   SpO2 96%   BMI 30.82 kg/m   Wt Readings from Last 3 Encounters:  03/30/18 168 lb 8 oz (76.4 kg)  01/06/18 171 lb (77.6 kg)  12/29/17 170 lb (77.1 kg)    Physical Exam  Constitutional: She appears well-developed and well-nourished.  HENT:  Mouth/Throat: Mucous membranes are normal.  Eyes: EOM are normal. No scleral icterus.  Cardiovascular: Normal rate and regular rhythm.  Pulmonary/Chest: Effort normal and breath sounds normal. No accessory muscle usage. No tachypnea. She has no rhonchi. She has no rales.  Musculoskeletal: She exhibits edema (pitting edema lower half of legs).  No calf tenderness, unable to elicit homan's B  Psychiatric: She has a normal mood and affect. Her behavior is normal. Her mood appears not anxious. She does not exhibit a depressed mood.    Results for orders placed or performed during the hospital encounter of 01/06/18  Glucose, capillary  Result Value Ref Range   Glucose-Capillary 123 (H) 65 - 99 mg/dL  Glucose, capillary  Result Value Ref Range   Glucose-Capillary 115 (H) 65 - 99 mg/dL      Assessment & Plan:   Problem List Items Addressed This Visit      Digestive   Fatty liver    Work on weight loss; 10 pounds over the next 3-4 months      Relevant Orders   COMPLETE METABOLIC PANEL WITH GFR     Endocrine   Type 2 diabetes mellitus ([place])    Foot exam done by MD; watch toenails        Other   Vitamin D deficiency   Relevant Orders   VITAMIN D 25 Hydroxy (Vit-D Deficiency, Fractures)    Bilateral leg edema - Primary   Relevant Orders   B Nat Peptide   Obesity (BMI 30.0-34.9)    Encouraged weight loss; 10 pounds over 3-4 months      Grade I diastolic dysfunction    Patient will contact heart doctor; consider beta-blocker (defer to cardiologist)       Other Visit Diagnoses    Vitamin B 12 deficiency  Relevant Orders   Vitamin B12   Other fatigue       Relevant Orders   TSH   CBC with Differential/Platelet       Follow up plan: No follow-ups on file.  An after-visit summary was printed and given to the patient at [place].  Please see the patient instructions which may contain other information and recommendations beyond what is mentioned above in the assessment and plan.  No orders of the defined types were placed in this encounter.   Orders Placed This Encounter  Procedures  . B Nat Peptide  . COMPLETE METABOLIC PANEL WITH GFR  . VITAMIN D 25 Hydroxy (Vit-D Deficiency, Fractures)  . TSH  . CBC with Differential/Platelet  . Vitamin B12

## 2018-03-30 NOTE — Assessment & Plan Note (Signed)
Patient will contact heart doctor; consider beta-blocker (defer to cardiologist)

## 2018-03-30 NOTE — Telephone Encounter (Signed)
New Message:    Pt c/o medication issue:  1. Name of Medication:amLODipine (NORVASC) 10 MG tablet  2. How are you currently taking this medication (dosage and times per day)?  Take 1 tablet (10 mg total) by mouth daily.  3. Are you having a reaction (difficulty breathing--STAT)? No   4. What is your medication issue? Pt states PCP believe the cause of swelling in feet

## 2018-03-30 NOTE — Telephone Encounter (Signed)
Spoke with the pt and informed her that per Dr Delton SeeNelson, she recommends that we decrease her amlodipine to 2.5 mg po daily, add HCTZ 25 mg po daily, and call us back in 2 weeks to report any change, and to report what her BP is.  Educated the pt on how to correctly take her BP daily and log this information.  Confirmed the pharmacy of choice with the pt.  Pt verbalized understanding and agrees with this plan.

## 2018-03-30 NOTE — Assessment & Plan Note (Signed)
Encouraged weight loss; 10 pounds over 3-4 months

## 2018-03-30 NOTE — Patient Instructions (Addendum)
Cut out the salt shaker completely Please call your heart doctor about the leg edema and ask if they can change the amlodipine to something else in case that is causing the swelling Wear compression stockings from morning to night Elevate legs occasionally if not wearing stockings  Walk regularly, build up gradually Let's get labs If you have not heard anything from my staff in a week about any orders/referrals/studies from today, please contact us here to follow-up [phone] Call with an update in 2-3 weeks

## 2018-03-30 NOTE — Assessment & Plan Note (Signed)
Work on weight loss; 10 pounds over the next 3-4 months

## 2018-03-31 LAB — COMPLETE METABOLIC PANEL WITH GFR
AG Ratio: 1.6 (calc) (ref 1.0–2.5)
ALBUMIN MSPROF: 4.5 g/dL (ref 3.6–5.1)
ALT: 38 U/L — ABNORMAL HIGH (ref 6–29)
AST: 23 U/L (ref 10–35)
Alkaline phosphatase (APISO): 139 U/L — ABNORMAL HIGH (ref 33–130)
BUN: 23 mg/dL (ref 7–25)
CO2: 25 mmol/L (ref 20–32)
Calcium: 10 mg/dL (ref 8.6–10.4)
Chloride: 103 mmol/L (ref 98–110)
Creat: 0.62 mg/dL (ref 0.50–0.99)
GFR, EST AFRICAN AMERICAN: 114 mL/min/{1.73_m2} (ref >=60)
GFR, EST NON AFRICAN AMERICAN: 98 mL/min/{1.73_m2} (ref >=60)
GLOBULIN: 2.8 g/dL (ref 1.9–3.7)
GLUCOSE: 106 mg/dL (ref 65–139)
Potassium: 4 mmol/L (ref 3.5–5.3)
SODIUM: 139 mmol/L (ref 135–146)
Total Bilirubin: 0.3 mg/dL (ref 0.2–1.2)
Total Protein: 7.3 g/dL (ref 6.1–8.1)

## 2018-03-31 LAB — CBC WITH DIFFERENTIAL/PLATELET
BASOS ABS: 40 {cells}/uL (ref 0–200)
Basophils Relative: 0.6 %
EOS PCT: 2.7 %
Eosinophils Absolute: 181 cells/uL (ref 15–500)
HEMATOCRIT: 40.9 % (ref 35.0–45.0)
HEMOGLOBIN: 13.7 g/dL (ref 11.7–15.5)
Lymphs Abs: 1541 cells/uL (ref 850–3900)
MCH: 28.7 pg (ref 27.0–33.0)
MCHC: 33.5 g/dL (ref 32.0–36.0)
MCV: 85.6 fL (ref 80.0–100.0)
MPV: 9.4 fL (ref 7.5–12.5)
Monocytes Relative: 8.7 %
NEUTROS ABS: 4355 {cells}/uL (ref 1500–7800)
Neutrophils Relative %: 65 %
Platelets: 339 10*3/uL (ref 140–400)
RBC: 4.78 10*6/uL (ref 3.80–5.10)
RDW: 12.7 % (ref 11.0–15.0)
Total Lymphocyte: 23 %
WBC mixed population: 583 cells/uL (ref 200–950)
WBC: 6.7 10*3/uL (ref 3.8–10.8)

## 2018-03-31 LAB — VITAMIN D 25 HYDROXY (VIT D DEFICIENCY, FRACTURES): Vit D, 25-Hydroxy: 29 ng/mL — ABNORMAL LOW (ref 30–100)

## 2018-03-31 LAB — TSH: TSH: 1.12 m[IU]/L (ref 0.40–4.50)

## 2018-03-31 LAB — VITAMIN B12: Vitamin B-12: 924 pg/mL (ref 200–1100)

## 2018-03-31 LAB — BRAIN NATRIURETIC PEPTIDE: Brain Natriuretic Peptide: 30 pg/mL (ref <100)

## 2018-04-10 ENCOUNTER — Other Ambulatory Visit: Payer: Self-pay | Admitting: Family Medicine

## 2018-04-13 NOTE — Telephone Encounter (Signed)
Last OV: 03/30/18

## 2018-07-07 ENCOUNTER — Telehealth: Payer: Self-pay | Admitting: Nurse Practitioner

## 2018-07-07 NOTE — Telephone Encounter (Signed)
Patient is due for a diabetes follow-up and labs Last A1c was in April Please schedule appt We'll see her soon I sent Rxs as requested

## 2018-07-09 NOTE — Telephone Encounter (Signed)
Appt scheduled for 12.18.19

## 2018-07-28 ENCOUNTER — Encounter: Payer: Self-pay | Admitting: Family Medicine

## 2018-07-28 ENCOUNTER — Ambulatory Visit: Payer: 59 | Admitting: Family Medicine

## 2018-07-28 DIAGNOSIS — I10 Essential (primary) hypertension: Secondary | ICD-10-CM | POA: Diagnosis not present

## 2018-07-28 DIAGNOSIS — E1100 Type 2 diabetes mellitus with hyperosmolarity without nonketotic hyperglycemic-hyperosmolar coma (NKHHC): Secondary | ICD-10-CM | POA: Diagnosis not present

## 2018-07-28 DIAGNOSIS — Z5181 Encounter for therapeutic drug level monitoring: Secondary | ICD-10-CM

## 2018-07-28 DIAGNOSIS — E785 Hyperlipidemia, unspecified: Secondary | ICD-10-CM | POA: Diagnosis not present

## 2018-07-28 DIAGNOSIS — E669 Obesity, unspecified: Secondary | ICD-10-CM

## 2018-07-28 DIAGNOSIS — R748 Abnormal levels of other serum enzymes: Secondary | ICD-10-CM

## 2018-07-28 DIAGNOSIS — E559 Vitamin D deficiency, unspecified: Secondary | ICD-10-CM | POA: Diagnosis not present

## 2018-07-28 DIAGNOSIS — E66811 Obesity, class 1: Secondary | ICD-10-CM

## 2018-07-28 NOTE — Patient Instructions (Addendum)
Check out the information at familydoctor.org entitled "Nutrition for Weight Loss: What You Need to Know about Fad Diets" Try to lose between 1-2 pounds per week by taking in fewer calories and burning off more calories You can succeed by limiting portions, limiting foods dense in calories and fat, becoming more active, and drinking 8 glasses of water a day (64 ounces) Don't skip meals, especially breakfast, as skipping meals may alter your metabolism Do not use over-the-counter weight loss pills or gimmicks that claim rapid weight loss A healthy BMI (or body mass index) is between 18.5 and 24.9 You can calculate your ideal BMI at the NIH website JobEconomics.hu Try to limit saturated fats in your diet (bologna, hot dogs, barbeque, cheeseburgers, hamburgers, steak, bacon, sausage, cheese, etc.) and get more fresh fruits, vegetables, and whole grains Please do see your eye doctor regularly, and have your eyes examined every year (or more often per his or her recommendation) Check your feet every night and let me know right away of any sores, infections, numbness, etc. Try to limit sweets, white bread, white rice, white potatoes It is okay with me for you to not check your fingerstick blood sugars unless you are interested and feel it would be helpful for you  Preventing Health Risks of Being Overweight Maintaining a healthy body weight is an important part of your overall health. Your healthy body weight depends on your age, gender, and height. Being overweight puts you at risk for many health problems, including:  Heart disease.  Diabetes.  Problems sleeping.  Joint problems. You can make changes to your diet and lifestyle to prevent these risks. Consider working with a health care provider or a dietitian to make these changes. What nutrition changes can be made?   Eat only as much as your body needs. In most cases, this is about 2,000  calories a day, but the amount varies depending on your height, gender, and activity level. Ask your health care provider how many calories you should have each day. Eating more than your body needs on a regular basis can cause you to become overweight or obese.  Eat slowly, and stop eating when you feel full.  Choose healthy foods, including: ? Fruits and vegetables. ? Lean meats. ? Low-fat dairy products. ? High-fiber foods, such as whole grains and beans. ? Healthy snacks like vegetable sticks, a piece of fruit, or a small amount of yogurt or cheese.  Avoid foods and drinks that are high in sugar, salt (sodium), saturated fat, or trans fat. This includes: ? Many desserts such as candy, cookies, and ice cream. ? Soda. ? Fried foods. ? Processed meats such as hot dogs or lunch meats. ? Prepackaged snack foods. What lifestyle changes can be made?   Exercise for at least 150 minutes a week to prevent weight gain, or as often as recommended by your health care provider. Do moderate-intensity exercise, such as brisk walking. ? Spread it out by exercising for 30 minutes 5 days a week, or in short 10-minute bursts several times a day.  Find other ways to stay active and burn calories, such as yard work or a hobby that involves physical activity.  Get at least 8 hours of sleep each night. When you are well-rested, you are more likely to be active and make healthy choices during the day. To sleep better: ? Try to go to bed and wake up at about the same time every day. ? Keep your bedroom dark, quiet, and  cool. ? Make sure that your bed is comfortable. ? Avoid stimulating activities, such as watching television or exercising, for at least one hour before bedtime. Why are these changes important? Eating healthy and being active helps you lose weight and prevent health problems caused by being overweight. Making these changes can also help you manage stress, feel better mentally, and connect  with friends and family. What can happen if changes are not made? Being overweight can affect you for your entire life. You may develop joint or bone problems that make it painful or difficult for you to play sports or do activities you enjoy. Being overweight puts stress on your heart and lungs and can lead to medical problems like diabetes, heart disease, and sleeping problems. Where to find support You can get support for preventing health risks of being overweight from:  Your health care provider or a dietitian. They can provide guidance about healthy eating and healthy lifestyle choices.  Weight loss support groups, online or in-person. Where to find more information  MyPlate: https://ball-collins.biz/ ? This an online tool that provides personalized recommendations about foods to eat each day.  The Centers for Disease Control and Prevention: AffordableScrapbook.gl ? This resource gives tips for managing weight and having an active lifestyle. Summary  To prevent unhealthy weight gain, it is important to maintain a healthy diet high in vegetables and whole grains, exercise regularly, and get at least 8 hours of sleep each night.  Making these changes helps prevent many long-term (chronic) health conditions that can shorten your life, such as diabetes, heart disease, and stroke. This information is not intended to replace advice given to you by your health care provider. Make sure you discuss any questions you have with your health care provider. Document Released: 06/24/2017 Document Revised: 06/24/2017 Document Reviewed: 06/24/2017 Elsevier Interactive Patient Education  2019 Elsevier Inc.  Preventing Unhealthy [employer], Adult Staying at a healthy weight is important to your overall health. When fat builds up in your body, you may become overweight or obese. Being overweight or obese increases your risk of developing certain health problems, such as heart disease, diabetes, sleeping  problems, joint problems, and some types of cancer. Unhealthy weight gain is often the result of making unhealthy food choices or not getting enough exercise. You can make changes to your lifestyle to prevent obesity and stay as healthy as possible. What nutrition changes can be made?   Eat only as much as your body needs. To do this: ? Pay attention to signs that you are hungry or full. Stop eating as soon as you feel full. ? If you feel hungry, try drinking water first before eating. Drink enough water so your urine is clear or pale yellow. ? Eat smaller portions. Pay attention to portion sizes when eating out. ? Look at serving sizes on food labels. Most foods contain more than one serving per container. ? Eat the recommended number of calories for your gender and activity level. For most active people, a daily total of 2,000 calories is appropriate. If you are trying to lose weight or are not very active, you may need to eat fewer calories. Talk with your health care provider or a diet and nutrition specialist (dietitian) about how many calories you need each day.  Choose healthy foods, such as: ? Fruits and vegetables. At each meal, try to fill at least half of your plate with fruits and vegetables. ? Whole grains, such as whole-wheat bread, brown rice, and quinoa. ?  Lean meats, such as chicken or fish. ? Other healthy proteins, such as beans, eggs, or tofu. ? Healthy fats, such as nuts, seeds, fatty fish, and olive oil. ? Low-fat or fat-free dairy products.  Check food labels, and avoid food and drinks that: ? Are high in calories. ? Have added sugar. ? Are high in sodium. ? Have saturated fats or trans fats.  Cook foods in healthier ways, such as by baking, broiling, or grilling.  Make a meal plan for the week, and shop with a grocery list to help you stay on track with your purchases. Try to avoid going to the grocery store when you are hungry.  When grocery shopping, try to  shop around the outside of the store first, where the fresh foods are. Doing this helps you to avoid prepackaged foods, which can be high in sugar, salt (sodium), and fat. What lifestyle changes can be made?   Exercise for 30 or more minutes on 5 or more days each week. Exercising may include brisk walking, yard work, biking, running, swimming, and team sports like basketball and soccer. Ask your health care provider which exercises are safe for you.  Do muscle-strengthening activities, such as lifting weights or using resistance bands, on 2 or more days a week.  Do not use any products that contain nicotine or tobacco, such as cigarettes and e-cigarettes. If you need help quitting, ask your health care provider.  Limit alcohol intake to no more than 1 drink a day for nonpregnant women and 2 drinks a day for men. One drink equals 12 oz of beer, 5 oz of wine, or 1 oz of hard liquor.  Try to get 7-9 hours of sleep each night. What other changes can be made?  Keep a food and activity journal to keep track of: ? What you ate and how many calories you had. Remember to count the calories in sauces, dressings, and side dishes. ? Whether you were active, and what exercises you did. ? Your calorie, weight, and activity goals.  Check your weight regularly. Track any changes. If you notice you have gained weight, make changes to your diet or activity routine.  Avoid taking weight-loss medicines or supplements. Talk to your health care provider before starting any new medicine or supplement.  Talk to your health care provider before trying any new diet or exercise plan. Why are these changes important? Eating healthy, staying active, and having healthy habits can help you to prevent obesity. Those changes also:  Help you manage stress and emotions.  Help you connect with friends and family.  Improve your self-esteem.  Improve your sleep.  Prevent long-term health problems. What can happen  if changes are not made? Being obese or overweight can cause you to develop joint or bone problems, which can make it hard for you to stay active or do activities you enjoy. Being obese or overweight also puts stress on your heart and lungs and can lead to health problems like diabetes, heart disease, and some cancers. Where to find more information Talk with your health care provider or a dietitian about healthy eating and healthy lifestyle choices. You may also find information from:  [place] Department of Agriculture, MyPlate: https://ball-collins.biz/  American Heart Association: www.heart.org  Centers for Disease Control and Prevention: FootballExhibition.com.br Summary  Staying at a healthy weight is important to your overall health. It helps you to prevent certain diseases and health problems, such as heart disease, diabetes, joint problems, sleep disorders, and  some types of cancer.  Being obese or overweight can cause you to develop joint or bone problems, which can make it hard for you to stay active or do activities you enjoy.  You can prevent unhealthy weight gain by eating a healthy diet, exercising regularly, not smoking, limiting alcohol, and getting enough sleep.  Talk with your health care provider or a dietitian for guidance about healthy eating and healthy lifestyle choices. This information is not intended to replace advice given to you by your health care provider. Make sure you discuss any questions you have with your health care provider. Document Released: 07/29/2016 Document Revised: 05/08/2017 Document Reviewed: 09/03/2016 Elsevier Interactive Patient Education  2019 Elsevier Inc.  Obesity, Adult Obesity is the condition of having too much total body fat. Being overweight or obese means that your weight is greater than what is considered healthy for your body size. Obesity is determined by a measurement called BMI. BMI is an estimate of body fat and is calculated from height and weight.  For adults, a BMI of 30 or higher is considered obese. Obesity can eventually lead to other health concerns and major illnesses, including:  Stroke.  Coronary artery disease (CAD).  Type 2 diabetes.  Some types of cancer, including cancers of the colon, breast, uterus, and gallbladder.  Osteoarthritis.  High blood pressure (hypertension).  High cholesterol.  Sleep apnea.  Gallbladder stones.  Infertility problems. What are the causes? The main cause of obesity is taking in (consuming) more calories than your body uses for energy. Other factors that contribute to this condition may include:  Being born with genes that make you more likely to become obese.  Having a medical condition that causes obesity. These conditions include: ? Hypothyroidism. ? Polycystic ovarian syndrome (PCOS). ? Binge-eating disorder. ? Cushing syndrome.  Taking certain medicines, such as steroids, antidepressants, and seizure medicines.  Not being physically active (sedentary lifestyle).  Living where there are limited places to exercise safely or buy healthy foods.  Not getting enough sleep. What increases the risk? The following factors may increase your risk of this condition:  Having a family history of obesity.  Being a woman of African-American descent.  Being a man of Hispanic descent. What are the signs or symptoms? Having excessive body fat is the main symptom of this condition. How is this diagnosed? This condition may be diagnosed based on:  Your symptoms.  Your medical history.  A physical exam. Your health care provider may measure: ? Your BMI. If you are an adult with a BMI between 25 and less than 30, you are considered overweight. If you are an adult with a BMI of 30 or higher, you are considered obese. ? The distances around your hips and your waist (circumferences). These may be compared to each other to help diagnose your condition. ? Your skinfold thickness. Your  health care provider may gently pinch a fold of your skin and measure it. How is this treated? Treatment for this condition often includes changing your lifestyle. Treatment may include some or all of the following:  Dietary changes. Work with your health care provider and a dietitian to set a weight-loss goal that is healthy and reasonable for you. Dietary changes may include eating: ? Smaller portions. A portion size is the amount of a particular food that is healthy for you to eat at one time. This varies from person to person. ? Low-calorie or low-fat options. ? More whole grains, fruits, and vegetables.  Regular  physical activity. This may include aerobic activity (cardio) and strength training.  Medicine to help you lose weight. Your health care provider may prescribe medicine if you are unable to lose 1 pound a week after 6 weeks of eating more healthily and doing more physical activity.  Surgery. Surgical options may include gastric banding and gastric bypass. Surgery may be done if: ? Other treatments have not helped to improve your condition. ? You have a BMI of 40 or higher. ? You have life-threatening health problems related to obesity. Follow these instructions at home:  Eating and drinking   Follow recommendations from your health care provider about what you eat and drink. Your health care provider may advise you to: ? Limit fast foods, sweets, and processed snack foods. ? Choose low-fat options, such as low-fat milk instead of whole milk. ? Eat 5 or more servings of fruits or vegetables every day. ? Eat at home more often. This gives you more control over what you eat. ? Choose healthy foods when you eat out. ? Learn what a healthy portion size is. ? Keep low-fat snacks on hand. ? Avoid sugary drinks, such as soda, fruit juice, iced tea sweetened with sugar, and flavored milk. ? Eat a healthy breakfast.  Drink enough water to keep your urine clear or pale  yellow.  Do not go without eating for long periods of time (do not fast) or follow a fad diet. Fasting and fad diets can be unhealthy and even dangerous. Physical Activity  Exercise regularly, as told by your health care provider. Ask your health care provider what types of exercise are safe for you and how often you should exercise.  Warm up and stretch before being active.  Cool down and stretch after being active.  Rest between periods of activity. Lifestyle  Limit the time that you spend in front of your TV, computer, or video game system.  Find ways to reward yourself that do not involve food.  Limit alcohol intake to no more than 1 drink a day for nonpregnant women and 2 drinks a day for men. One drink equals 12 oz of beer, 5 oz of wine, or 1 oz of hard liquor. General instructions  Keep a weight loss journal to keep track of the food you eat and how much you exercise you get.  Take over-the-counter and prescription medicines only as told by your health care provider.  Take vitamins and supplements only as told by your health care provider.  Consider joining a support group. Your health care provider may be able to recommend a support group.  Keep all follow-up visits as told by your health care provider. This is important. Contact a health care provider if:  You are unable to meet your weight loss goal after 6 weeks of dietary and lifestyle changes. This information is not intended to replace advice given to you by your health care provider. Make sure you discuss any questions you have with your health care provider. Document Released: 09/04/2004 Document Revised: 12/31/2015 Document Reviewed: 05/16/2015 Elsevier Interactive Patient Education  2019 [employer]

## 2018-07-28 NOTE — Assessment & Plan Note (Signed)
Controlled on four agents; encouraged DASH guidelines

## 2018-07-28 NOTE — Assessment & Plan Note (Signed)
Encouraged walking, staying active; stay hydrated; offered nutrition referral which was politely declined but she is welcome to call if desired

## 2018-07-28 NOTE — Assessment & Plan Note (Signed)
Check labs today; appt with endo in Jan

## 2018-07-28 NOTE — Assessment & Plan Note (Signed)
Goal LDL less than 70; limit saturated fats; check lipids today; continue statin

## 2018-07-28 NOTE — Assessment & Plan Note (Signed)
Foot exam by MD today; eye exam in January; check labs today; limit simple carbs and sweet drinks

## 2018-07-28 NOTE — Assessment & Plan Note (Signed)
1000 iu daily supplement

## 2018-07-28 NOTE — Progress Notes (Signed)
BP 126/72   Pulse 83   Temp 98 F (36.7 C)   Ht '5\' 2"'  (1.575 m)   Wt 173 lb 8 oz (78.7 kg)   SpO2 94%   BMI 31.73 kg/m    Subjective:    Patient ID: Amanda Lyons, female    DOB: 06/29/1956, 62 y.o.   MRN: [id number]  HPI: Amanda Lyons is a 62 y.o. female  Chief Complaint  Patient presents with  . Medication Refill  . Ear Pain    Right. Started about 2-3 weeks ago    HPI  Here for regular f/u  Type 2 diabetes; tries to limit simple carbs; not many sweets; I asked about sugary drinks, one drink a tea, she loves sweet tea; not checking FSBS with my blessing; eye exam in January, no eye damage; not on insulin  Lab Results  Component Value Date   HGBA1C 6.4 (H) 11/24/2017   High cholesterol; she is on a statin; she eats more chicken than anything; steak on the weekends; occasional sausage or something; mostly fish and chicken; does not want to give up steak; hardly ever eats bacon  Lab Results  Component Value Date   CHOL 193 11/24/2017   HDL 71 11/24/2017   LDLCALC 96 11/24/2017   TRIG 156 (H) 11/24/2017   CHOLHDL 2.7 11/24/2017   HTN; controlled with four agents; cutting back on the salt, using more pepper;   Has bunions; not interested in seeing podiatrist  Back in 2001, she had trigeminal neuralgia; had surgery by Dr. Angelene Giovanni at [facility]; tender on the right side, tender and throbbing; she wants to see if she needs to go back; cold air sets it off; hurts down behind and below the right ear, same place as before  Vit D deficiency; taking supplement daily  Elevated alk phos; was referred in April and does not have an appointment until January 5th; I asked about abd pain; she was having pains in her left side last week, but those went away; no change in the color of her stool  Depression screen [facility] 2/9 07/28/2018 03/30/2018 11/24/2017 07/23/2017 06/04/2017  Decreased Interest 0 0 0 0 0  Down, Depressed, Hopeless 0 0 0 0 0  PHQ - 2 Score 0 0 0 0 0    Altered sleeping 0 - - - -  Tired, decreased energy 0 - - - -  Change in appetite 0 - - - -  Feeling bad or failure about yourself  0 - - - -  Trouble concentrating 0 - - - -  Moving slowly or fidgety/restless 0 - - - -  Suicidal thoughts 0 - - - -  PHQ-9 Score 0 - - - -  Difficult doing work/chores Not difficult at all - - - -   Fall Risk  07/28/2018 03/30/2018 11/24/2017 07/23/2017 06/04/2017  Falls in the past year? 0 No No No No    Relevant past medical, surgical, family and social history reviewed Past Medical History:  Diagnosis Date  . Anginal pain ([place]) 12/2016   chest pain last time one year ago  . Anxiety   . Arthritis    left shoulder  . Asthma   . Chest pain 11/08/2013  . Diabetes mellitus without complication ([place])   . Dyslipidemia 11/08/2013  . Dysrhythmia    fast  . GERD (gastroesophageal reflux disease)   . History of anxiety    had anxiety attacks  . History of kidney stones   .  HNP (herniated nucleus pulposus), lumbar 05/02/2013  . Hypertension   . Left rotator cuff tear 11/24/2017  . Trigeminal neuralgia    Past Surgical History:  Procedure Laterality Date  . ABDOMINAL HYSTERECTOMY    . BACK SURGERY    . BRAIN SURGERY  2000,2001   for trigeminal neuralgia  . CARDIAC CATHETERIZATION    . gamma knife  1999   for trigeminal neuralgia  . LEFT HEART CATHETERIZATION WITH CORONARY ANGIOGRAM N/A 10/11/2014   Procedure: LEFT HEART CATHETERIZATION WITH CORONARY ANGIOGRAM;  Surgeon: Sinclair Grooms, MD;  Location: [facility] C[facility] LAB;  Service: Cardiovascular;  Laterality: N/A;  . LUMBAR LAMINECTOMY/DECOMPRESSION MICRODISCECTOMY  04/19/2003   Right L5-S1 intralaminar laminotomy for excision of herniated disk with the operating microscope  . LUMBAR LAMINECTOMY/DECOMPRESSION MICRODISCECTOMY Right 05/02/2013   Procedure: Right Lumbar Five Sacral One Microdiskectomy;  Surgeon: Charlie Pitter, MD;  Location: [place] NEURO ORS;  Service: Neurosurgery;  Laterality: Right;  LUMBAR  LAMINECTOMY/DECOMPRESSION MICRODISCECTOMY 1 LEVEL  . SHOULDER ARTHROSCOPY WITH OPEN ROTATOR CUFF REPAIR Right 05/28/2016   Procedure: SHOULDER ARTHROSCOPY WITH OPEN ROTATOR CUFF REPAIR;  Surgeon: Earnestine Leys, MD;  Location: [facility] ORS;  Service: Orthopedics;  Laterality: Right;  . SHOULDER ARTHROSCOPY WITH OPEN ROTATOR CUFF REPAIR Left 01/06/2018   Procedure: SHOULDER ARTHROSCOPY WITH  ROTATOR CUFF REPAIR;  Surgeon: Earnestine Leys, MD;  Location: [facility] ORS;  Service: Orthopedics;  Laterality: Left;   Family History  Problem Relation Age of Onset  . CVA Father   . Heart failure Father   . Heart disease Father   . Diabetes Father   . Hypertension Father   . Ovarian cancer Mother        early 51's  . Cancer Mother   . Heart failure Mother   . Heart failure Maternal Grandmother   . Heart attack Maternal Grandfather   . Diabetes Sister   . Hypertension Sister   . Hypertension Sister    Social History   Tobacco Use  . Smoking status: Never Smoker  . Smokeless tobacco: Never Used  Substance Use Topics  . Alcohol use: No  . Drug use: No     [facility] from [facility]/28/2018 [facility]  AUDIT-C Score  0      Interim medical history since last visit reviewed. Allergies and medications reviewed  Review of Systems  Constitutional: Negative for unexpected weight change.  Respiratory: Negative for wheezing.   Cardiovascular: Negative for chest pain.  Musculoskeletal:       Right shoulder pain and spasms; she is left-handed; seeing ortho; meloxicam does help   Per HPI unless specifically indicated above     Objective:    BP 126/72   Pulse 83   Temp 98 F (36.7 C)   Ht '5\' 2"'  (1.575 m)   Wt 173 lb 8 oz (78.7 kg)   SpO2 94%   BMI 31.73 kg/m   Wt Readings from Last 3 Encounters:  07/28/18 173 lb 8 oz (78.7 kg)  03/30/18 168 lb 8 oz (76.4 kg)  01/06/18 171 lb (77.6 kg)    Physical Exam Constitutional:      General: She is not in acute distress.     Appearance: She is well-developed. She is not diaphoretic.  HENT:     Head: Normocephalic and atraumatic.     Right Ear: Hearing, tympanic membrane, ear canal and external ear normal. Tympanic membrane is not erythematous or retracted.     Left Ear: Hearing, tympanic membrane,  ear canal and external ear normal. Tympanic membrane is not erythematous or retracted.  Eyes:     General: No scleral icterus. Neck:     Thyroid: No thyromegaly.  Cardiovascular:     Rate and Rhythm: Normal rate and regular rhythm.     Heart sounds: Normal heart sounds. No murmur.  Pulmonary:     Effort: Pulmonary effort is normal. No respiratory distress.     Breath sounds: Normal breath sounds. No wheezing.  Abdominal:     General: Bowel sounds are normal. There is no distension.     Palpations: Abdomen is soft.  Skin:    General: Skin is warm and dry.     Coloration: Skin is not pale.  Neurological:     Mental Status: She is alert.  Psychiatric:        Behavior: Behavior normal.        Thought Content: Thought content normal.        Judgment: Judgment normal.    Diabetic Foot Form - Detailed   Diabetic Foot Exam - detailed Diabetic Foot exam was performed with the following findings:  Yes 07/28/2018  8:33 AM  Visual Foot Exam completed.:  Yes  Pulse Foot Exam completed.:  Yes  Right Dorsalis Pedis:  Present Left Dorsalis Pedis:  Present  Sensory Foot Exam Completed.:  Yes Semmes-Weinstein Monofilament Test R Site 1-Great Toe:  Pos L Site 1-Great Toe:  Pos    Comments:  Bunions, high riding 4th toes     Results for orders placed or performed in visit on 03/30/18  VITAMIN D 25 Hydroxy (Vit-D Deficiency, Fractures)  Result Value Ref Range   Vit D, 25-Hydroxy 29 (L) 30 - 100 ng/mL  TSH  Result Value Ref Range   TSH 1.12 0.40 - 4.50 mIU/L  CBC with Differential/Platelet  Result Value Ref Range   WBC 6.7 3.8 - 10.8 Thousand/uL   RBC 4.78 3.80 - 5.10 Million/uL   Hemoglobin 13.7 11.7 - 15.5  g/dL   HCT 40.9 35.0 - 45.0 %   MCV 85.6 80.0 - 100.0 fL   MCH 28.7 27.0 - 33.0 pg   MCHC 33.5 32.0 - 36.0 g/dL   RDW 12.7 11.0 - 15.0 %   Platelets 339 140 - 400 Thousand/uL   MPV 9.4 7.5 - 12.5 fL   Neutro Abs 4,355 1,500 - 7,800 cells/uL   Lymphs Abs 1,541 850 - 3,900 cells/uL   WBC mixed population 583 200 - 950 cells/uL   Eosinophils Absolute 181 15 - 500 cells/uL   Basophils Absolute 40 0 - 200 cells/uL   Neutrophils Relative % 65 %   Total Lymphocyte 23.0 %   Monocytes Relative 8.7 %   Eosinophils Relative 2.7 %   Basophils Relative 0.6 %  Vitamin B12  Result Value Ref Range   Vitamin B-12 924 200 - 1,100 pg/mL  COMPLETE METABOLIC PANEL WITH GFR  Result Value Ref Range   Glucose, Bld 106 65 - 139 mg/dL   BUN 23 7 - 25 mg/dL   Creat 0.62 0.50 - 0.99 mg/dL   GFR, Est Non African American 98 > OR = 60 mL/min/1.49m   GFR, Est African American 114 > OR = 60 mL/min/1.754m  BUN/Creatinine Ratio NOT APPLICABLE 6 - 22 (calc)   Sodium 139 135 - 146 mmol/L   Potassium 4.0 3.5 - 5.3 mmol/L   Chloride 103 98 - 110 mmol/L   CO2 25 20 - 32 mmol/L   Calcium 10.0  8.6 - 10.4 mg/dL   Total Protein 7.3 6.1 - 8.1 g/dL   Albumin 4.5 3.6 - 5.1 g/dL   Globulin 2.8 1.9 - 3.7 g/dL (calc)   AG Ratio 1.6 1.0 - 2.5 (calc)   Total Bilirubin 0.3 0.2 - 1.2 mg/dL   Alkaline phosphatase (APISO) 139 (H) 33 - 130 U/L   AST 23 10 - 35 U/L   ALT 38 (H) 6 - 29 U/L  Brain natriuretic peptide  Result Value Ref Range   Brain Natriuretic Peptide 30 <100 pg/mL      Assessment & Plan:   Problem List Items Addressed This Visit      Cardiovascular and Mediastinum   Hypertension (Chronic)    Controlled on four agents; encouraged DASH guidelines        Endocrine   Type 2 diabetes mellitus ([place])    Foot exam by MD today; eye exam in January; check labs today; limit simple carbs and sweet drinks      Relevant Orders   Microalbumin / creatinine urine ratio   Hemoglobin A1c     Other   Vitamin  D deficiency    1000 iu daily supplement      Obesity (BMI 30.0-34.9)    Encouraged walking, staying active; stay hydrated; offered nutrition referral which was politely declined but she is welcome to call if desired      Medication monitoring encounter    Check liver and kidneys      Relevant Orders   COMPLETE METABOLIC PANEL WITH GFR   High alkaline phosphatase    Check labs today; appt with endo in Jan      Relevant Orders   Alkaline Phosphatase Isoenzymes   Dyslipidemia (Chronic)    Goal LDL less than 70; limit saturated fats; check lipids today; continue statin      Relevant Orders   Lipid panel       Follow up plan: Return in about 6 months (around 01/27/2019) for follow-up visit with Dr. Sanda Klein.  An after-visit summary was printed and given to the patient at [place].  Please see the patient instructions which may contain other information and recommendations beyond what is mentioned above in the assessment and plan.  No orders of the defined types were placed in this encounter.   Orders Placed This Encounter  Procedures  . Microalbumin / creatinine urine ratio  . Lipid panel  . Hemoglobin A1c  . COMPLETE METABOLIC PANEL WITH GFR  . Alkaline Phosphatase Isoenzymes

## 2018-07-28 NOTE — Assessment & Plan Note (Signed)
Check liver and kidneys 

## 2018-07-29 ENCOUNTER — Other Ambulatory Visit: Payer: Self-pay | Admitting: Family Medicine

## 2018-07-29 DIAGNOSIS — E785 Hyperlipidemia, unspecified: Secondary | ICD-10-CM

## 2018-07-29 DIAGNOSIS — K760 Fatty (change of) liver, not elsewhere classified: Secondary | ICD-10-CM

## 2018-07-29 MED ORDER — ATORVASTATIN CALCIUM 20 MG PO TABS: 20.0000 mg | ORAL_TABLET | Freq: Every day | ORAL | 1 refills | Status: DC

## 2018-07-29 NOTE — Progress Notes (Signed)
Increase statin; recheck labs in 6 weeks 

## 2018-07-30 LAB — COMPLETE METABOLIC PANEL WITH GFR
AG Ratio: 1.6 (calc) (ref 1.0–2.5)
ALBUMIN MSPROF: 4.3 g/dL (ref 3.6–5.1)
ALT: 57 U/L — ABNORMAL HIGH (ref 6–29)
AST: 38 U/L — ABNORMAL HIGH (ref 10–35)
Alkaline phosphatase (APISO): 109 U/L (ref 33–130)
BUN: 22 mg/dL (ref 7–25)
CALCIUM: 10.2 mg/dL (ref 8.6–10.4)
CO2: 29 mmol/L (ref 20–32)
CREATININE: 0.68 mg/dL (ref 0.50–0.99)
Chloride: 101 mmol/L (ref 98–110)
GFR, EST NON AFRICAN AMERICAN: 95 mL/min/{1.73_m2} (ref >=60)
GFR, Est African American: 110 mL/min/{1.73_m2} (ref >=60)
GLUCOSE: 113 mg/dL (ref 65–139)
Globulin: 2.7 g/dL (calc) (ref 1.9–3.7)
Potassium: 4.1 mmol/L (ref 3.5–5.3)
Sodium: 139 mmol/L (ref 135–146)
Total Bilirubin: 0.5 mg/dL (ref 0.2–1.2)
Total Protein: 7 g/dL (ref 6.1–8.1)

## 2018-07-30 LAB — HEMOGLOBIN A1C
Hgb A1c MFr Bld: 6.5 % of total Hgb — ABNORMAL HIGH (ref <5.7)
Mean Plasma Glucose: 140 (calc)
eAG (mmol/L): 7.7 (calc)

## 2018-07-30 LAB — ALKALINE PHOSPHATASE ISOENZYMES
Alkaline phosphatase (APISO): 104 U/L (ref 33–130)
Bone Isoenzymes: 43 % (ref 28–66)
INTESTINAL ISOENZYMES (ALP ISO): 9 % (ref 1–24)
LIVER ISOENZYMES (ALP ISO): 48 % (ref 25–69)

## 2018-07-30 LAB — LIPID PANEL
Cholesterol: 179 mg/dL (ref <200)
HDL: 57 mg/dL (ref >50)
LDL Cholesterol (Calc): 96 mg/dL (calc)
Non-HDL Cholesterol (Calc): 122 mg/dL (calc) (ref <130)
TRIGLYCERIDES: 158 mg/dL — AB (ref <150)
Total CHOL/HDL Ratio: 3.1 (calc) (ref <5.0)

## 2018-07-30 LAB — MICROALBUMIN / CREATININE URINE RATIO
CREATININE, URINE: 118 mg/dL (ref 20–275)
Microalb Creat Ratio: 5 mcg/mg creat (ref <30)
Microalb, Ur: 0.6 mg/dL

## 2018-08-30 ENCOUNTER — Other Ambulatory Visit: Payer: Self-pay | Admitting: Family Medicine

## 2018-08-30 NOTE — Telephone Encounter (Signed)
I received a request for 20 mg atorvastatin I just prescribed a 2 month supply on 07/29/18 She should not be out She is due to have labs done before the 2 month supply runs out Please resolve with pharmacy and remind patient to get labs done when due

## 2018-08-30 NOTE — Telephone Encounter (Signed)
Pt is not out of medication and stated she will come in before her medicine runs out to have labs drawn.

## 2018-09-03 ENCOUNTER — Other Ambulatory Visit: Payer: Self-pay | Admitting: Family Medicine

## 2018-09-18 ENCOUNTER — Emergency Department
Admission: EM | Admit: 2018-09-18 | Discharge: 2018-09-18 | Disposition: A | Discharge status: Home / Self Care | Payer: 59 | Attending: Emergency Medicine | Admitting: Emergency Medicine

## 2018-09-18 ENCOUNTER — Other Ambulatory Visit: Payer: Self-pay

## 2018-09-18 ENCOUNTER — Encounter: Payer: Self-pay | Admitting: Emergency Medicine

## 2018-09-18 DIAGNOSIS — J45909 Unspecified asthma, uncomplicated: Secondary | ICD-10-CM | POA: Diagnosis not present

## 2018-09-18 DIAGNOSIS — I10 Essential (primary) hypertension: Secondary | ICD-10-CM | POA: Diagnosis not present

## 2018-09-18 DIAGNOSIS — Z79899 Other long term (current) drug therapy: Secondary | ICD-10-CM | POA: Diagnosis not present

## 2018-09-18 DIAGNOSIS — H16002 Unspecified corneal ulcer, left eye: Secondary | ICD-10-CM | POA: Diagnosis not present

## 2018-09-18 DIAGNOSIS — E119 Type 2 diabetes mellitus without complications: Secondary | ICD-10-CM | POA: Diagnosis not present

## 2018-09-18 DIAGNOSIS — H5789 Other specified disorders of eye and adnexa: Secondary | ICD-10-CM | POA: Diagnosis present

## 2018-09-18 MED ORDER — TETRACAINE HCL 0.5 % OP SOLN
1.0000 [drp] | Freq: Once | OPHTHALMIC | Status: DC
  Filled 2018-09-18: qty 4

## 2018-09-18 MED ORDER — MOXIFLOXACIN HCL 0.5 % OP SOLN: 1.0000 [drp] | Freq: Three times a day (TID) | OPHTHALMIC | 0 refills | Status: AC

## 2018-09-18 MED ORDER — EYE WASH OPHTH SOLN
1.0000 [drp] | OPHTHALMIC | Status: DC | PRN
  Filled 2018-09-18: qty 118

## 2018-09-18 MED ORDER — FLUORESCEIN SODIUM 1 MG OP STRP
1.0000 | ORAL_STRIP | Freq: Once | OPHTHALMIC | Status: DC
  Filled 2018-09-18: qty 1

## 2018-09-18 NOTE — Discharge Instructions (Addendum)
Call your eye doctor on Monday or call [facility] for follow-up of your corneal ulcer.  Do not use your contacts until you have been seen and told that it is okay to begin using them again.  Begin today using the Vigamox drops to your left eye 3 times a day.  Avoid bright light by wearing sunglasses for eye protection.  If any severe worsening of your eye return to the emergency department over the weekend.  You may also take Tylenol as needed for pain.

## 2018-09-18 NOTE — ED Notes (Signed)
Visual Acuity Left 20/70 Right 20/50

## 2018-09-18 NOTE — ED Triage Notes (Signed)
L eye irritation x 3 days.

## 2018-09-18 NOTE — ED Provider Notes (Signed)
[facility] Emergency Department Provider Note   ____________________________________________   None    (approximate)  I have reviewed the triage vital signs and the nursing notes.   HISTORY  Chief Complaint Eye Problem   HPI Amanda Lyons is a 63 y.o. female   presents to the ED with complaint of left eye irritation for 3 days.  Patient states that she wears contacts and that she does remove them nightly.  Presently she is wearing glasses that are out of date and is not her "prescription strength".  Patient denies any injury to her eye, drainage or severe pain.  She has been using some eyedrops over-the-counter that was recommended by her optometrist.   Past Medical History:  Diagnosis Date  . Anginal pain ([facility]) 12/2016   chest pain last time one year ago  . Anxiety   . Arthritis    left shoulder  . Asthma   . Chest pain 11/08/2013  . Diabetes mellitus without complication ([facility])   . Dyslipidemia 11/08/2013  . Dysrhythmia    fast  . GERD (gastroesophageal reflux disease)   . History of anxiety    had anxiety attacks  . History of kidney stones   . HNP (herniated nucleus pulposus), lumbar 05/02/2013  . Hypertension   . Left rotator cuff tear 11/24/2017  . Trigeminal neuralgia     Patient Active Problem List   Diagnosis Date Noted  . High alkaline phosphatase 07/28/2018  . Grade I diastolic dysfunction 03/30/2018  . Fatty liver 03/30/2018  . Preoperative clearance 12/17/2017  . Left rotator cuff tear 11/24/2017  . 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 Obesity (BMI 30.0-34.9) 07/23/2017  . Chest pain 10/06/2016  . SOB (shortness of breath) 10/06/2016  . Dyspnea on exertion 09/11/2016  . Bilateral leg edema 09/11/2016  . Visit for screening mammogram 04/23/2016  . Impingement syndrome of right shoulder 04/12/2016  . Cervical radiculitis 04/09/2016  . Medication monitoring encounter 01/14/2016  . Bursitis of hip 11/12/2015  . Chronic  right hip pain 10/26/2015  . Groin pain, chronic, right 10/26/2015  . Chronic bronchitis ([facility]) 07/10/2015  . Vitamin D deficiency 07/10/2015  . Dyshidrotic eczema 05/30/2015  . Type 2 diabetes mellitus ([facility]) 05/30/2015  . Neck mass 05/02/2015  . Hoarseness of voice 05/02/2015  . Cough 05/02/2015  . Dysphagia 05/02/2015  . Dyslipidemia 11/08/2013  . Hypertension   . GERD (gastroesophageal reflux disease)   . HNP (herniated nucleus pulposus), lumbar 05/02/2013    Past Surgical History:  Procedure Laterality Date  . ABDOMINAL HYSTERECTOMY    . BACK SURGERY    . BRAIN SURGERY  2000,2001   for trigeminal neuralgia  . CARDIAC CATHETERIZATION    . gamma knife  1999   for trigeminal neuralgia  . LEFT HEART CATHETERIZATION WITH CORONARY ANGIOGRAM N/A 10/11/2014   Procedure: LEFT HEART CATHETERIZATION WITH CORONARY ANGIOGRAM;  Surgeon: Lesleigh NoeHenry W Smith III, MD;  [facility];  Service: Cardiovascular;  Laterality: N/A;  . LUMBAR LAMINECTOMY/DECOMPRESSION MICRODISCECTOMY  04/19/2003   Right L5-S1 intralaminar laminotomy for excision of herniated disk with the operating microscope  . LUMBAR LAMINECTOMY/DECOMPRESSION MICRODISCECTOMY Right 05/02/2013   Procedure: Right Lumbar Five Sacral One Microdiskectomy;  Surgeon: Temple PaciniHenry A Pool, MD;  Location: [facility] NEURO ORS;  Service: Neurosurgery;  Laterality: Right;  LUMBAR LAMINECTOMY/DECOMPRESSION MICRODISCECTOMY 1 LEVEL  . SHOULDER ARTHROSCOPY WITH OPEN ROTATOR CUFF REPAIR Right 05/28/2016   Procedure: SHOULDER ARTHROSCOPY WITH OPEN ROTATOR CUFF REPAIR;  Surgeon: Deeann SaintHoward Miller, MD;  Location: [facility] ORS;  Service: Orthopedics;  Laterality: Right;  . SHOULDER ARTHROSCOPY WITH OPEN ROTATOR CUFF REPAIR Left 01/06/2018   Procedure: SHOULDER ARTHROSCOPY WITH  ROTATOR CUFF REPAIR;  Surgeon: Deeann Saint, MD;  Location: [facility] ORS;  Service: Orthopedics;  Laterality: Left;    Prior to Admission medications   Medication Sig Start Date End Date Taking?  Authorizing Provider  albuterol (PROVENTIL HFA;VENTOLIN HFA) 108 (90 Base) MCG/ACT inhaler Inhale 2 puffs into the lungs every 6 (six) hours as needed for wheezing or shortness of breath. 10/05/17   Merwyn Katos, MD  amLODipine (NORVASC) 2.5 MG tablet Take 1 tablet (2.5 mg total) by mouth daily. 03/30/18   Lars Masson, MD  aspirin EC 81 MG EC tablet Take 1 tablet (81 mg total) by mouth daily. 11/09/13   Leroy Sea, MD  atorvastatin (LIPITOR) 20 MG tablet Take 1 tablet (20 mg total) by mouth at bedtime. 07/29/18   Kerman Passey, MD  Cyanocobalamin (VITAMIN B-12 ER PO) Take 500 mg by mouth every other day.     [provider]  fluticasone furoate-vilanterol (BREO ELLIPTA) 100-25 MCG/INH AEPB Inhale 1 puff into the lungs daily. 10/05/17   Merwyn Katos, MD  gabapentin (NEURONTIN) 400 MG capsule Take 1 capsule (400 mg total) by mouth 2 (two) times daily. Patient taking differently: Take 400 mg by mouth as needed.  01/06/18   Deeann Saint, MD  hydrochlorothiazide (HYDRODIURIL) 25 MG tablet Take 1 tablet (25 mg total) by mouth daily. 03/30/18   Lars Masson, MD  losartan (COZAAR) 100 MG tablet TAKE 1 TABLET (100 MG TOTAL) BY MOUTH DAILY. THIS REPLACES THE COMBO BP MEDICINE 09/29/17   Kerman Passey, MD  meloxicam (MOBIC) 15 MG tablet Take 15 mg by mouth daily. 11/16/17   [provider]  metFORMIN (GLUCOPHAGE-XR) 500 MG 24 hr tablet TAKE 1 TABLET (500 MG TOTAL) BY MOUTH DAILY WITH LUNCH. 07/07/18   Lada, Janit Bern, MD  metoprolol succinate (TOPROL-XL) 25 MG 24 hr tablet TAKE 1 TABLET (25 MG TOTAL) BY MOUTH DAILY. 09/03/18   Lada, Janit Bern, MD  moxifloxacin (VIGAMOX) 0.5 % ophthalmic solution Place 1 drop into both eyes 3 (three) times daily for 7 days. 09/18/18 09/25/18  Tommi Rumps, PA-C  nitroGLYCERIN (NITROSTAT) 0.4 MG SL tablet Place 1 tablet (0.4 mg total) under the tongue every 5 (five) minutes as needed for chest pain (CP or SOB). 11/09/13   Leroy Sea,  MD  omeprazole (PRILOSEC) 40 MG capsule Take 40 mg by mouth daily.  05/03/15   [provider]  oxyCODONE (ROXICODONE) 5 MG immediate release tablet Take 1 tablet (5 mg total) by mouth every 8 (eight) hours as needed. Patient not taking: Reported on 03/30/2018 01/06/18 01/06/19  Deeann Saint, MD  oxyCODONE-acetaminophen (PERCOCET) 5-325 MG tablet Take 1 tablet by mouth as needed (for pain).     [provider]    Allergies Prednisone; Sulfa antibiotics; and Tramadol  Family History  Problem Relation Age of Onset  . CVA Father   . Heart failure Father   . Heart disease Father   . Diabetes Father   . Hypertension Father   . Ovarian cancer Mother        early 61's  . Cancer Mother   . Heart failure Mother   . Heart failure Maternal Grandmother   . Heart attack Maternal Grandfather   . Diabetes Sister   . Hypertension Sister   . Hypertension Sister  Social History Social History   Tobacco Use  . Smoking status: Never Smoker  . Smokeless tobacco: Never Used  Substance Use Topics  . Alcohol use: No  . Drug use: No    Review of Systems Constitutional: No fever/chills Eyes: Positive for left eye irritation. ENT: No sore throat. Cardiovascular: Denies chest pain. Gastrointestinal:   No nausea, no vomiting.  Musculoskeletal: Negative for back pain. Skin: Negative for rash.  Neurological: Negative for headaches, focal weakness or numbness. ___________________________________________   PHYSICAL EXAM:  VITAL SIGNS: ED Triage Vitals  Enc Vitals Group     BP 09/18/18 0756 (!) 182/82     Pulse Rate 09/18/18 0756 73     Resp 09/18/18 0756 20     Temp 09/18/18 0756 98.2 F (36.8 C)     Temp Source 09/18/18 0756 Oral     SpO2 09/18/18 0756 96 %     Weight 09/18/18 0758 168 lb (76.2 kg)     Height 09/18/18 0758 5\' 2"  (1.575 m)     Head Circumference --      Peak Flow --      Pain Score 09/18/18 0758 9     Pain Loc --      Pain Edu? --      Excl. in  GC? --    Constitutional: Alert and oriented. Well appearing and in no acute distress. Eyes: Conjunctivae are normal. PERRL. EOMI. left eye is minimally irritated no drainage is noted.  No foreign body is noted and left lid was everted with no foreign body noted.  There is a small pinpoint corneal ulcer noted on the outer rim at approximately 3 o'clock position.  Fluorescein uptake in this area. Head: Atraumatic. Mouth/Throat: Mucous membranes are moist.  Oropharynx non-erythematous. Neck: No stridor.   Cardiovascular: Normal rate, regular rhythm. Grossly normal heart sounds.  Good peripheral circulation. Respiratory: Normal respiratory effort.  No retractions. Lungs CTAB. Musculoskeletal: Moves upper and lower extremities without any difficulty and normal gait was noted. Neurologic:  Normal speech and language. No gross focal neurologic deficits are appreciated. No gait instability. Skin:  Skin is warm, dry and intact. No rash noted. Psychiatric: Mood and affect are normal. Speech and behavior are normal.  ____________________________________________   LABS (all labs ordered are listed, but only abnormal results are displayed)  Labs Reviewed - No data to display  PROCEDURES  Procedure(s) performed: As noted above.  Procedures  Critical Care performed: No  ____________________________________________   INITIAL IMPRESSION / ASSESSMENT AND PLAN / ED COURSE  As part of my medical decision making, I reviewed the following data within the electronic MEDICAL RECORD NUMBER Notes from prior ED visits and [place] Controlled Substance Database  Patient presents to the ED with complaint of left eye irritation for which she has been using over-the-counter drops that were recommended by her optometrist.  Patient is a contact wearer and states that she does take them out at night to clean.  She denies any visual changes other than she is wearing a pair of old glasses that are not updated and therefore  her vision is slightly decreased.  Exam shows a small pinpoint corneal ulcer at 3 o'clock position.  Patient was given a prescription for Vigamox.  She is encouraged to follow-up with [facility] Monday morning or to return to the emergency department over the weekend if any worsening of her symptoms.  Patient reports that she may follow-up with her optometrist instead.  She was still given  the contact information for Dr. Druscilla Brownie who is on-call for [facility].  ____________________________________________   FINAL CLINICAL IMPRESSION(S) / ED DIAGNOSES  Final diagnoses:  Ulcer of left cornea     ED Discharge Orders         Ordered    moxifloxacin (VIGAMOX) 0.5 % ophthalmic solution  3 times daily     09/18/18 0955           Note:  This document was prepared using Dragon voice recognition software and may include unintentional dictation errors.    Tommi Rumps, PA-C 09/18/18 1647    Sharyn Creamer, MD 09/19/18 [phone]

## 2018-09-22 ENCOUNTER — Other Ambulatory Visit: Payer: Self-pay | Admitting: Family Medicine

## 2018-09-22 NOTE — Telephone Encounter (Signed)
Lab Results  Component Value Date   CREATININE 0.68 07/28/2018   Lab Results  Component Value Date   K 4.1 07/28/2018

## 2018-09-23 ENCOUNTER — Other Ambulatory Visit: Payer: Self-pay | Admitting: Cardiology

## 2018-10-01 ENCOUNTER — Other Ambulatory Visit: Payer: Self-pay | Admitting: Family Medicine

## 2018-10-01 NOTE — Telephone Encounter (Signed)
Patient is overdue for labs (lipid and hepatic function panel) Please ask her to come in ASAP for labs I'll send 7 day supply, but we want to make sure dose is correct

## 2018-10-04 MED ORDER — HYDROCHLOROTHIAZIDE 25 MG PO TABS: 25.0000 mg | ORAL_TABLET | Freq: Every day | ORAL | 0 refills | Status: DC

## 2018-10-04 NOTE — Telephone Encounter (Signed)
Pt.notified

## 2018-10-06 ENCOUNTER — Other Ambulatory Visit: Payer: Self-pay | Admitting: Family Medicine

## 2018-10-06 ENCOUNTER — Other Ambulatory Visit: Payer: Self-pay

## 2018-10-06 DIAGNOSIS — K760 Fatty (change of) liver, not elsewhere classified: Secondary | ICD-10-CM

## 2018-10-06 DIAGNOSIS — E785 Hyperlipidemia, unspecified: Secondary | ICD-10-CM

## 2018-10-06 LAB — LIPID PANEL
CHOL/HDL RATIO: 2.6 (calc) (ref <5.0)
Cholesterol: 150 mg/dL (ref <200)
HDL: 58 mg/dL (ref >=50)
LDL Cholesterol (Calc): 75 mg/dL (calc)
NON-HDL CHOLESTEROL (CALC): 92 mg/dL (ref <130)
Triglycerides: 90 mg/dL (ref <150)

## 2018-10-06 LAB — HEPATIC FUNCTION PANEL
AG Ratio: 1.9 (calc) (ref 1.0–2.5)
ALT: 46 U/L — ABNORMAL HIGH (ref 6–29)
AST: 27 U/L (ref 10–35)
Albumin: 4.1 g/dL (ref 3.6–5.1)
Alkaline phosphatase (APISO): 86 U/L (ref 37–153)
BILIRUBIN DIRECT: 0.1 mg/dL (ref 0.0–0.2)
Globulin: 2.2 g/dL (calc) (ref 1.9–3.7)
Indirect Bilirubin: 0.3 mg/dL (calc) (ref 0.2–1.2)
Total Bilirubin: 0.4 mg/dL (ref 0.2–1.2)
Total Protein: 6.3 g/dL (ref 6.1–8.1)

## 2018-10-06 MED ORDER — ATORVASTATIN CALCIUM 20 MG PO TABS: 20.0000 mg | ORAL_TABLET | Freq: Every day | ORAL | 1 refills | Status: DC

## 2018-10-06 NOTE — Progress Notes (Signed)
Amanda Lyons, please let the patient know that she has brought her total cholesterol down 29 points, and her LDL has dropped 21 points. Her triglycerides even cam down 68 points. I am so pleased with her improvement. I sent in more cholesterol medicine refills for her. Encourage her to cut back further on saturated fats and try to lose a little bit of weight. Her liver enzymes have improved with the better cholesterol, typical for fatty liver. A diet low in saturated fats and fried foods will help. Thank you

## 2018-10-11 ENCOUNTER — Other Ambulatory Visit: Payer: Self-pay | Admitting: Family Medicine

## 2018-10-12 NOTE — Telephone Encounter (Signed)
Lab Results  Component Value Date   CREATININE 0.68 07/28/2018   Lab Results  Component Value Date   HGBA1C 6.5 (H) 07/28/2018

## 2018-12-26 ENCOUNTER — Other Ambulatory Visit: Payer: Self-pay | Admitting: Cardiology

## 2019-01-02 ENCOUNTER — Other Ambulatory Visit: Payer: Self-pay | Admitting: Family Medicine

## 2019-01-27 ENCOUNTER — Ambulatory Visit: Payer: 59 | Admitting: Family Medicine

## 2019-01-28 ENCOUNTER — Ambulatory Visit: Payer: 59 | Admitting: Nurse Practitioner

## 2019-01-28 ENCOUNTER — Other Ambulatory Visit: Payer: Self-pay

## 2019-01-28 ENCOUNTER — Encounter: Payer: Self-pay | Admitting: Nurse Practitioner

## 2019-01-28 VITALS — BP 130/72 | HR 68 | Temp 98.0°F | Resp 14 | Ht 62.0 in | Wt 173.5 lb

## 2019-01-28 DIAGNOSIS — E1100 Type 2 diabetes mellitus with hyperosmolarity without nonketotic hyperglycemic-hyperosmolar coma (NKHHC): Secondary | ICD-10-CM | POA: Diagnosis not present

## 2019-01-28 DIAGNOSIS — E785 Hyperlipidemia, unspecified: Secondary | ICD-10-CM

## 2019-01-28 DIAGNOSIS — J410 Simple chronic bronchitis: Secondary | ICD-10-CM | POA: Diagnosis not present

## 2019-01-28 DIAGNOSIS — I10 Essential (primary) hypertension: Secondary | ICD-10-CM | POA: Diagnosis not present

## 2019-01-28 DIAGNOSIS — Z1239 Encounter for other screening for malignant neoplasm of breast: Secondary | ICD-10-CM

## 2019-01-28 DIAGNOSIS — G500 Trigeminal neuralgia: Secondary | ICD-10-CM

## 2019-01-28 DIAGNOSIS — K219 Gastro-esophageal reflux disease without esophagitis: Secondary | ICD-10-CM

## 2019-01-28 DIAGNOSIS — F419 Anxiety disorder, unspecified: Secondary | ICD-10-CM

## 2019-01-28 LAB — POCT GLYCOSYLATED HEMOGLOBIN (HGB A1C)
HbA1c POC (<> result, manual entry): 6.6 % (ref 4.0–5.6)
HbA1c, POC (controlled diabetic range): 6.6 % (ref 0.0–7.0)
HbA1c, POC (prediabetic range): 6.6 % — AB (ref 5.7–6.4)
Hemoglobin A1C: 6.6 % — AB (ref 4.0–5.6)

## 2019-01-28 MED ORDER — METOPROLOL SUCCINATE ER 25 MG PO TB24: 25.0000 mg | ORAL_TABLET | Freq: Every day | ORAL | 1 refills | Status: AC

## 2019-01-28 MED ORDER — ATORVASTATIN CALCIUM 20 MG PO TABS: 20.0000 mg | ORAL_TABLET | Freq: Every day | ORAL | 1 refills | Status: DC

## 2019-01-28 MED ORDER — HYDROCHLOROTHIAZIDE 25 MG PO TABS: 25.0000 mg | ORAL_TABLET | Freq: Every day | ORAL | 1 refills | Status: AC

## 2019-01-28 MED ORDER — METFORMIN HCL ER 500 MG PO TB24: 500.0000 mg | ORAL_TABLET | Freq: Every day | ORAL | 1 refills | Status: AC

## 2019-01-28 MED ORDER — LOSARTAN POTASSIUM 100 MG PO TABS: 100.0000 mg | ORAL_TABLET | Freq: Every day | ORAL | 1 refills | Status: DC

## 2019-01-28 MED ORDER — SERTRALINE HCL 50 MG PO TABS: 50.0000 mg | ORAL_TABLET | Freq: Every day | ORAL | 3 refills | Status: DC

## 2019-01-28 NOTE — Progress Notes (Signed)
Name: Amanda Lyons   MRN: [id number]    DOB: Jan 20, 1956   Date:01/28/2019       Progress Note  Subjective  Chief Complaint  Chief Complaint  Patient presents with  . Follow-up    HPI  Trigeminal neuralgia on right side of face- has been taking gabapentin, had surgery-Gamma Knife stereotactic radiosurgery in the past, pain is coming back and is scheduled for surgery again in august due to re-occurrence of pain.   Hypertension Patient is on amlodipine 2.5mg  daily, HCTZ 25mg  daily, losartan 100mg  daily and metoprolol 25mg  daily.  Takes medications as prescribed with no missed doses a month.  She is relatively compliant with low-salt diet.  Denies chest pain, headaches, blurry vision.  Hyperlipidemia Patient rx atorvastatin 20mg  daily.Takes medications as prescribed with no missed doses a month.  Fried foods- occasionally, usually grills a lot. Denies myalgias  Diabetes Mellitus Patient is rx metformin 500mg  daily. Takes medications as prescribed with no missed doses a month.  Does not check blood sugars at home  Denies polyphagia, polydipsia, polyuria.  Lab Results  Component Value Date   HGBA1C 6.5 (H) 07/28/2018   Chronic bronchitis Takes breo daily and albuterol as needed Follows up with Dr. Alva Garnet.  Never smoker.  GERD Takes prilosec daily, triggered by spicy foods and late night snacking.   Anxiety Patient works for the [employer] and states has been under a lot of stress the last 3-4 months states she has noticed her overall anxiety is increased and she is on edge a lot. She was prescribed lorazepam daily in the past and it helped. Patient denies panic attacks.   PHQ2/9: Depression screen [facility] 2/9 01/28/2019 07/28/2018 03/30/2018 11/24/2017 07/23/2017  Decreased Interest 0 0 0 0 0  Down, Depressed, Hopeless 0 0 0 0 0  PHQ - 2 Score 0 0 0 0 0  Altered sleeping 0 0 - - -  Tired, decreased energy 0 0 - - -  Change in appetite 0 0 - - -  Feeling bad or failure  about yourself  0 0 - - -  Trouble concentrating 0 0 - - -  Moving slowly or fidgety/restless 0 0 - - -  Suicidal thoughts 0 0 - - -  PHQ-9 Score 0 0 - - -  Difficult doing work/chores Not difficult at all Not difficult at all - - -    PHQ reviewed. Negative  Patient Active Problem List   Diagnosis Date Noted  . High alkaline phosphatase 07/28/2018  . Grade I diastolic dysfunction 23/55/7322  . Fatty liver 03/30/2018  . Left rotator cuff tear 11/24/2017  . 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 Obesity (BMI 30.0-34.9) 07/23/2017  . Impingement syndrome of right shoulder 04/12/2016  . Cervical radiculitis 04/09/2016  . Chronic right hip pain 10/26/2015  . Groin pain, chronic, right 10/26/2015  . Chronic bronchitis ([place]) 07/10/2015  . Vitamin D deficiency 07/10/2015  . Dyshidrotic eczema 05/30/2015  . Type 2 diabetes mellitus ([place]) 05/30/2015  . Dysphagia 05/02/2015  . Dyslipidemia 11/08/2013  . Hypertension   . GERD (gastroesophageal reflux disease)   . HNP (herniated nucleus pulposus), lumbar 05/02/2013    Past Medical History:  Diagnosis Date  . Anginal pain ([place]) 12/2016   chest pain last time one year ago  . Anxiety   . Arthritis    left shoulder  . Asthma   . Chest pain 11/08/2013  . Diabetes mellitus without complication ([place])   . Dyslipidemia 11/08/2013  . Dysrhythmia  fast  . GERD (gastroesophageal reflux disease)   . History of anxiety    had anxiety attacks  . History of kidney stones   . HNP (herniated nucleus pulposus), lumbar 05/02/2013  . Hypertension   . Left rotator cuff tear 11/24/2017  . Trigeminal neuralgia     Past Surgical History:  Procedure Laterality Date  . ABDOMINAL HYSTERECTOMY    . BACK SURGERY    . BRAIN SURGERY  2000,2001   for trigeminal neuralgia  . CARDIAC CATHETERIZATION    . gamma knife  1999   for trigeminal neuralgia  . LEFT HEART CATHETERIZATION WITH CORONARY ANGIOGRAM N/A 10/11/2014   Procedure: LEFT HEART  CATHETERIZATION WITH CORONARY ANGIOGRAM;  Surgeon: Lesleigh NoeHenry W Smith III, MD;  [facility];  Service: Cardiovascular;  Laterality: N/A;  . LUMBAR LAMINECTOMY/DECOMPRESSION MICRODISCECTOMY  04/19/2003   Right L5-S1 intralaminar laminotomy for excision of herniated disk with the operating microscope  . LUMBAR LAMINECTOMY/DECOMPRESSION MICRODISCECTOMY Right 05/02/2013   Procedure: Right Lumbar Five Sacral One Microdiskectomy;  Surgeon: Temple PaciniHenry A Pool, MD;  [facility]: [facility] NEUR[facility]S;  Service: Neurosurgery;  Laterality: Right;  LUMBAR LAMINECTOMY/DECOMPRESSION MICRODISCECTOMY 1 LEVEL  . SHOULDER ARTHROSCOPY WITH OPEN ROTATOR CUFF REPAIR Right 05/28/2016   Procedure: SHOULDER ARTHROSCOPY WITH OPEN ROTATOR CUFF REPAIR;  Surgeon: Deeann SaintHoward Miller, MD;  Location: [facility] ORS;  Service: Orthopedics;  Laterality: Right;  . SHOULDER ARTHROSCOPY WITH OPEN ROTATOR CUFF REPAIR Left 01/06/2018   Procedure: SHOULDER ARTHROSCOPY WITH  ROTATOR CUFF REPAIR;  Surgeon: Deeann SaintMiller, Howard, MD;  Location: [facility] ORS;  Service: Orthopedics;  Laterality: Left;    Social History   Tobacco Use  . Smoking status: Never Smoker  . Smokeless tobacco: Never Used  Substance Use Topics  . Alcohol use: No     Current Outpatient Medications:  .  albuterol (PROVENTIL HFA;VENTOLIN HFA) 108 (90 Base) MCG/ACT inhaler, Inhale 2 puffs into the lungs every 6 (six) hours as needed for wheezing or shortness of breath., Disp: 1 Inhaler, Rfl: 5 .  amLODipine (NORVASC) 2.5 MG tablet, TAKE 1 TABLET BY MOUTH EVERY DAY, Disp: 90 tablet, Rfl: 1 .  aspirin EC 81 MG EC tablet, Take 1 tablet (81 mg total) by mouth daily., Disp: 30 tablet, Rfl: 0 .  atorvastatin (LIPITOR) 20 MG tablet, Take 1 tablet (20 mg total) by mouth at bedtime., Disp: 90 tablet, Rfl: 1 .  Cyanocobalamin (VITAMIN B-12 ER PO), Take 500 mg by mouth every other day. , Disp: , Rfl:  .  fluticasone furoate-vilanterol (BREO ELLIPTA) 100-25 MCG/INH AEPB, Inhale 1 puff into the lungs daily.  (Patient taking differently: Inhale 1 puff into the lungs as needed. ), Disp: 1 each, Rfl: 5 .  gabapentin (NEURONTIN) 400 MG capsule, Take 1 capsule (400 mg total) by mouth 2 (two) times daily. (Patient taking differently: Take 400 mg by mouth as needed. ), Disp: 60 capsule, Rfl: 3 .  hydrochlorothiazide (HYDRODIURIL) 25 MG tablet, TAKE 1 TABLET BY MOUTH EVERY DAY, Disp: 90 tablet, Rfl: 0 .  losartan (COZAAR) 100 MG tablet, TAKE 1 TABLET (100 MG TOTAL) BY MOUTH DAILY. THIS REPLACES THE COMBO BP MEDICINE, Disp: 90 tablet, Rfl: 1 .  meloxicam (MOBIC) 15 MG tablet, Take 15 mg by mouth daily., Disp: , Rfl:  .  metFORMIN (GLUCOPHAGE-XR) 500 MG 24 hr tablet, TAKE 1 TABLET (500 MG TOTAL) BY MOUTH DAILY WITH LUNCH., Disp: 90 tablet, Rfl: 1 .  metoprolol succinate (TOPROL-XL) 25 MG 24 hr tablet, TAKE 1 TABLET (25 MG TOTAL) BY  MOUTH DAILY., Disp: 90 tablet, Rfl: 1 .  nitroGLYCERIN (NITROSTAT) 0.4 MG SL tablet, Place 1 tablet (0.4 mg total) under the tongue every 5 (five) minutes as needed for chest pain (CP or SOB)., Disp: 30 tablet, Rfl: 12 .  omeprazole (PRILOSEC) 40 MG capsule, Take 40 mg by mouth daily. , Disp: , Rfl:  .  oxyCODONE-acetaminophen (PERCOCET) 5-325 MG tablet, Take 1 tablet by mouth as needed (for pain). , Disp: , Rfl:   Allergies  Allergen Reactions  . Prednisone Nausea And Vomiting  . Sulfa Antibiotics Nausea And Vomiting  . Tramadol Other (See Comments)    PER PATIENT, DOES NOT WORK AT ALL FOR PAIN RELIEF    Review of Systems  Constitutional: Negative for chills, fever and malaise/fatigue.  HENT: Negative for congestion, sinus pain and sore throat.   Eyes: Negative for blurred vision.  Respiratory: Negative for cough and shortness of breath.   Cardiovascular: Negative for chest pain, palpitations and leg swelling.  Gastrointestinal: Negative for abdominal pain, constipation, diarrhea and nausea.  Genitourinary: Negative for dysuria and urgency.  Musculoskeletal: Positive for  falls (tripped 3 weeks ago in the rain when she was walking her dog, no injuries). Negative for joint pain.  Skin: Negative for rash.  Neurological: Negative for dizziness and headaches.  Endo/Heme/Allergies: Negative for polydipsia.  Psychiatric/Behavioral: The patient is not nervous/anxious and does not have insomnia.      No other specific complaints in a complete review of systems (except as listed in HPI above).  Objective  Vitals:   01/28/19 0829  BP: 130/72  Pulse: 68  Resp: 14  Temp: 98 F (36.7 C)  TempSrc: Oral  SpO2: 97%  Weight: 173 lb 8 oz (78.7 kg)  Height: 5\' 2"  (1.575 m)     Body mass index is 31.73 kg/m.  Nursing Note and Vital Signs reviewed.  Physical Exam Vitals signs reviewed.  Constitutional:      Appearance: She is well-developed.  HENT:     Head: Normocephalic and atraumatic.  Neck:     Musculoskeletal: Normal range of motion and neck supple.     Vascular: No carotid bruit.  Cardiovascular:     Heart sounds: Normal heart sounds.  Pulmonary:     Effort: Pulmonary effort is normal.     Breath sounds: Normal breath sounds.  Abdominal:     General: Bowel sounds are normal.     Palpations: Abdomen is soft.     Tenderness: There is no abdominal tenderness.  Musculoskeletal: Normal range of motion.  Skin:    General: Skin is warm and dry.     Capillary Refill: Capillary refill takes less than 2 seconds.  Neurological:     Mental Status: She is alert and oriented to person, place, and time.     GCS: GCS eye subscore is 4. GCS verbal subscore is 5. GCS motor subscore is 6.     Sensory: No sensory deficit.  Psychiatric:        Speech: Speech normal.        Behavior: Behavior normal.        Thought Content: Thought content normal.        Judgment: Judgment normal.        No results found for this or any previous visit (from the past 48 hour(s)).  Assessment & Plan  1. Essential hypertension stable - hydrochlorothiazide  (HYDRODIURIL) 25 MG tablet; Take 1 tablet (25 mg total) by mouth daily.  Dispense: 90 tablet; Refill:  1 - losartan (COZAAR) 100 MG tablet; Take 1 tablet (100 mg total) by mouth daily. This replaces the combo BP medicine  Dispense: 90 tablet; Refill: 1 - metoprolol succinate (TOPROL-XL) 25 MG 24 hr tablet; Take 1 tablet (25 mg total) by mouth daily.  Dispense: 90 tablet; Refill: 1  2. Simple chronic bronchitis (HCC) stable  3. Gastroesophageal reflux disease without esophagitis Avoid triggers   4. Type 2 diabetes mellitus with hyperosmolarity without coma, without long-term current use of insulin (HCC) Elevated A1C will work on diet and follow-up in 3 months.  - POCT HgB A1C - metFORMIN (GLUCOPHAGE-XR) 500 MG 24 hr tablet; Take 1 tablet (500 mg total) by mouth daily with lunch.  Dispense: 90 tablet; Refill: 1  5. Dyslipidemia - atorvastatin (LIPITOR) 20 MG tablet; Take 1 tablet (20 mg total) by mouth at bedtime.  Dispense: 90 tablet; Refill: 1  6. Trigeminal neuralgia of right side of face Surgery in august   7. Screening for breast cancer - MM Digital Screening; Future  8. Anxiety - sertraline (ZOLOFT) 50 MG tablet; Take 1 tablet (50 mg total) by mouth daily.  Dispense: 30 tablet; Refill: 3

## 2019-01-28 NOTE — Patient Instructions (Addendum)
-   Your A1C has gone up just a little bit to 6.6%, we will keep you on your current dose of metformin and follow-up in 3 months to see how it is doing again and see how the anxiety medicine is working.   Foods and drinks to limit include  . fried foods and other foods high in saturated fat and trans fat  . foods high in salt, also called sodium  . sweets, such as baked goods, candy, and ice cream  . beverages with added sugars, such as juice, regular soda, and regular sports or energy drinks  Drink water instead of sweetened beverages. Consider using a sugar substitute in your coffee or tea.   Instead, eat carbohydrates from fruit, vegetables, whole grains, beans, and low-fat or nonfat milk. Choose healthy carbohydrates, such as fruit, vegetables, whole grains, beans, and low-fat milk, as part of your diabetes meal plan.  Please do call to schedule your mammogram; the number to schedule one at either [facility] or [facility] is [phone]

## 2019-02-20 ENCOUNTER — Other Ambulatory Visit: Payer: Self-pay | Admitting: Nurse Practitioner

## 2019-02-20 DIAGNOSIS — F419 Anxiety disorder, unspecified: Secondary | ICD-10-CM

## 2019-03-01 ENCOUNTER — Telehealth: Payer: Self-pay | Admitting: Physician Assistant

## 2019-03-01 NOTE — Progress Notes (Signed)
Cardiology Office Note    Date:  03/02/2019   ID:  Amanda SheenRebecca B Osten, DOB 30-Sep-1955, MRN [id number]  PCP:  Kerman PasseyLada, Melinda P, MD  Cardiologist: Tobias AlexanderKatarina Nelson, MD EPS: None  No chief complaint on file.   History of Present Illness:  Amanda Lyons is a 63 y.o. female  with history of hypertensive urgency, hyperlipidemia, chest pain and shortness of breath with normal cath in 2016, repeat nuclear stress test for chest pain 10/2016 showed no ischemia but hypertensive response and amlodipine 2.5 mg added daily.  Was also given colchicine for possible pericarditis.  2D echo 09/23/2016 normal LVEF 60 to 65% with grade 1 DD.    I saw her 12/2017 for cardiac clearance before undergoing rotator cuff repair. No testing needed at that time.  Patient is here again for preoperative clearance before undergoing brain surgery to place a piece of teflon for trigeminal neuralgia of the right side of her face.She had the surgery 19 yrs ago and is scheduled to [facility] Aug [facility] by Dr. Lilli LightSteven Tatter. Denies chest pain, shortness of breath, dizziness or presyncope. BP has been ok at home 130's/76-78. Under a lot of stress. 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 Walks a lot at work but no regular exercise.    Past Medical History:  Diagnosis Date  . Anginal pain (HCC) 12/2016   chest pain last time one year ago  . Anxiety   . Arthritis    left shoulder  . Asthma   . Chest pain 11/08/2013  . Diabetes mellitus without complication (HCC)   . Dyslipidemia 11/08/2013  . Dysrhythmia    fast  . GERD (gastroesophageal reflux disease)   . History of anxiety    had anxiety attacks  . History of kidney stones   . HNP (herniated nucleus pulposus), lumbar 05/02/2013  . Hypertension   . Left rotator cuff tear 11/24/2017  . Trigeminal neuralgia     Past Surgical History:  Procedure Laterality Date  . ABDOMINAL HYSTERECTOMY    . BACK SURGERY    . BRAIN SURGERY  2000,2001   for  trigeminal neuralgia  . CARDIAC CATHETERIZATION    . gamma knife  1999   for trigeminal neuralgia  . LEFT HEART CATHETERIZATION WITH CORONARY ANGIOGRAM N/A 10/11/2014   Procedure: LEFT HEART CATHETERIZATION WITH CORONARY ANGIOGRAM;  Surgeon: Lesleigh NoeHenry W Smith III, MD;  [facility];  Service: Cardiovascular;  Laterality: N/A;  . LUMBAR LAMINECTOMY/DECOMPRESSION MICRODISCECTOMY  04/19/2003   Right L5-S1 intralaminar laminotomy for excision of herniated disk with the operating microscope  . LUMBAR LAMINECTOMY/DECOMPRESSION MICRODISCECTOMY Right 05/02/2013   Procedure: Right Lumbar Five Sacral One Microdiskectomy;  Surgeon: Temple PaciniHenry A Pool, MD;  Location: [facility] NEURO ORS;  Service: Neurosurgery;  Laterality: Right;  LUMBAR LAMINECTOMY/DECOMPRESSION MICRODISCECTOMY 1 LEVEL  . SHOULDER ARTHROSCOPY WITH OPEN ROTATOR CUFF REPAIR Right 05/28/2016   Procedure: SHOULDER ARTHROSCOPY WITH OPEN ROTATOR CUFF REPAIR;  Surgeon: Deeann SaintHoward Miller, MD;  Location: [facility] ORS;  Service: Orthopedics;  Laterality: Right;  . SHOULDER ARTHROSCOPY WITH OPEN ROTATOR CUFF REPAIR Left 01/06/2018   Procedure: SHOULDER ARTHROSCOPY WITH  ROTATOR CUFF REPAIR;  Surgeon: Deeann SaintMiller, Howard, MD;  Location: [facility] ORS;  Service: Orthopedics;  Laterality: Left;    Current Medications: Current Meds  Medication Sig  . albuterol (PROVENTIL HFA;VENTOLIN HFA) 108 (90 Base) MCG/ACT inhaler Inhale 2 puffs into the lungs every 6 (six) hours as needed for wheezing or shortness of breath.  Marland Kitchen. amLODipine (  NORVASC) 2.5 MG tablet TAKE 1 TABLET BY MOUTH EVERY DAY  . aspirin EC 81 MG EC tablet Take 1 tablet (81 mg total) by mouth daily.  Marland Kitchen. atorvastatin (LIPITOR) 20 MG tablet Take 1 tablet (20 mg total) by mouth at bedtime.  . Cyanocobalamin (VITAMIN B-12 ER PO) Take 500 mg by mouth every other day.   . hydrochlorothiazide (HYDRODIURIL) 25 MG tablet Take 1 tablet (25 mg total) by mouth daily.  Marland Kitchen. losartan (COZAAR) 100 MG tablet Take 1 tablet (100 mg total) by  mouth daily. This replaces the combo BP medicine  . meloxicam (MOBIC) 15 MG tablet Take 15 mg by mouth daily.  . metFORMIN (GLUCOPHAGE-XR) 500 MG 24 hr tablet Take 1 tablet (500 mg total) by mouth daily with lunch.  . metoprolol succinate (TOPROL-XL) 25 MG 24 hr tablet Take 1 tablet (25 mg total) by mouth daily.  Marland Kitchen. omeprazole (PRILOSEC) 40 MG capsule Take 40 mg by mouth daily.   . sertraline (ZOLOFT) 50 MG tablet TAKE 1 TABLET BY MOUTH EVERY DAY     Allergies:   Prednisone, Sulfa antibiotics, and Tramadol   Social History   Socioeconomic History  . Marital status: Married    Spouse name: Not on file  . Number of children: Not on file  . Years of education: Not on file  . Highest education level: Not on file  Occupational History  . Not on file  Social Needs  . Financial resource strain: Not on file  . Food insecurity    Worry: Not on file    Inability: Not on file  . Transportation needs    Medical: Not on file    Non-medical: Not on file  Tobacco Use  . Smoking status: Never Smoker  . Smokeless tobacco: Never Used  Substance and Sexual Activity  . Alcohol use: No  . Drug use: No  . Sexual activity: Yes    Birth control/protection: Post-menopausal  Lifestyle  . Physical activity    Days per week: Not on file    Minutes per session: Not on file  . Stress: Not on file  Relationships  . Social Musicianconnections    Talks on phone: Not on file    Gets together: Not on file    Attends religious service: Not on file    Active member of club or organization: Not on file    Attends meetings of clubs or organizations: Not on file    Relationship status: Not on file  Other Topics Concern  . Not on file  Social History Narrative  . Not on file     Family History:  The patient's family history includes CVA in her father; Cancer in her mother; Diabetes in her father and sister; Heart attack in her maternal grandfather; Heart disease in her father; Heart failure in her father,  maternal grandmother, and mother; Hypertension in her father, sister, and sister; Ovarian cancer in her mother.   ROS:   Please see the history of present illness.    Review of Systems  Constitution: Negative.  HENT: Positive for ear pain.   Eyes: Negative.   Cardiovascular: Negative.   Respiratory: Negative.   Hematologic/Lymphatic: Negative.   Musculoskeletal: Negative.  Negative for joint pain.  Gastrointestinal: Negative.   Genitourinary: Negative.   Neurological: Negative.    All other systems reviewed and are negative.   PHYSICAL EXAM:   VS:  BP (!) 144/78   Pulse 66   Ht 5\' 2"  (1.575 m)  Wt 171 lb (77.6 kg)   BMI 31.28 kg/m   Physical Exam  GEN: Well nourished, well developed, in no acute distress  Neck: no JVD, carotid bruits, or masses Cardiac:RRR; no murmurs, rubs, or gallops  Respiratory:  clear to auscultation bilaterally, normal work of breathing GI: soft, nontender, nondistended, + BS Ext: without cyanosis, clubbing, or edema, Good distal pulses bilaterally Neuro:  Alert and Oriented x 3 Psych: euthymic mood, full affect  Wt Readings from Last 3 Encounters:  03/02/19 171 lb (77.6 kg)  01/28/19 173 lb 8 oz (78.7 kg)  09/18/18 168 lb (76.2 kg)      Studies/Labs Reviewed:   EKG:  EKG is  ordered today.  The ekg ordered today demonstrates NSR with Nonspecific ST changes no change from 11/2017  Recent Labs: 03/30/2018: Brain Natriuretic Peptide 30; Hemoglobin 13.7; Platelets 339; TSH 1.12 07/28/2018: BUN 22; Creat 0.68; Potassium 4.1; Sodium 139 10/06/2018: ALT 46   Lipid Panel    Component Value Date/Time   CHOL 150 10/06/2018 0000   CHOL 173 10/02/2016 1017   TRIG 90 10/06/2018 0000   HDL 58 10/06/2018 0000   HDL 55 10/02/2016 1017   CHOLHDL 2.6 10/06/2018 0000   LDLCALC 75 10/06/2018 0000    Additional studies/ records that were reviewed today include:  2D echo 2/13/2018Study Conclusions   - Left ventricle: The cavity size was at the upper  limits of   normal. Wall thickness was increased in a pattern of mild LVH.   Systolic function was normal. The estimated ejection fraction was   in the range of 60% to 65%. Wall motion was normal; there were no   regional wall motion abnormalities. Doppler parameters are   consistent with abnormal left ventricular relaxation (grade 1   diastolic dysfunction). - Right ventricle: The cavity size was normal. Systolic function   was normal.   Chest CT 1/29/2018IMPRESSION: 1. No evidence of pulmonary embolus. 2. Minimal bibasilar atelectasis noted.  Lungs otherwise clear. 3. Borderline cardiomegaly.     Electronically Signed   By: Roanna RaiderJeffery  Chang M.D.   On: 09/08/2016 23:42   NST 3/2018Study Highlights       Nuclear stress EF: 67%.  Blood pressure demonstrated a hypertensive response to exercise.  There was no ST segment deviation noted during stress.  The study is normal.  This is a low risk study.  The left ventricular ejection fraction is hyperdynamic (>65%).   Normal stress nuclear study with no ischemia or infarction; EF 67 with normal wall motion.         Cardiac cath 2016  IMPRESSIONS:  1. Normal left heart catheterization with normal coronary arteries. 2. Normal left ventricular systolic and diastolic function. EF 60%      ASSESSMENT:    1. Preoperative clearance   2. Chest pain, unspecified type   3. Essential hypertension   4. Dyslipidemia      PLAN:  In order of problems listed above:  Preoperative clearance  before undergoing brain surgery to place a piece of teflon for treatment of trigeminal neuralgia on the right side of her face.She had the surgery 19 yrs ago and is scheduled to [facility] by Dr. Lilli LightSteven Tatter. She has no cardiac complaints.  Normal cardiac catheterization 2016, normal nuclear stress test 2018 normal LVEF.  Revised cardiac risk index is 0.9 and METS are 7.25.  No further cardiac work-up needed prior to  surgery. According to the Revised Cardiac Risk Index (  RCRI), her Perioperative Risk of Major Cardiac Event is (%): 0.9  Her Functional Capacity in METs is: 7.25 according to the [employer] Activity Status Index (DASI).      History of chest pain with normal cardiac catheterization 2016, nuclear stress test 2018 without ischemia normal LVEF.  Follow-up in 1 year.  Essential hypertension blood pressure up a little today but has been controlled at home.  No changes today.    Hyperlipidemia on Lipitor.  LDL 75- 10/06/2018  Medication Adjustments/Labs and Tests Ordered: Current medicines are reviewed at length with the patient today.  Concerns regarding medicines are outlined above.  Medication changes, Labs and Tests ordered today are listed in the Patient Instructions below. There are no Patient Instructions on file for this visit.   Sumner Boast, PA-C  03/02/2019 8:35 AM    [facility] [place], [place], [place]  [address] Phone: [phone]; Fax: [phone]

## 2019-03-01 NOTE — Telephone Encounter (Signed)

## 2019-03-02 ENCOUNTER — Encounter: Payer: Self-pay | Admitting: Physician Assistant

## 2019-03-02 ENCOUNTER — Other Ambulatory Visit: Payer: Self-pay

## 2019-03-02 ENCOUNTER — Ambulatory Visit: Payer: 59 | Admitting: Physician Assistant

## 2019-03-02 VITALS — BP 144/78 | HR 66 | Ht 62.0 in | Wt 171.0 lb

## 2019-03-02 DIAGNOSIS — R079 Chest pain, unspecified: Secondary | ICD-10-CM

## 2019-03-02 DIAGNOSIS — Z01818 Encounter for other preprocedural examination: Secondary | ICD-10-CM

## 2019-03-02 DIAGNOSIS — I10 Essential (primary) hypertension: Secondary | ICD-10-CM

## 2019-03-02 DIAGNOSIS — E785 Hyperlipidemia, unspecified: Secondary | ICD-10-CM | POA: Diagnosis not present

## 2019-03-02 MED ORDER — AMLODIPINE BESYLATE 2.5 MG PO TABS: 2.5000 mg | ORAL_TABLET | Freq: Every day | ORAL | 3 refills | Status: DC

## 2019-03-02 NOTE — Patient Instructions (Signed)
Medication Instructions:   Your physician recommends that you continue on your current medications as directed. Please refer to the Current Medication list given to you today.  If you need a refill on your cardiac medications before your next appointment, please call your pharmacy.   Lab work:  None ordered today  If you have labs (blood work) drawn today and your tests are completely normal, you will receive your results only by: Marland Kitchen MyChart Message (if you have MyChart) OR . A paper copy in the mail If you have any lab test that is abnormal or we need to change your treatment, we will call you to review the results.  Testing/Procedures:  None ordered today  Follow-Up: At [facility], you and your health needs are our priority.  As part of our continuing mission to provide you with exceptional heart care, we have created designated Provider Care Teams.  These Care Teams include your primary Cardiologist (physician) and Advanced Practice Providers (APPs -  Physician Assistants and Nurse Practitioners) who all work together to provide you with the care you need, when you need it. You will need a follow up appointment in 12 months.  Please call our office 2 months in advance to schedule this appointment.  You may see Ena Dawley, MD or one of the following Advanced Practice Providers on your designated Care Team:   [place], PA-C Melina Copa, PA-C . Ermalinda Barrios, PA-C

## 2019-04-15 ENCOUNTER — Telehealth: Payer: Self-pay | Admitting: Family Medicine

## 2019-04-15 NOTE — Telephone Encounter (Signed)
Medication Refill - Medication: losartan (COZAAR) 100 MG tablet Pt stated she requested refill with pharmacy and they have not heard back from office. Pt is going out of town for two weeks tomorrow and is out of medication. Please advise.  Has the patient contacted their pharmacy? Yes.   (Agent: If no, request that the patient contact the pharmacy for the refill.) (Agent: If yes, when and what did the pharmacy advise?)  Preferred Pharmacy (with phone number or street name): [id number] [address], [place] [phone] (Phone) [phone] (Fax)     Agent: Please be advised that RX refills may take up to 3 business days. We ask that you follow-up with your pharmacy.

## 2019-04-15 NOTE — Telephone Encounter (Signed)
No refills needed at this time, left pt voicemail

## 2019-06-03 IMAGING — US US HEPATIC LIVER DOPPLER
1 series · 13 of 25 positions shown · non-contrast
Comparison: None.

CLINICAL DATA: Elevated alkaline phosphatase and ALT levels.

EXAM:
DUPLEX ULTRASOUND OF LIVER
TECHNIQUE: Color and duplex Doppler ultrasound was performed to evaluate the
hepatic in-flow and out-flow vessels.

[Series 1: us hepatic liver doppler · 0.26mm/px · 13 of 53 slices shown]
[im 1/53]
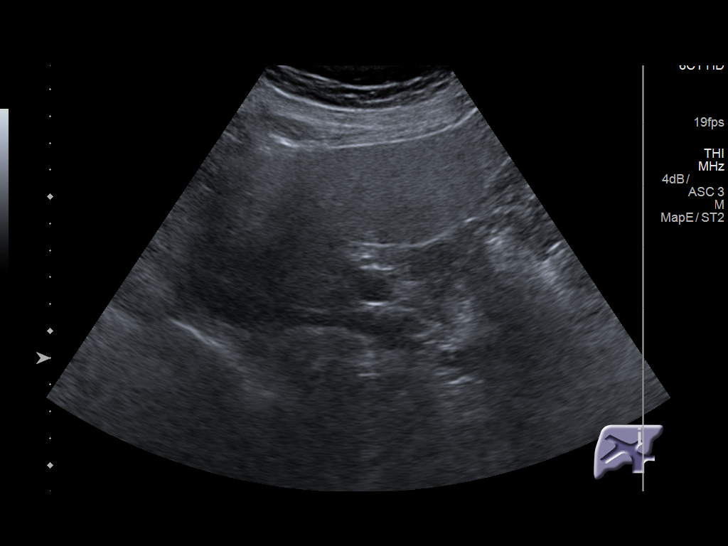
[im 5/53]
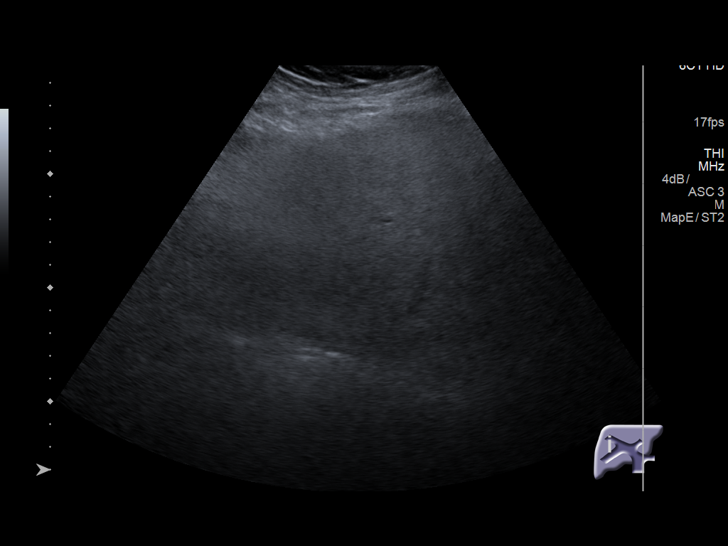
[im 9/53]
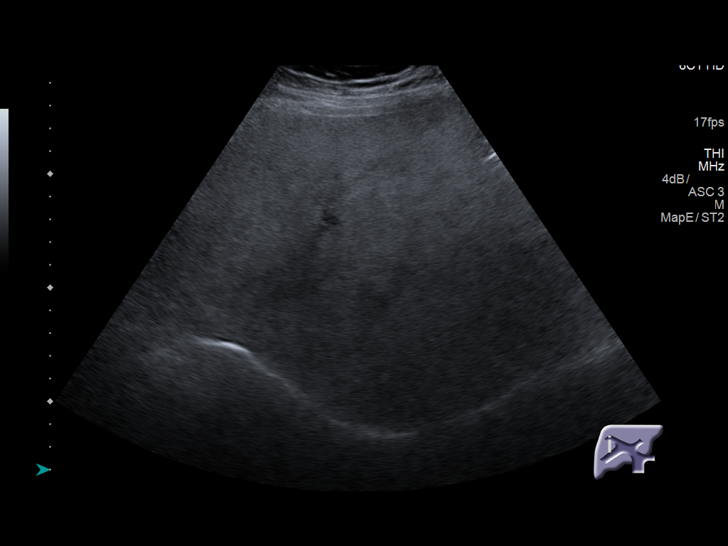
[im 14/53]
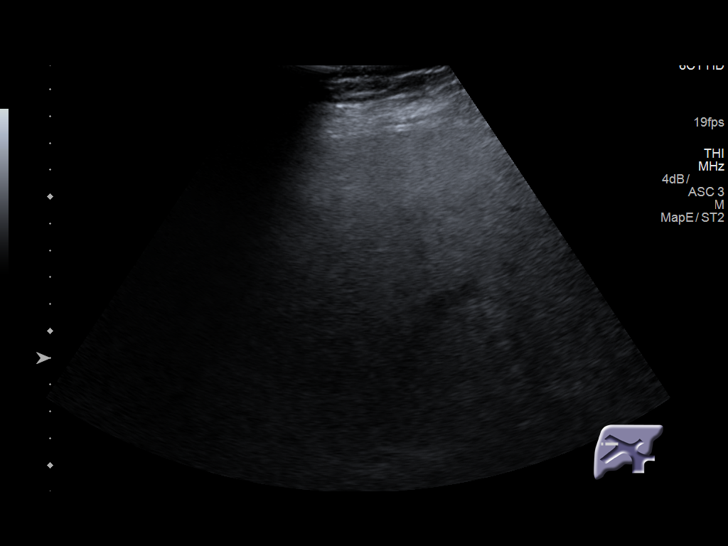
[im 18/53]
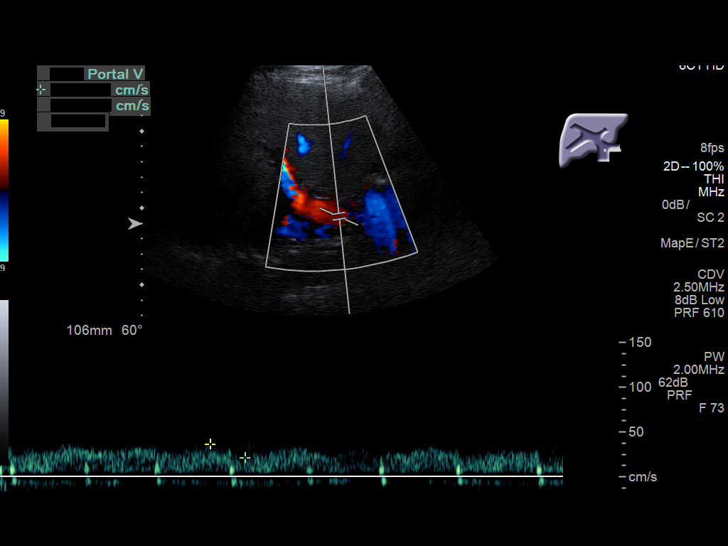
[im 22/53]
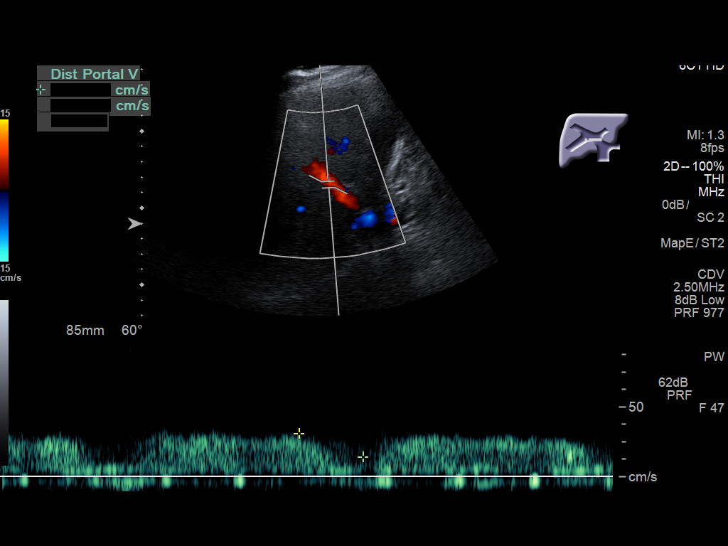
[im 27/53]
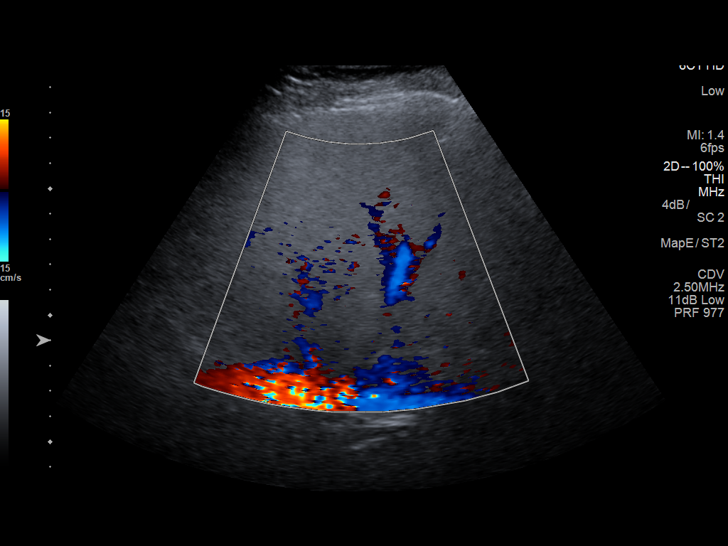
[im 31/53]
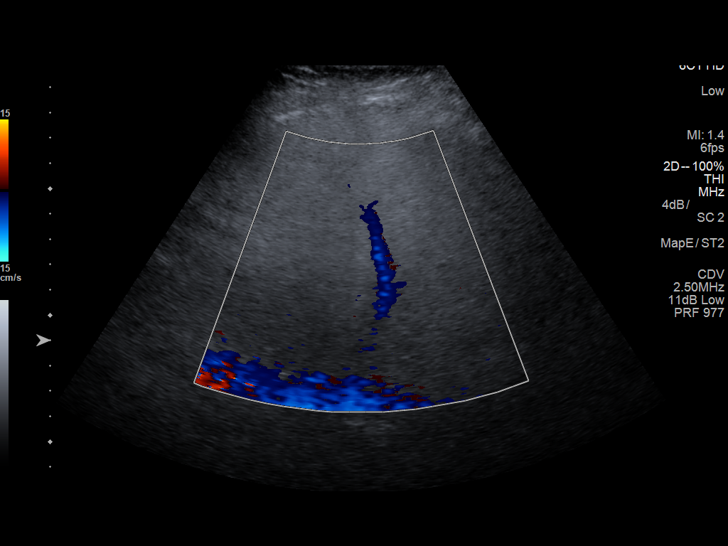
[im 35/53]
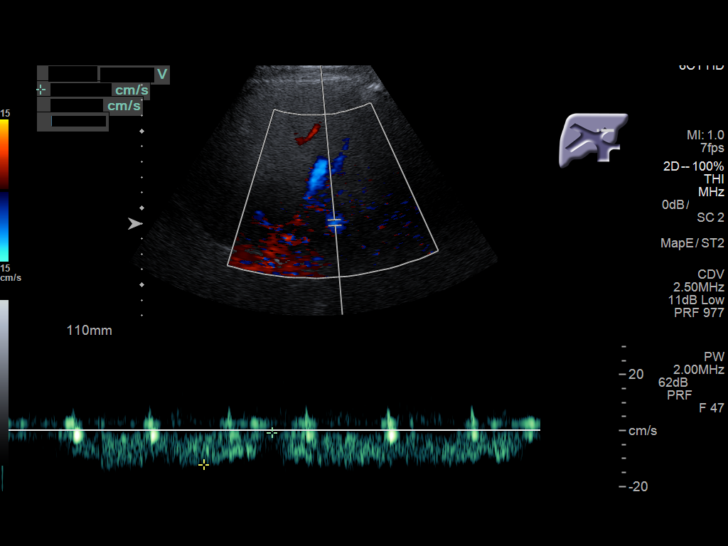
[im 40/53]
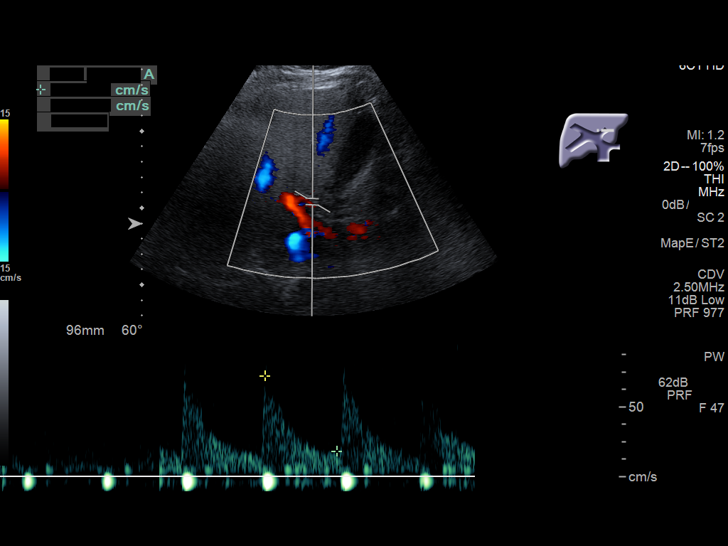
[im 44/53]
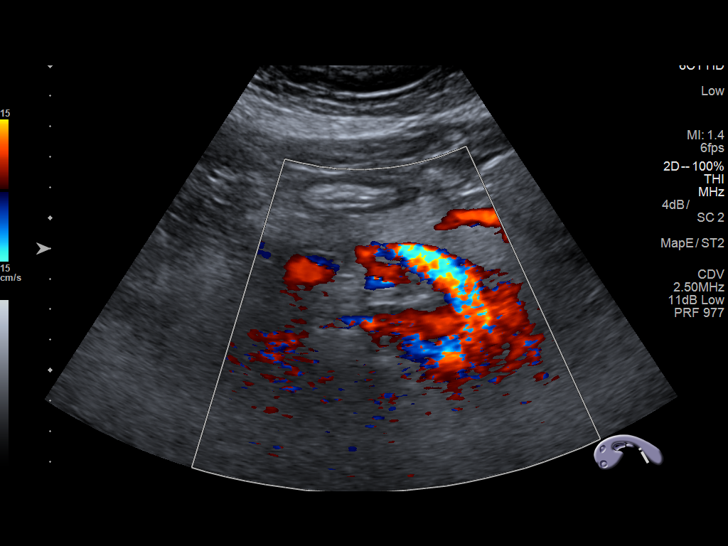
[im 48/53]
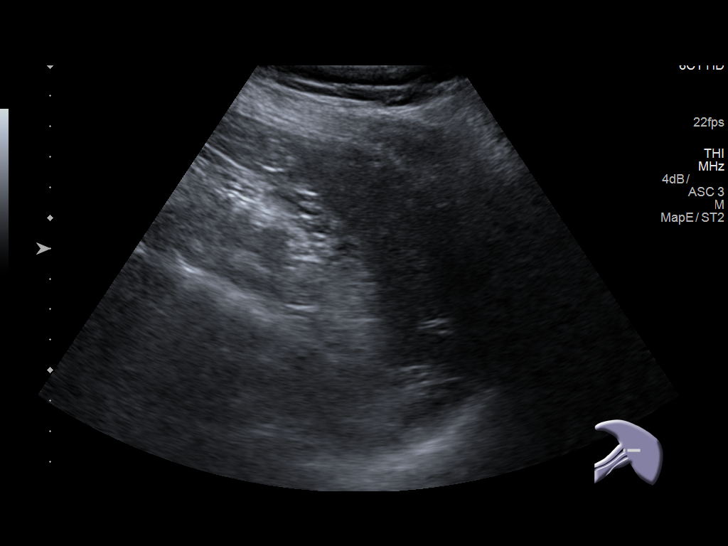
[im 53/53]
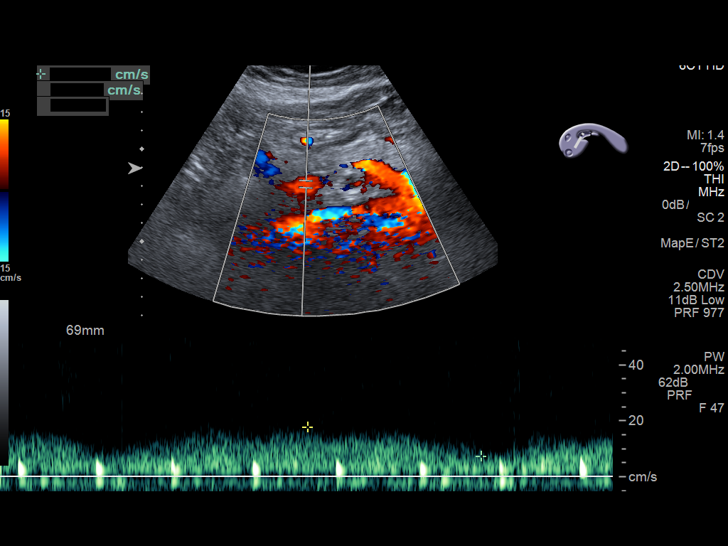

[13 of 25 positions shown; findings below may reference images not displayed]

FINDINGS: Portal Vein Velocities

Main:  37 cm/sec

Right:  12 cm/sec

Left:  26 cm/sec

Direction of portal vein flow is towards the liver/hepatopetal. No
evidence of portal vein thrombus or abnormal waveforms.

Hepatic Vein Velocities

Right:  23 cm/sec

Middle:  16 cm/sec

Left:  12 cm/sec

Normal hepatic venous waveforms without evidence of Laffet
disease or abnormal pulsatility.

Hepatic Artery Velocity:  72 cm/sec

Normal hepatic artery waveform.

Splenic Vein Velocity:  12 cm/sec

Varices: None visualized.

Ascites: None visualized.

The liver demonstrates coarse echotexture and increased
echogenicity, likely reflecting diffuse steatosis. No overt
cirrhotic contour abnormalities or focal lesions are identified.
There is no evidence of intrahepatic biliary ductal dilatation. The
spleen is nonenlarged.
IMPRESSION: 1. Findings consistent with diffuse hepatic steatosis.
2. No evidence of portal vein thrombus or significant portal
hypertension by duplex ultrasound.
3. No evidence of hepatic Laffet disease.

## 2019-10-06 ENCOUNTER — Other Ambulatory Visit: Payer: Self-pay

## 2019-10-06 DIAGNOSIS — E1100 Type 2 diabetes mellitus with hyperosmolarity without nonketotic hyperglycemic-hyperosmolar coma (NKHHC): Secondary | ICD-10-CM

## 2019-10-06 DIAGNOSIS — I10 Essential (primary) hypertension: Secondary | ICD-10-CM

## 2019-10-06 NOTE — Telephone Encounter (Signed)
error 

## 2019-11-17 ENCOUNTER — Ambulatory Visit: Payer: 59 | Attending: Internal Medicine

## 2019-11-17 DIAGNOSIS — Z23 Encounter for immunization: Secondary | ICD-10-CM

## 2019-11-17 NOTE — Progress Notes (Signed)
   Covid-19 Vaccination Clinic  Name:  Amanda Lyons    MRN: [id number] DOB: [birth year]  11/17/2019  [name] was observed post Covid-19 immunization for 15 minutes without incident. She was provided with Vaccine Information Sheet and instruction to access the V-Safe system.   [name] was instructed to call 911 with any severe reactions post vaccine: Marland Kitchen Difficulty breathing  . Swelling of face and throat  . A fast heartbeat  . A bad rash all over body  . Dizziness and weakness   Immunizations Administered    Name Date Dose VIS Date Route   Pfizer COVID-19 Vaccine 11/17/2019  8:33 AM 0.3 mL 07/22/2019 Intramuscular   Manufacturer: [employer], [employer]   Lot: [id number]   NDC: [address]-3

## 2019-12-14 ENCOUNTER — Ambulatory Visit: Payer: 59

## 2019-12-21 ENCOUNTER — Ambulatory Visit: Payer: 59 | Attending: Internal Medicine

## 2019-12-21 DIAGNOSIS — Z23 Encounter for immunization: Secondary | ICD-10-CM

## 2019-12-21 NOTE — Progress Notes (Signed)
   Covid-19 Vaccination Clinic  Name:  Amanda Lyons    MRN: [id number] DOB: 29-Aug-1955  12/21/2019  [name] was observed post Covid-19 immunization for 15 minutes without incident. She was provided with Vaccine Information Sheet and instruction to access the V-Safe system.   [name] was instructed to call 911 with any severe reactions post vaccine: Marland Kitchen Difficulty breathing  . Swelling of face and throat  . A fast heartbeat  . A bad rash all over body  . Dizziness and weakness   Immunizations Administered    Name Date Dose VIS Date Route   Pfizer COVID-19 Vaccine 12/21/2019  8:36 AM 0.3 mL 10/05/2018 Intramuscular   Manufacturer: [employer], [employer]   Lot: [id number]   NDC: [address]-6

## 2020-02-24 ENCOUNTER — Other Ambulatory Visit: Payer: Self-pay | Admitting: Neurosurgery

## 2020-02-24 ENCOUNTER — Telehealth: Payer: Self-pay | Admitting: Nurse Practitioner

## 2020-02-24 DIAGNOSIS — M4310 Spondylolisthesis, site unspecified: Secondary | ICD-10-CM

## 2020-02-24 NOTE — Telephone Encounter (Signed)
Phone call to patient to verify medication list and allergies for myelogram procedure. Pt instructed to hold zoloft and tramadol for 48hrs prior to myelogram appointment time. Pt verbalized understanding. Pre and post procedure instructions reviewed with pt.

## 2020-03-07 ENCOUNTER — Ambulatory Visit
Admission: RE | Admit: 2020-03-07 | Discharge: 2020-03-07 | Disposition: A | Discharge status: Home / Self Care | Payer: 59 | Source: Ambulatory Visit | Attending: Neurosurgery | Admitting: Neurosurgery

## 2020-03-07 VITALS — BP 151/80 | HR 61

## 2020-03-07 DIAGNOSIS — M5126 Other intervertebral disc displacement, lumbar region: Secondary | ICD-10-CM

## 2020-03-07 DIAGNOSIS — M4310 Spondylolisthesis, site unspecified: Secondary | ICD-10-CM

## 2020-03-07 MED ORDER — ONDANSETRON HCL 4 MG/2ML IJ SOLN
4.0000 mg | Freq: Once | INTRAMUSCULAR | Status: AC
  Administered 2020-03-07: 4 mg via INTRAMUSCULAR

## 2020-03-07 MED ORDER — DIAZEPAM 5 MG PO TABS
10.0000 mg | ORAL_TABLET | Freq: Once | ORAL | Status: AC
  Administered 2020-03-07: 10 mg via ORAL

## 2020-03-07 MED ORDER — MEPERIDINE HCL 100 MG/ML IJ SOLN
50.0000 mg | Freq: Once | INTRAMUSCULAR | Status: AC
  Administered 2020-03-07: 50 mg via INTRAMUSCULAR

## 2020-03-07 MED ORDER — IOPAMIDOL (ISOVUE-M 200) INJECTION 41%
20.0000 mL | Freq: Once | INTRAMUSCULAR | Status: AC
  Administered 2020-03-07: 20 mL via INTRATHECAL

## 2020-03-07 NOTE — Progress Notes (Signed)
Pt reports she has not taken her Tramadol or Sertraline in the past 48 hours.

## 2020-03-07 NOTE — Discharge Instructions (Signed)
Myelogram Discharge Instructions  1. Go home and rest quietly for the next 24 hours.  It is important to lie flat for the next 24 hours.  Get up only to go to the restroom.  You may lie in the bed or on a couch on your back, your stomach, your left side or your right side.  You may have one pillow under your head.  You may have pillows between your knees while you are on your side or under your knees while you are on your back.  2. DO NOT drive today.  Recline the seat as far back as it will go, while still wearing your seat belt, on the way home.  3. You may get up to go to the bathroom as needed.  You may sit up for 10 minutes to eat.  You may resume your normal diet and medications unless otherwise indicated.  Drink lots of extra fluids today and tomorrow.  4. The incidence of headache, nausea, or vomiting is about 5% (one in 20 patients).  If you develop a headache, lie flat and drink plenty of fluids until the headache goes away.  Caffeinated beverages may be helpful.  If you develop severe nausea and vomiting or a headache that does not go away with flat bed rest, call [phone].  5. You may resume normal activities after your 24 hours of bed rest is over; however, do not exert yourself strongly or do any heavy lifting tomorrow. If when you get up you have a headache when standing, go back to bed and force fluids for another 24 hours.  6. Call your physician for a follow-up appointment.  The results of your myelogram will be sent directly to your physician by the following day.  7. If you have any questions or if complications develop after you arrive home, please call [phone].  Discharge instructions have been explained to the patient.  The patient, or the person responsible for the patient, fully understands these instructions  YOU MAY RESUME YOUR TRAMADOL AND SERTRALINE TOMORROW 03/08/2020 AT 9:30 AM

## 2020-03-21 ENCOUNTER — Other Ambulatory Visit: Payer: Self-pay | Admitting: Physician Assistant

## 2020-04-15 ENCOUNTER — Other Ambulatory Visit: Payer: Self-pay | Admitting: Cardiology

## 2020-05-07 ENCOUNTER — Other Ambulatory Visit: Payer: Self-pay | Admitting: Cardiology

## 2021-10-30 ENCOUNTER — Emergency Department
Admission: EM | Admit: 2021-10-30 | Discharge: 2021-10-30 | Disposition: A | Discharge status: Home / Self Care | Payer: 59 | Attending: Emergency Medicine | Admitting: Emergency Medicine

## 2021-10-30 ENCOUNTER — Emergency Department: Payer: 59

## 2021-10-30 ENCOUNTER — Other Ambulatory Visit: Payer: Self-pay

## 2021-10-30 ENCOUNTER — Encounter: Payer: Self-pay | Admitting: Emergency Medicine

## 2021-10-30 DIAGNOSIS — I10 Essential (primary) hypertension: Secondary | ICD-10-CM | POA: Diagnosis not present

## 2021-10-30 DIAGNOSIS — E119 Type 2 diabetes mellitus without complications: Secondary | ICD-10-CM | POA: Insufficient documentation

## 2021-10-30 DIAGNOSIS — R1031 Right lower quadrant pain: Secondary | ICD-10-CM | POA: Diagnosis not present

## 2021-10-30 DIAGNOSIS — R197 Diarrhea, unspecified: Secondary | ICD-10-CM

## 2021-10-30 DIAGNOSIS — R110 Nausea: Secondary | ICD-10-CM

## 2021-10-30 LAB — COMPREHENSIVE METABOLIC PANEL
ALT: 61 U/L — ABNORMAL HIGH (ref 0–44)
AST: 42 U/L — ABNORMAL HIGH (ref 15–41)
Albumin: 4.1 g/dL (ref 3.5–5.0)
Alkaline Phosphatase: 107 U/L (ref 38–126)
Anion gap: 9 (ref 5–15)
BUN: 16 mg/dL (ref 8–23)
CO2: 26 mmol/L (ref 22–32)
Calcium: 10 mg/dL (ref 8.9–10.3)
Chloride: 102 mmol/L (ref 98–111)
Creatinine, Ser: 0.76 mg/dL (ref 0.44–1.00)
GFR, Estimated: >60 mL/min (ref >60)
Glucose, Bld: 150 mg/dL — ABNORMAL HIGH (ref 70–99)
Potassium: 3.5 mmol/L (ref 3.5–5.1)
Sodium: 137 mmol/L (ref 135–145)
Total Bilirubin: 0.5 mg/dL (ref 0.3–1.2)
Total Protein: 7.5 g/dL (ref 6.5–8.1)

## 2021-10-30 LAB — CBC
HCT: 40.6 % (ref 36.0–46.0)
Hemoglobin: 13.1 g/dL (ref 12.0–15.0)
MCH: 28.4 pg (ref 26.0–34.0)
MCHC: 32.3 g/dL (ref 30.0–36.0)
MCV: 87.9 fL (ref 80.0–100.0)
Platelets: 305 10*3/uL (ref 150–400)
RBC: 4.62 MIL/uL (ref 3.87–5.11)
RDW: 13 % (ref 11.5–15.5)
WBC: 5.9 10*3/uL (ref 4.0–10.5)
nRBC: 0 % (ref 0.0–0.2)

## 2021-10-30 LAB — URINALYSIS, ROUTINE W REFLEX MICROSCOPIC
Bilirubin Urine: NEGATIVE
Glucose, UA: NEGATIVE mg/dL
Hgb urine dipstick: NEGATIVE
Ketones, ur: NEGATIVE mg/dL
Leukocytes,Ua: NEGATIVE
Nitrite: NEGATIVE
Protein, ur: NEGATIVE mg/dL
Specific Gravity, Urine: 1.012 (ref 1.005–1.030)
pH: 6 (ref 5.0–8.0)

## 2021-10-30 LAB — LIPASE, BLOOD: Lipase: 34 U/L (ref 11–51)

## 2021-10-30 MED ORDER — ONDANSETRON 4 MG PO TBDP: 4.0000 mg | ORAL_TABLET | Freq: Three times a day (TID) | ORAL | 0 refills | Status: AC | PRN

## 2021-10-30 NOTE — ED Provider Notes (Signed)
? ?[facility] ?Provider Note ? ? ? Event Date/Time  ? First MD Initiated Contact with Patient 10/30/21 1111   ?  (approximate) ? ? ?History  ? ?Abdominal Pain and Diarrhea ? ? ?HPI ? ?Amanda Lyons is a 66 y.o. female who presents to the ED for evaluation of Abdominal Pain and Diarrhea ?  ?I reviewed PCP visit from 06/2021.  History of HTN, DM, depression.  ? ?Patient presents to the ED for evaluation of RLQ abdominal pain in the setting of diarrhea.  She reports diarrhea started last night, perhaps 4-5 episodes of watery diarrhea.  After this started, she reports developing RLQ abdominal pain.  She reports concern for appendicitis.  Reports nausea without emesis.  Denies fevers or dysuria.  Reports eating a chicken biscuit this morning without any worsening of her symptoms. ? ?Physical Exam  ? ?Triage Vital Signs: ?ED Triage Vitals [10/30/21 1103]  ?Enc Vitals Group  ?   BP (!) 147/75  ?   Pulse Rate 69  ?   Resp 17  ?   Temp 98.1 ?F (36.7 ?C)  ?   Temp Source Oral  ?   SpO2 98 %  ?   Weight 171 lb 1.2 oz (77.6 kg)  ?   Height 5\' 2"  (1.575 m)  ?   Head Circumference   ?   Peak Flow   ?   Pain Score 4  ?   Pain Loc   ?   Pain Edu?   ?   Excl. in GC?   ? ? ?Most recent vital signs: ?Vitals:  ? 10/30/21 1400 10/30/21 1426  ?BP: (!) 144/74 140/87  ?Pulse: 75 71  ?Resp: 18 17  ?Temp: 98.8 ?F (37.1 ?C) 98.5 ?F (36.9 ?C)  ?SpO2: 99% 98%  ? ? ?General: Awake, no distress.  ?CV:  Good peripheral perfusion.  ?Resp:  Normal effort.  ?Abd:  No distention.  RLQ tenderness with some voluntary guarding with deeper palpation.  Rovsing's positive.  No peritoneal features.  Benign upper abdomen. ?MSK:  No deformity noted.  ?Neuro:  No focal deficits appreciated. ?Other:   ? ? ?ED Results / Procedures / Treatments  ? ?Labs ?(all labs ordered are listed, but only abnormal results are displayed) ?Labs Reviewed  ?COMPREHENSIVE METABOLIC PANEL - Abnormal; Notable for the following components:  ?    Result Value  ?  Glucose, Bld 150 (*)   ? AST 42 (*)   ? ALT 61 (*)   ? All other components within normal limits  ?URINALYSIS, ROUTINE W REFLEX MICROSCOPIC - Abnormal; Notable for the following components:  ? Color, Urine YELLOW (*)   ? APPearance CLEAR (*)   ? All other components within normal limits  ?LIPASE, BLOOD  ?CBC  ? ? ?EKG ? ? ?RADIOLOGY ?CT abdomen/pelvis reviewed by me without evidence of acute intra-abdominal pathology. ? ?Official radiology report(s): ?CT ABDOMEN PELVIS WO CONTRAST ? ?Result Date: 10/30/2021 ?CLINICAL DATA:  Pain right lower quadrant, diarrhea EXAM: CT ABDOMEN AND PELVIS WITHOUT CONTRAST TECHNIQUE: Multidetector CT imaging of the abdomen and pelvis was performed following the standard protocol without IV contrast. RADIATION DOSE REDUCTION: This exam was performed according to the departmental dose-optimization program which includes automated exposure control, adjustment of the mA and/or kV according to patient size and/or use of iterative reconstruction technique. COMPARISON:  01/26/2007 FINDINGS: Lower chest: Unremarkable. Hepatobiliary: There is decreased density in the liver suggesting fatty infiltration. Liver measures 16 cm in length. There  is no dilation of bile ducts. Gallbladder is unremarkable. Pancreas: No focal abnormality is seen. Spleen: Unremarkable. Adrenals/Urinary Tract: Adrenals are not enlarged. There is 2 mm calculus in the upper pole of right kidney. There is no hydronephrosis. Ureters not dilated. There is cortical thinning in the lateral margin of left kidney urinary bladder is unremarkable. Stomach/Bowel: Small hiatal hernia is seen. Stomach is not distended. Small bowel loops are unremarkable. Appendix is not dilated. Scattered diverticula are seen in the colon without signs of focal diverticulitis. Vascular/Lymphatic: There are scattered arterial calcifications. Reproductive: Uterus is not seen. Other: There is no ascites or pneumoperitoneum. Bilateral inguinal hernias  containing fat are noted. Musculoskeletal: There is surgical fusion at L4-L5 level. IMPRESSION: There is no evidence of intestinal obstruction or pneumoperitoneum. There is no hydronephrosis. Appendix is not dilated. Fatty liver. Small hiatal hernia. Diverticulosis of colon without signs of focal diverticulitis. Other findings as described in the body of the report. Electronically Signed   By: Ernie Avena M.D.   On: 10/30/2021 13:41   ? ?PROCEDURES and INTERVENTIONS: ? ?Procedures ? ?Medications - No data to display ? ? ?IMPRESSION / MDM / ASSESSMENT AND PLAN / ED COURSE  ?I reviewed the triage vital signs and the nursing notes. ? ?66 year old female presents to the ED with mild RLQ abdominal pain, nausea and diarrhea without evidence of acute pathology and suitable for outpatient management.  Normal vitals on room air.  Reassuring examination.  She has no peritoneal features or guarding.  Mild tenderness to the RLQ is present.  Otherwise benign abdomen.  Blood work is reassuring with normal CBC, CMP, lipase.  Urine without infectious features or hematuria.  CT obtained without evidence of acute appendicitis, colitis or diverticulitis.  She is tolerating p.o. intake and feels well here in the ED.  We will discharge with Zofran and return precautions. ? ?Clinical Course as of 10/30/21 1546  ?Wed Oct 30, 2021  ?1417 Reassessed.  Patient reports feeling okay.  We discussed reassuring CT. we discussed possible etiologies of her pain. [DS]  ?  ?Clinical Course User Index ?[DS] Delton Prairie, MD  ? ? ? ?FINAL CLINICAL IMPRESSION(S) / ED DIAGNOSES  ? ?Final diagnoses:  ?Diarrhea, unspecified type  ?Nausea  ? ? ? ?Rx / DC Orders  ? ?ED Discharge Orders   ? ?      Ordered  ?  ondansetron (ZOFRAN-ODT) 4 MG disintegrating tablet  Every 8 hours PRN       ? 10/30/21 1419  ? ?  ?  ? ?  ? ? ? ?Note:  This document was prepared using Dragon voice recognition software and may include unintentional dictation errors. ?  ?Delton Prairie, MD ?10/30/21 1547 ? ?

## 2021-10-30 NOTE — ED Triage Notes (Signed)
Pt comes into the ED via POV c/o RLQ abd pain that started a couple days ago.  Pt described the pain as intermittent sharp pain and constant dull pain.  Pt also is now having diarrhea with the pain as well.  PT in NAD at this time with even and unlabored respirations.  ?

## 2023-02-03 ENCOUNTER — Other Ambulatory Visit: Payer: Self-pay | Admitting: Internal Medicine

## 2023-02-03 DIAGNOSIS — R222 Localized swelling, mass and lump, trunk: Secondary | ICD-10-CM

## 2023-02-10 ENCOUNTER — Ambulatory Visit: Payer: 59

## 2023-07-23 ENCOUNTER — Ambulatory Visit: Payer: Medicare Other | Attending: Internal Medicine | Admitting: Internal Medicine

## 2023-07-23 ENCOUNTER — Encounter: Payer: Self-pay | Admitting: Internal Medicine

## 2023-07-23 VITALS — BP 150/74 | HR 63 | Ht 62.0 in | Wt 174.1 lb

## 2023-07-23 DIAGNOSIS — R072 Precordial pain: Secondary | ICD-10-CM

## 2023-07-23 DIAGNOSIS — R002 Palpitations: Secondary | ICD-10-CM | POA: Diagnosis not present

## 2023-07-23 DIAGNOSIS — E785 Hyperlipidemia, unspecified: Secondary | ICD-10-CM

## 2023-07-23 DIAGNOSIS — I10 Essential (primary) hypertension: Secondary | ICD-10-CM

## 2023-07-23 DIAGNOSIS — Z79899 Other long term (current) drug therapy: Secondary | ICD-10-CM

## 2023-07-23 DIAGNOSIS — R0602 Shortness of breath: Secondary | ICD-10-CM | POA: Diagnosis not present

## 2023-07-23 DIAGNOSIS — E1169 Type 2 diabetes mellitus with other specified complication: Secondary | ICD-10-CM

## 2023-07-23 MED ORDER — METOPROLOL TARTRATE 50 MG PO TABS
ORAL_TABLET | ORAL | 0 refills | Status: AC
Start: 2023-07-23 — End: ?

## 2023-07-23 NOTE — Progress Notes (Signed)
Cardiology Office Note:  .   Date:  07/23/2023  ID:  Amanda Lyons, DOB 10-Sep-1955, MRN [id number] PCP: Danella Penton, MD   HeartCare Providers Cardiologist:  New (previously followed by Dr. Delton See)  History of Present Illness: .   Amanda Lyons is a 67 y.o. female history of hypertension, hyperlipidemia, type 2 diabetes mellitus, and asthma, who presents as a self-referral for evaluation of chest pain, palpitations, and shortness of breath.  She is previously followed in our practice by Dr. Delton See, having last been seen in 2020.  Previous workup has included normal cath in 2016 and normal MPI in 2018.  Today, Amanda Lyons reports that over the last months she has experienced episodic palpitations as well as new exertional dyspnea and generalized fatigue.  Palpitations can last anywhere from 15 minutes to all day.  She wonders if they could be related to recent return of trigeminal neuralgia.  She had been started on oxcarbazepine by Dr. Angelyn Punt about 3 weeks ago but had to stop this medicine after a week due to side effects.  She also notes an episode of chest pain that occurred a few days ago.  She describes it as a stabbing sensation in the center of her chest that did not radiate.  It began at rest and lasted about an hour.  She called 911 and was evaluated by EMS.  They told her that her EKG looked normal, and Amanda Lyons declined transfer to the ED for further evaluation.  She notes that her heart had "been beating funny" before onset of the chest pain.  Her chest pain, palpitations, and dyspnea are similar to symptoms she experienced in the past, including when she underwent evaluation by Dr. Delton See.  ROS: See HPI  Studies Reviewed: Marland Kitchen   EKG Interpretation Date/Time:  Thursday July 23 2023 08:22:18 EST Ventricular Rate:  63 PR Interval:  134 QRS Duration:  80 QT Interval:  394 QTC Calculation: 403 R Axis:   19  Text Interpretation: Normal sinus rhythm Normal ECG When compared  with ECG of 02-Mar-2019 Nonspecific ST and T wave abnormality is no longer Present Confirmed by Yvonne Kendall [phone]) on 07/23/2023 12:49:54 PM    Exercise MPI (10/10/2016): Low risk study without evidence of ischemia or scar.  LVEF greater than 65%.  Hypertensive blood pressure response noted.  TTE (09/23/2016): Upper normal LV size with mild LVH.  LVEF 60 open 65% with normal wall motion.  Grade 1 diastolic dysfunction.  Normal RV size and function.  No significant valvular abnormalities.  Risk Assessment/Calculations:     HYPERTENSION CONTROL Vitals:   07/23/23 0808 07/23/23 0823  BP: (!) 144/70 (!) 150/74    The patient's blood pressure is elevated above target today.  In order to address the patient's elevated BP: Blood pressure will be monitored at home to determine if medication changes need to be made.          Physical Exam:   VS:  BP (!) 150/74 (BP Location: Right Arm, Patient Position: Sitting, Cuff Size: Normal)   Pulse 63   Ht 5\' 2"  (1.575 m)   Wt 174 lb 2 oz (79 kg)   SpO2 98%   BMI 31.85 kg/m    Wt Readings from Last 3 Encounters:  07/23/23 174 lb 2 oz (79 kg)  10/30/21 171 lb 1.2 oz (77.6 kg)  03/02/19 171 lb (77.6 kg)    General:  NAD. Neck: No JVD or HJR. Lungs: Clear to auscultation bilaterally  without wheezes or crackles. Heart: Regular rate and rhythm without murmurs, rubs, or gallops. Abdomen: Soft, nontender, nondistended. Extremities: No lower extremity edema.  ASSESSMENT AND PLAN: .    Chest pain, shortness of breath, and fatigue: Amanda Lyons reports a single episode of chest pain that occurred at rest and resolved on its own after about an hour.  She was evaluated by EMS and told that her EKG was normal.  She declined ED evaluation.  She has not had any recurrence of this pain, though she occasionally still has exertional dyspnea and a vague ache in the chest.  Symptoms are reminiscent of what she experienced several years ago when she underwent  catheterization and subsequent echo and MPI.  Workup at that time was normal.  We have discussed further evaluation options and have agreed to obtain an echocardiogram and coronary CTA.  I asked Amanda Lyons to call 911 again if she has recurrent severe symptoms that do not resolve promptly.  Continue current medications including aspirin, rosuvastatin, metoprolol succinate, and amlodipine.  Will check a CBC, CMP, BNP, and TSH today.  Palpitations: Present for about a month.  EKG today shows normal sinus rhythm.  Continue current dose of metoprolol succinate.  If echo and CTA are unrevealing, we will consider 14-day event monitor (ZIO XT) for further assessment.  Check CMP and magnesium levels today as well as TSH.  Hypertension: Blood pressure suboptimally controlled today.  We have agreed to defer medication changes at this time pending aforementioned testing.  She thinks that her recrudescence of trigeminal neuralgia may be contributing to her elevated blood pressures.  I encouraged her to minimize her sodium intake.  Hyperlipidemia associated with type 2 diabetes mellitus: Absence of established ASCVD, LDL of 79 on last check in June is reasonable.  Continue low-dose rosuvastatin.  If coronary CTA demonstrates ASCVD, escalation of statin therapy will need to be considered to target an LDL less than 70.    Dispo: Return to clinic in 2 months.  Signed, Yvonne Kendall, MD

## 2023-07-23 NOTE — Patient Instructions (Addendum)
Medication Instructions:  Your physician recommends that you continue on your current medications as directed. Please refer to the Current Medication list given to you today.   *If you need a refill on your cardiac medications before your next appointment, please call your pharmacy*   Lab Work: Your provider would like for you to have following labs drawn today (CBC, CMP, TSH, BNP, Mg).     Testing/Procedures: Your physician has requested that you have an echocardiogram. Echocardiography is a painless test that uses sound waves to create images of your heart. It provides your doctor with information about the size and shape of your heart and how well your heart's chambers and valves are working.   You may receive an ultrasound enhancing agent through an IV if needed to better visualize your heart during the echo. This procedure takes approximately one hour.  There are no restrictions for this procedure.  This will take place at [address] [facility] [address], [place] [address]  Please note: We ask at that you not bring children with you during ultrasound (echo/ vascular) testing. Due to room size and safety concerns, children are not allowed in the ultrasound rooms during exams. Our front office staff cannot provide observation of children in our lobby area while testing is being conducted. An adult accompanying a patient to their appointment will only be allowed in the ultrasound room at the discretion of the ultrasound technician under special circumstances. We apologize for any inconvenience.   Cardiac CT Angiography (CTA), is a special type of CT scan that uses a computer to produce multi-dimensional views of major blood vessels throughout the body. In CT angiography, a contrast material is injected through an IV to help visualize the blood vessels  Please see instructions below   Follow-Up: At [facility], you and your health needs are our priority.  As part  of our continuing mission to provide you with exceptional heart care, we have created designated Provider Care Teams.  These Care Teams include your primary Cardiologist (physician) and Advanced Practice Providers (APPs -  Physician Assistants and Nurse Practitioners) who all work together to provide you with the care you need, when you need it.  We recommend signing up for the patient portal called "MyChart".  Sign up information is provided on this After Visit Summary.  MyChart is used to connect with patients for Virtual Visits (Telemedicine).  Patients are able to view lab/test results, encounter notes, upcoming appointments, etc.  Non-urgent messages can be sent to your provider as well.   To learn more about what you can do with MyChart, go to [email or web address].    Your next appointment:   2 month(s)  Provider:   You may see Yvonne Kendall, MD or one of the following Advanced Practice Providers on your designated Care Team:   Nicolasa Ducking, NP Eula Listen, PA-C Cadence Fransico Michael, PA-C Charlsie Quest, NP Carlos Levering, NP      Your cardiac CT will be scheduled at one of the below locations:   [facility] [address] [place], [place] [address] [phone]  OR   [facility] [address] [place], [place] [address] [phone]  If scheduled at [facility], please arrive at the [facility] and Children's Entrance (Entrance C2) of [facility] 30 minutes prior to test start time. You can use the FREE valet parking offered at entrance C (encouraged to control the heart rate for the  test)  Proceed to the [facility] Radiology Department (first floor) to check-in and test prep.  All radiology patients and guests should use entrance C2 at [facility], accessed from [facility], even though the hospital's physical address listed is [address]    If  scheduled at [facility] or [facility], please arrive 15 mins early for check-in and test prep.  There is spacious parking and easy access to the radiology department from the [facility] Heart and Vascular entrance. Please enter here and check-in with the desk attendant.   Please follow these instructions carefully (unless otherwise directed):  An IV will be required for this test and Nitroglycerin will be given.   On the Night Before the Test: Be sure to Drink plenty of water. Do not consume any caffeinated/decaffeinated beverages or chocolate 12 hours prior to your test. Do not take any antihistamines 12 hours prior to your test.  On the Day of the Test: Drink plenty of water until 1 hour prior to the test. Do not eat any food 1 hour prior to test. You may take your regular medications prior to the test.  Take metoprolol (Lopressor) 50 mg two hours prior to test. (Please hold Metoprolol Succinate 25 mg morning of test) If you take Furosemide/Hydrochlorothiazide/Spironolactone/Chlorthalidone, please HOLD on the morning of the test. Patients who wear a continuous glucose monitor MUST remove the device prior to scanning. FEMALES- please wear underwire-free bra if available, avoid dresses & tight clothing       After the Test: Drink plenty of water. After receiving IV contrast, you may experience a mild flushed feeling. This is normal. On occasion, you may experience a mild rash up to 24 hours after the test. This is not dangerous. If this occurs, you can take Benadryl 25 mg and increase your fluid intake. If you experience trouble breathing, this can be serious. If it is severe call 911 IMMEDIATELY. If it is mild, please call our office.  We will call to schedule your test 2-4 weeks out understanding that some insurance companies will need an authorization prior to the service being performed.   For more information and frequently asked  questions, please visit our website : [email or web address]  For non-scheduling related questions, please contact the cardiac imaging nurse navigator should you have any questions/concerns: Cardiac Imaging Nurse Navigators Direct Office Dial: [phone]   For scheduling needs, including cancellations and rescheduling, please call [place], [phone].

## 2023-07-24 LAB — COMPREHENSIVE METABOLIC PANEL
ALT: 31 [IU]/L (ref 0–32)
AST: 24 [IU]/L (ref 0–40)
Albumin: 4.3 g/dL (ref 3.9–4.9)
Alkaline Phosphatase: 139 [IU]/L — ABNORMAL HIGH (ref 44–121)
BUN/Creatinine Ratio: 25 (ref 12–28)
BUN: 15 mg/dL (ref 8–27)
Bilirubin Total: 0.3 mg/dL (ref 0.0–1.2)
CO2: 26 mmol/L (ref 20–29)
Calcium: 10.4 mg/dL — ABNORMAL HIGH (ref 8.7–10.3)
Chloride: 102 mmol/L (ref 96–106)
Creatinine, Ser: 0.59 mg/dL (ref 0.57–1.00)
Globulin, Total: 2.6 g/dL (ref 1.5–4.5)
Glucose: 114 mg/dL — ABNORMAL HIGH (ref 70–99)
Potassium: 4.8 mmol/L (ref 3.5–5.2)
Sodium: 141 mmol/L (ref 134–144)
Total Protein: 6.9 g/dL (ref 6.0–8.5)
eGFR: 100 mL/min/{1.73_m2} (ref >59)

## 2023-07-24 LAB — MAGNESIUM: Magnesium: 1.8 mg/dL (ref 1.6–2.3)

## 2023-07-24 LAB — CBC
Hematocrit: 41.5 % (ref 34.0–46.6)
Hemoglobin: 13.2 g/dL (ref 11.1–15.9)
MCH: 28.8 pg (ref 26.6–33.0)
MCHC: 31.8 g/dL (ref 31.5–35.7)
MCV: 90 fL (ref 79–97)
Platelets: 317 10*3/uL (ref 150–450)
RBC: 4.59 x10E6/uL (ref 3.77–5.28)
RDW: 13.1 % (ref 11.7–15.4)
WBC: 6 10*3/uL (ref 3.4–10.8)

## 2023-07-24 LAB — TSH: TSH: 0.842 u[IU]/mL (ref 0.450–4.500)

## 2023-07-25 LAB — BRAIN NATRIURETIC PEPTIDE: BNP: 54.3 pg/mL (ref 0.0–100.0)

## 2023-07-29 ENCOUNTER — Telehealth (HOSPITAL_COMMUNITY): Payer: Self-pay | Admitting: *Deleted

## 2023-07-29 NOTE — Telephone Encounter (Signed)
Reaching out to patient to offer assistance regarding upcoming cardiac imaging study; pt verbalizes understanding of appt date/time, parking situation and where to check in, pre-test NPO status and medications ordered, and verified current allergies; name and call back number provided for further questions should they arise Hayley Sharpe RN Navigator Cardiac [facility] [phone] office [phone] cell  

## 2023-07-30 ENCOUNTER — Ambulatory Visit
Admission: RE | Admit: 2023-07-30 | Discharge: 2023-07-30 | Disposition: A | Discharge status: Home / Self Care | Payer: Medicare Other | Source: Ambulatory Visit | Attending: Internal Medicine | Admitting: Internal Medicine

## 2023-07-30 DIAGNOSIS — R072 Precordial pain: Secondary | ICD-10-CM | POA: Insufficient documentation

## 2023-07-30 MED ORDER — IOHEXOL 350 MG/ML SOLN
100.0000 mL | Freq: Once | INTRAVENOUS | Status: AC | PRN
  Administered 2023-07-30: 100 mL via INTRAVENOUS

## 2023-07-30 MED ORDER — SODIUM CHLORIDE 0.9 % IV SOLN: INTRAVENOUS | Status: DC

## 2023-07-30 MED ORDER — NITROGLYCERIN 0.4 MG SL SUBL
0.8000 mg | SUBLINGUAL_TABLET | Freq: Once | SUBLINGUAL | Status: AC
  Administered 2023-07-30: 0.8 mg via SUBLINGUAL

## 2023-07-30 NOTE — Progress Notes (Signed)
Patient tolerated CT well. Drank water after. Vital signs stable encourage to drink water throughout day.Reasons explained and verbalized understanding. Ambulated steady gait.  

## 2023-08-14 ENCOUNTER — Ambulatory Visit: Payer: Medicare Other | Attending: Internal Medicine

## 2023-08-14 DIAGNOSIS — R0602 Shortness of breath: Secondary | ICD-10-CM | POA: Diagnosis present

## 2023-08-14 LAB — ECHOCARDIOGRAM COMPLETE
AR max vel: 2.7 cm2
AV Area VTI: 2.75 cm2
AV Area mean vel: 2.64 cm2
AV Mean grad: 4 mm[Hg]
AV Peak grad: 8.2 mm[Hg]
Ao pk vel: 1.43 m/s
Area-P 1/2: 2.95 cm2
Calc EF: 52.2 %
S' Lateral: 4.1 cm
Single Plane A2C EF: 51.8 %
Single Plane A4C EF: 52.5 %

## 2023-08-17 ENCOUNTER — Other Ambulatory Visit: Payer: Self-pay

## 2023-08-17 DIAGNOSIS — R0602 Shortness of breath: Secondary | ICD-10-CM

## 2023-08-18 ENCOUNTER — Telehealth: Payer: Self-pay | Admitting: Internal Medicine

## 2023-08-18 NOTE — Telephone Encounter (Signed)
 Reviewed results with patient and provided her with number to pulmonary. She was appreciative with no further needs at this time.

## 2023-08-18 NOTE — Telephone Encounter (Signed)
 Patient would like a call back to review echo results again.

## 2023-08-21 ENCOUNTER — Encounter: Payer: Self-pay | Admitting: Pulmonary Disease

## 2023-08-21 ENCOUNTER — Ambulatory Visit: Payer: Medicare Other | Admitting: Pulmonary Disease

## 2023-08-21 VITALS — BP 132/72 | HR 71 | Temp 97.6°F | Ht 62.0 in | Wt 175.2 lb

## 2023-08-21 DIAGNOSIS — J683 Other acute and subacute respiratory conditions due to chemicals, gases, fumes and vapors: Secondary | ICD-10-CM

## 2023-08-21 DIAGNOSIS — K219 Gastro-esophageal reflux disease without esophagitis: Secondary | ICD-10-CM | POA: Diagnosis not present

## 2023-08-21 DIAGNOSIS — R0602 Shortness of breath: Secondary | ICD-10-CM

## 2023-08-21 LAB — NITRIC OXIDE: Nitric Oxide: 24

## 2023-08-21 MED ORDER — AIRSUPRA 90-80 MCG/ACT IN AERO: 2.0000 | INHALATION_SPRAY | RESPIRATORY_TRACT | 1 refills | Status: AC | PRN

## 2023-08-21 NOTE — Progress Notes (Signed)
 Subjective:    Patient ID: Amanda Lyons, female    DOB: 08-02-1956, 68 y.o.   MRN: [id number]  Patient Care Team: Cleotilde Oneil FALCON, MD as PCP - General (Internal Medicine) Maranda Leim DEL, MD as PCP - Cardiology (Cardiology) Theta Bloch, MD (Inactive) as Consulting Physician (Internal Medicine) Cleotilde Barrio, MD (Specialist) Maranda Leim DEL, MD as Consulting Physician (Cardiology) Damian Therisa HERO, MD as Physician Assistant (Endocrinology)  Chief Complaint  Patient presents with   Consult    Shortness of breath on exertion x 3 months. Patient has had cardio workup.     BACKGROUND: Amanda Lyons is a 68 year old lifelong never smoker previously evaluated by Dr. Alm Nett, last seen by Dr. Nett on 05 October 2017.  Dr. Nett was following her for shortness of breath.  HPI Discussed the use of AI scribe software for clinical note transcription with the patient, who gave verbal consent to proceed.  History of Present Illness   The patient, previously under the care of Dr. Nett, who has left our practice, she presents with a chief complaint of shortness of breath that has been progressively worsening. The dyspnea is triggered by minimal exertion such as bending, sweeping, or walking short distances. The patient describes the sensation as a feeling of pressure and heaviness, making it difficult to take a deep breath.  Approximately four weeks prior, the patient's husband had to call EMS due to an episode of severe dyspnea and discomfort. The patient has been under the care of a cardiologist, Dr. Mady, who performed an echocardiogram and a CT scan. The results revealed a slight leaking valve (mitral), which the cardiologist did not consider concerning or the cause of her symptoms.  The patient has a history of bronchiolitis, diagnosed by Dr. Nett, which was attributed to the use of benazepril . The patient was prescribed inhalers for the shortness of breath, but she did not  provide any relief. The patient also reports a nightly deep cough, but does not produce any sputum.  Other working diagnoses by Dr. Nett where mild persistent asthma and physical deconditioning.  The patient has been taking Prilosec for reflux and has a small hiatal hernia.      DATA:  PFTs 10/01/15: poor quality test due to difficulty performing spirometry maneuver. Suggestive of obstruction with significant improvement after bronchodilator challenge CTA chest 09/08/16: no PE, normal. Referred to cardiology.  Echocardiogram 09/23/16: Normal LVEF.  No valvular abnormalities.  Normal right ventricle and right atrium. PFTs 11/10/16:  normal spirometry, low normal to minimally reduced TLC. DLCO normal.   Review of Systems A 10 point review of systems was performed and it is as noted above otherwise negative.   Past Medical History:  Diagnosis Date   Anginal pain (HCC) 12/2016   chest pain last time one year ago   Anxiety    Arthritis    left shoulder   Asthma    Chest pain 11/08/2013   Diabetes mellitus without complication (HCC)    Dyslipidemia 11/08/2013   Dysrhythmia    fast   GERD (gastroesophageal reflux disease)    History of anxiety    had anxiety attacks   History of kidney stones    HNP (herniated nucleus pulposus), lumbar 05/02/2013   Hypertension    Left rotator cuff tear 11/24/2017   Trigeminal neuralgia     Past Surgical History:  Procedure Laterality Date   ABDOMINAL HYSTERECTOMY     BACK SURGERY     BRAIN SURGERY  [phone]  for trigeminal neuralgia   CARDIAC CATHETERIZATION     gamma knife  1999   for trigeminal neuralgia   LEFT HEART CATHETERIZATION WITH CORONARY ANGIOGRAM N/A 10/11/2014   Procedure: LEFT HEART CATHETERIZATION WITH CORONARY ANGIOGRAM;  Surgeon: Victory LELON Claudene DOUGLAS, MD;  Location: [facility] CATH LAB;  Service: Cardiovascular;  Laterality: N/A;   LUMBAR LAMINECTOMY/DECOMPRESSION MICRODISCECTOMY  04/19/2003   Right L5-S1 intralaminar laminotomy for  excision of herniated disk with the operating microscope   LUMBAR LAMINECTOMY/DECOMPRESSION MICRODISCECTOMY Right 05/02/2013   Procedure: Right Lumbar Five Sacral One Microdiskectomy;  Surgeon: Victory DELENA Gunnels, MD;  Location: [facility] NEURO ORS;  Service: Neurosurgery;  Laterality: Right;  LUMBAR LAMINECTOMY/DECOMPRESSION MICRODISCECTOMY 1 LEVEL   SHOULDER ARTHROSCOPY WITH OPEN ROTATOR CUFF REPAIR Right 05/28/2016   Procedure: SHOULDER ARTHROSCOPY WITH OPEN ROTATOR CUFF REPAIR;  Surgeon: Kayla Pinal, MD;  Location: [facility] ORS;  Service: Orthopedics;  Laterality: Right;   SHOULDER ARTHROSCOPY WITH OPEN ROTATOR CUFF REPAIR Left 01/06/2018   Procedure: SHOULDER ARTHROSCOPY WITH  ROTATOR CUFF REPAIR;  Surgeon: Pinal Kayla, MD;  Location: [facility] ORS;  Service: Orthopedics;  Laterality: Left;    Patient Active Problem List   Diagnosis Date Noted   Precordial pain 07/23/2023   Palpitations 07/23/2023   Hyperlipidemia associated with type 2 diabetes mellitus (HCC) 07/23/2023   High alkaline phosphatase 07/28/2018   Grade I diastolic dysfunction 03/30/2018   Fatty liver 03/30/2018   Left rotator cuff tear 11/24/2017   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   Obesity (BMI 30.0-34.9) 07/23/2017   SOB (shortness of breath) 10/06/2016   Impingement syndrome of right shoulder 04/12/2016   Cervical radiculitis 04/09/2016   Chronic right hip pain 10/26/2015   Groin pain, chronic, right 10/26/2015   Chronic bronchitis (HCC) 07/10/2015   Vitamin D  deficiency 07/10/2015   Dyshidrotic eczema 05/30/2015   Type 2 diabetes mellitus (HCC) 05/30/2015   Dysphagia 05/02/2015   Dyslipidemia 11/08/2013   Hypertension    GERD (gastroesophageal reflux disease)    HNP (herniated nucleus pulposus), lumbar 05/02/2013    Family History  Problem Relation Age of Onset   Ovarian cancer Mother        early 23's   Cancer Mother        Ovarian   CVA Father    Heart failure Father    Heart disease Father    Diabetes Father     Hypertension Father    Diabetes Sister    Hypertension Sister    Hypertension Sister    Heart failure Maternal Grandmother    Heart attack Maternal Grandfather     Social History   Tobacco Use   Smoking status: Never   Smokeless tobacco: Never  Substance Use Topics   Alcohol use: No    Allergies  Allergen Reactions   Sulfamethoxazole Nausea And Vomiting   Prednisone  Nausea And Vomiting, Other (See Comments) and Nausea Only   Sulfa Antibiotics Nausea And Vomiting, Other (See Comments) and Nausea Only    Current Meds  Medication Sig   Albuterol -Budesonide (AIRSUPRA ) 90-80 MCG/ACT AERO Inhale 2 puffs into the lungs every 4 (four) hours as needed (Shortness of breath, cough, wheezing).   amLODipine  (NORVASC ) 10 MG tablet Take 10 mg by mouth daily.   aspirin  EC 81 MG EC tablet Take 1 tablet (81 mg total) by mouth daily.   gabapentin  (NEURONTIN ) 400 MG capsule Take 400 mg by mouth at bedtime.   hydrochlorothiazide  (HYDRODIURIL ) 25 MG tablet Take 1 tablet (25 mg total) by mouth daily.   meloxicam  (  MOBIC ) 15 MG tablet Take 15 mg by mouth daily.   metFORMIN  (GLUCOPHAGE -XR) 500 MG 24 hr tablet Take 1 tablet (500 mg total) by mouth daily with lunch.   metoprolol  succinate (TOPROL -XL) 25 MG 24 hr tablet Take 1 tablet (25 mg total) by mouth daily.   olmesartan (BENICAR) 40 MG tablet Take 1 tablet by mouth daily.   omeprazole (PRILOSEC) 40 MG capsule Take 40 mg by mouth daily.    rosuvastatin (CRESTOR) 5 MG tablet Take 1 tablet by mouth daily.   sertraline  (ZOLOFT ) 50 MG tablet TAKE 1 TABLET BY MOUTH EVERY DAY    Immunization History  Administered Date(s) Administered   Influenza,inj,Quad PF,6+ Mos 06/18/2019   Influenza,inj,quad, With Preservative 07/11/2016   PFIZER(Purple Top)SARS-COV-2 Vaccination 11/17/2019, 12/21/2019      Objective:   BP 132/72 (BP Location: Left Arm, Patient Position: Sitting, Cuff Size: Normal)   Pulse 71   Temp 97.6 F (36.4 C) (Temporal)   Ht 5' 2  (1.575 m)   Wt 175 lb 3.2 oz (79.5 kg)   SpO2 95%   BMI 32.04 kg/m   SpO2: 95 %  GENERAL: Well-developed, well-nourished in no acute distress.  No conversational dyspnea. HEAD: Normocephalic, atraumatic.  EYES: Pupils equal, round, reactive to light.  No scleral icterus.  MOUTH: Poor dentition, chipped teeth.  Oral mucosa moist.  No thrush. NECK: Supple. No thyromegaly. Trachea midline. No JVD.  No adenopathy. PULMONARY: Good air entry bilaterally.  No adventitious sounds. CARDIOVASCULAR: S1 and S2. Regular rate and rhythm.  No rubs, murmurs or gallops heard. ABDOMEN: Benign. MUSCULOSKELETAL: No joint deformity, no clubbing, no edema.  NEUROLOGIC: No overt focal deficit, no gait disturbance, speech is fluent. SKIN: Intact,warm,dry. PSYCH: Mood and behavior normal.  Lab Results  Component Value Date   NITRICOXIDE 24 08/21/2023   Ambulatory oxymetry was performed today:  At rest on room air oxygen saturation was 95%, the patient ambulated at a moderate pace, completed 3 laps, O2 nadir 93%, moderate shortness of breath.  Resting heart rate was 72 bpm at maximum for this exercise 90 bpm.       Assessment & Plan:     ICD-10-CM   1. Shortness of breath  R06.02 Nitric oxide     Pulmonary Function Test [facility] Only    2. Gastroesophageal reflux disease without esophagitis  K21.9     3. Reactive airways dysfunction syndrome (HCC)  J68.3 Pulmonary Function Test [facility] Only      Orders Placed This Encounter  Procedures   Nitric oxide    Pulmonary Function Test [facility] Only    Standing Status:   Future    Expected Date:   09/04/2023    Expiration Date:   08/20/2024    Full PFT: includes the following: basic spirometry, spirometry pre & post bronchodilator, diffusion capacity (DLCO), lung volumes:   Full PFT    This test can only be performed at:   [facility]    Meds ordered this encounter  Medications   Albuterol -Budesonide (AIRSUPRA ) 90-80 MCG/ACT AERO    Sig: Inhale 2 puffs  into the lungs every 4 (four) hours as needed (Shortness of breath, cough, wheezing).    Dispense:  10.7 g    Refill:  1   Discussion:    Dyspnea Significant shortness of breath with minimal exertion, requiring EMS intervention four weeks ago. Echocardiogram and CT scan showed mild mitral valve regurgitation, coronary score of 0 and no significant lung pathology. Differential diagnosis includes cardiac and pulmonary etiologies and deconditioning.  Informed about as-needed inhaler use for symptomatic relief, with instructions to rinse mouth after use. - Order pulmonary function tests - Prescribe inhaler for as-needed use - Instruct to rinse mouth after inhaler use - Schedule follow-up in 4-6 weeks  History of bronchiolitis Occasional deep cough at night without sputum production, previously attributed to benazepril  use. Diagnosed as bronchitis of the small tubes. - Monitor for symptom changes or exacerbations  Hiatal Hernia with Reflux Small hiatal hernia, well-managed with Prilosec, effectively controlling reflux symptoms. - Continue Prilosec as prescribed  Follow-up - Schedule follow-up in 4-6 weeks.    Advised if symptoms do not improve or worsen, to please contact office for sooner follow up or seek emergency care.    I spent 45 minutes of dedicated to the care of this patient on the date of this encounter to include pre-visit review of records, face-to-face time with the patient discussing conditions above, post visit ordering of testing, clinical documentation with the electronic health record, making appropriate referrals as documented, and communicating necessary findings to members of the patients care team.   C. Leita Sanders, MD Advanced Bronchoscopy PCCM [facility]    *This note was dictated using voice recognition software/Dragon.  Despite best efforts to proofread, errors can occur which can change the meaning. Any transcriptional errors that result  from this process are unintentional and may not be fully corrected at the time of dictation.

## 2023-08-21 NOTE — Patient Instructions (Signed)
 VISIT SUMMARY:  You came in today because of worsening shortness of breath, which has been happening even with minimal activities like bending or walking short distances. You also mentioned having a deep cough at night and a history of bronchitis. We discussed your recent tests, including an echocardiogram and a CT scan, which showed a slight leaking valve but no major lung issues. You are also managing a small hiatal hernia with Prilosec for reflux.  YOUR PLAN:  -DYSPNEA: Dyspnea means difficulty breathing or shortness of breath. We will conduct pulmonary function tests to better understand your lung function. You have been prescribed an inhaler to use as needed for relief, and it's important to rinse your mouth after using it. We will follow up in 4-6 weeks to see how you are doing.  -PRIOR HISTORY OF BRONCHIOLITIS: Bronchiolitis is an inflammation of the small tubes in your lungs, which can cause coughing. We will monitor your symptoms to see if there are any changes or worsening.  -HIATAL HERNIA: A hiatal hernia occurs when a part of your stomach pushes up through your diaphragm. You are currently managing this condition well with Prilosec, which helps control your reflux symptoms. Continue taking Prilosec as prescribed.  INSTRUCTIONS:  Please schedule a follow-up appointment in 4-6 weeks. We will also be conducting pulmonary function tests to assess your lung function. Make sure to use your inhaler as needed and rinse your mouth after each use.

## 2023-09-21 NOTE — Progress Notes (Deleted)
 Cardiology Clinic Note   Date: 09/21/2023 ID: Amanda Lyons 1956-03-13, MRN [id number]  Primary Cardiologist:  Yvonne Kendall, MD  Chief Complaint   Amanda Lyons is a 68 y.o. female who presents to the clinic today for ***  Patient Profile   Amanda Lyons is followed by Dr. Okey Dupre for the history outlined below.      Past medical history significant for: Chest pain/dyspnea.  LHC 10/11/2014: Normal coronary arteries. Coronary CTA 07/30/2023: Coronary calcium score 0, no evidence of CAD.  Echo 08/14/2023: EF 55 to 60%.  No RWMA.  Normal diastolic parameters.  Normal RV size/function.  Mild MR. Palpitations. Hypertension.  Hyperlipidemia. Lipid panel 01/29/2023: LDL 79, HDL 63, TG 156, total 173. GERD.  T2DM. Trigeminal neuralgia.  In summary, patient was previously followed by Dr. Delton See starting in April 2015 during hospital admission for hypertensive urgency.  In March 2016 she underwent LHC for progressive exertional chest heaviness and dyspnea which showed normal coronary arteries.  Echo in February 2018 showed EF 60 to 65%, no RWMA, Grade I DD.  Stress testing March 2018 showed no ischemia and hypertensive response to stress.  Patient was first seen by Dr. Okey Dupre on 07/23/2023 for episodic palpitations, exertional dyspnea and generalized fatigue.  Palpitations can last from 15 minutes all day.  She questioned palpitations were related to recent return bigeminal neuralgia.  She had been started on oxcarbazepine but had to stop secondary to side effects.  She also reported an episode of stabbing central chest pain that began at rest.  She was evaluated by EMS and en route and her EKG looked normal so she declined transported to ED.  Coronary CTA for further evaluation showed calcium score of 0 and no evidence of CAD.  Echo showed normal LV/RV function as detailed above.     History of Present Illness    Today, patient ***  Chest pain/dyspnea Normal coronary arteries per  angiography March 2016.  Coronary CTA December 2024 showed calcium score of 0 with no evidence of CAD.  Echo January 2025 showed normal LV/RV function with no RWMA.  Normal diastolic parameters, mild MR.  Patient*** -***  Palpitations Patient*** -Continue Toprol.  Hypertension BP today*** -Continue amlodipine, hydralazine, HCTZ, Toprol.  Hyperlipidemia LDL June 2024 79. -Continue rosuvastatin.   ROS: All other systems reviewed and are otherwise negative except as noted in History of Present Illness.  EKGs/Labs Reviewed        07/23/2023: ALT 31; AST 24; BUN 15; Creatinine, Ser 0.59; Potassium 4.8; Sodium 141   07/23/2023: Hemoglobin 13.2; WBC 6.0   07/23/2023: TSH 0.842   07/23/2023: BNP 54.3  ***  Risk Assessment/Calculations    {Does this patient have ATRIAL FIBRILLATION?:[phone]} No BP recorded.  {Refresh Note OR Click here to enter BP  :1}***        Physical Exam    VS:  There were no vitals taken for this visit. , BMI There is no height or weight on file to calculate BMI.  GEN: Well nourished, well developed, in no acute distress. Neck: No JVD or carotid bruits. Cardiac: *** RRR. No murmurs. No rubs or gallops.   Respiratory:  Respirations regular and unlabored. Clear to auscultation without rales, wheezing or rhonchi. GI: Soft, nontender, nondistended. Extremities: Radials/DP/PT 2+ and equal bilaterally. No clubbing or cyanosis. No edema ***  Skin: Warm and dry, no rash. Neuro: Strength intact.  Assessment & Plan   ***  Disposition: ***     {Are  you ordering a CV Procedure (e.g. stress test, cath, DCCV, TEE, etc)?   Press F2        :[id number]}   Signed, Etta Grandchild. Teena Mangus, DNP, NP-C

## 2023-09-23 ENCOUNTER — Ambulatory Visit: Payer: Medicare Other | Admitting: Student

## 2023-10-08 ENCOUNTER — Ambulatory Visit: Payer: Medicare Other

## 2023-10-08 ENCOUNTER — Ambulatory Visit: Payer: Medicare Other | Admitting: Pulmonary Disease
